# Patient Record
Sex: Male | Born: 1935 | Race: White | Hispanic: No | Marital: Married | Smoking: Former smoker
Health system: Southern US, Community
[De-identification: ages and names within clinical notes are randomized; demographics above are authoritative.]

## PROBLEM LIST (undated history)

## (undated) DIAGNOSIS — R0989 Other specified symptoms and signs involving the circulatory and respiratory systems: Secondary | ICD-10-CM

## (undated) DIAGNOSIS — D649 Anemia, unspecified: Secondary | ICD-10-CM

## (undated) DIAGNOSIS — D61818 Other pancytopenia: Secondary | ICD-10-CM

## (undated) DIAGNOSIS — M4800 Spinal stenosis, site unspecified: Secondary | ICD-10-CM

## (undated) DIAGNOSIS — I272 Pulmonary hypertension, unspecified: Secondary | ICD-10-CM

## (undated) DIAGNOSIS — C61 Malignant neoplasm of prostate: Secondary | ICD-10-CM

## (undated) DIAGNOSIS — I471 Supraventricular tachycardia: Secondary | ICD-10-CM

## (undated) DIAGNOSIS — J449 Chronic obstructive pulmonary disease, unspecified: Secondary | ICD-10-CM

## (undated) DIAGNOSIS — K219 Gastro-esophageal reflux disease without esophagitis: Secondary | ICD-10-CM

## (undated) DIAGNOSIS — D472 Monoclonal gammopathy: Secondary | ICD-10-CM

## (undated) DIAGNOSIS — R0609 Other forms of dyspnea: Secondary | ICD-10-CM

## (undated) DIAGNOSIS — E119 Type 2 diabetes mellitus without complications: Secondary | ICD-10-CM

## (undated) DIAGNOSIS — G629 Polyneuropathy, unspecified: Secondary | ICD-10-CM

## (undated) DIAGNOSIS — C9000 Multiple myeloma not having achieved remission: Secondary | ICD-10-CM

## (undated) DIAGNOSIS — K449 Diaphragmatic hernia without obstruction or gangrene: Secondary | ICD-10-CM

## (undated) DIAGNOSIS — N183 Chronic kidney disease, stage 3 (moderate): Secondary | ICD-10-CM

## (undated) DIAGNOSIS — I2510 Atherosclerotic heart disease of native coronary artery without angina pectoris: Secondary | ICD-10-CM

## (undated) DIAGNOSIS — I10 Essential (primary) hypertension: Secondary | ICD-10-CM

## (undated) DIAGNOSIS — M1990 Unspecified osteoarthritis, unspecified site: Secondary | ICD-10-CM

## (undated) HISTORY — PX: LAMINECTOMY: SHX219

## (undated) HISTORY — DX: Other forms of dyspnea: R06.09

## (undated) HISTORY — DX: Other specified symptoms and signs involving the circulatory and respiratory systems: R09.89

## (undated) HISTORY — PX: CORONARY ANGIOPLASTY: SHX604

## (undated) HISTORY — PX: CHOLECYSTECTOMY: SHX55

## (undated) HISTORY — PX: COLON SURGERY: SHX602

## (undated) HISTORY — DX: Other pancytopenia: D61.818

## (undated) HISTORY — DX: Monoclonal gammopathy: D47.2

## (undated) HISTORY — DX: Multiple myeloma not having achieved remission: C90.00

## (undated) HISTORY — PX: JOINT REPLACEMENT: SHX530

---

## 1997-07-17 ENCOUNTER — Other Ambulatory Visit: Admission: RE | Admit: 1997-07-17 | Discharge: 1997-07-17 | Payer: Self-pay | Admitting: *Deleted

## 2000-11-17 ENCOUNTER — Ambulatory Visit (HOSPITAL_COMMUNITY): Admission: RE | Admit: 2000-11-17 | Discharge: 2000-11-17 | Payer: Self-pay | Admitting: Gastroenterology

## 2000-11-17 ENCOUNTER — Encounter: Payer: Self-pay | Admitting: Gastroenterology

## 2000-12-29 ENCOUNTER — Encounter: Payer: Self-pay | Admitting: Gastroenterology

## 2000-12-29 ENCOUNTER — Encounter: Admission: RE | Admit: 2000-12-29 | Discharge: 2000-12-29 | Payer: Self-pay | Admitting: Gastroenterology

## 2001-02-08 ENCOUNTER — Emergency Department (HOSPITAL_COMMUNITY): Admission: EM | Admit: 2001-02-08 | Discharge: 2001-02-08 | Payer: Self-pay | Admitting: Emergency Medicine

## 2002-08-23 ENCOUNTER — Ambulatory Visit (HOSPITAL_COMMUNITY): Admission: RE | Admit: 2002-08-23 | Discharge: 2002-08-23 | Payer: Self-pay | Admitting: Neurology

## 2002-08-23 ENCOUNTER — Encounter: Payer: Self-pay | Admitting: Neurology

## 2004-08-08 ENCOUNTER — Ambulatory Visit: Payer: Self-pay | Admitting: Internal Medicine

## 2004-09-06 ENCOUNTER — Ambulatory Visit: Payer: Self-pay | Admitting: Internal Medicine

## 2004-09-09 ENCOUNTER — Ambulatory Visit: Payer: Self-pay | Admitting: Internal Medicine

## 2007-08-05 ENCOUNTER — Ambulatory Visit (HOSPITAL_COMMUNITY): Admission: RE | Admit: 2007-08-05 | Discharge: 2007-08-05 | Payer: Self-pay | Admitting: Internal Medicine

## 2007-08-25 ENCOUNTER — Ambulatory Visit: Payer: Self-pay | Admitting: Oncology

## 2007-09-06 LAB — CBC & DIFF AND RETIC
Eosinophils Absolute: 0.1 10*3/uL (ref 0.0–0.5)
HCT: 31.4 % — ABNORMAL LOW (ref 38.7–49.9)
IRF: 0.42 — ABNORMAL HIGH (ref 0.070–0.380)
LYMPH%: 30.9 % (ref 14.0–48.0)
MONO#: 0.5 10*3/uL (ref 0.1–0.9)
NEUT#: 3.4 10*3/uL (ref 1.5–6.5)
NEUT%: 59.1 % (ref 40.0–75.0)
Platelets: 208 10*3/uL (ref 145–400)
Retic %: 2.5 % — ABNORMAL HIGH (ref 0.7–2.3)
WBC: 5.8 10*3/uL (ref 4.0–10.0)
lymph#: 1.8 10*3/uL (ref 0.9–3.3)

## 2007-09-23 ENCOUNTER — Other Ambulatory Visit: Admission: RE | Admit: 2007-09-23 | Discharge: 2007-09-23 | Payer: Self-pay | Admitting: Oncology

## 2007-09-23 ENCOUNTER — Encounter: Payer: Self-pay | Admitting: Oncology

## 2007-09-23 ENCOUNTER — Ambulatory Visit: Payer: Self-pay | Admitting: Oncology

## 2008-07-19 DIAGNOSIS — R0609 Other forms of dyspnea: Secondary | ICD-10-CM

## 2008-07-19 DIAGNOSIS — I2510 Atherosclerotic heart disease of native coronary artery without angina pectoris: Secondary | ICD-10-CM | POA: Insufficient documentation

## 2008-07-19 DIAGNOSIS — R0989 Other specified symptoms and signs involving the circulatory and respiratory systems: Secondary | ICD-10-CM

## 2008-07-19 DIAGNOSIS — E119 Type 2 diabetes mellitus without complications: Secondary | ICD-10-CM | POA: Insufficient documentation

## 2008-07-19 DIAGNOSIS — C61 Malignant neoplasm of prostate: Secondary | ICD-10-CM | POA: Insufficient documentation

## 2008-07-19 DIAGNOSIS — I10 Essential (primary) hypertension: Secondary | ICD-10-CM

## 2008-07-19 DIAGNOSIS — IMO0002 Reserved for concepts with insufficient information to code with codable children: Secondary | ICD-10-CM | POA: Insufficient documentation

## 2008-07-19 HISTORY — DX: Other forms of dyspnea: R06.09

## 2008-07-20 ENCOUNTER — Ambulatory Visit: Payer: Self-pay | Admitting: Internal Medicine

## 2008-08-21 ENCOUNTER — Ambulatory Visit: Payer: Self-pay | Admitting: Internal Medicine

## 2010-01-02 ENCOUNTER — Encounter: Admission: RE | Admit: 2010-01-02 | Discharge: 2010-01-02 | Payer: Self-pay | Admitting: Orthopedic Surgery

## 2010-01-21 ENCOUNTER — Encounter: Admission: RE | Admit: 2010-01-21 | Discharge: 2010-01-21 | Payer: Self-pay | Admitting: Neurology

## 2010-07-22 ENCOUNTER — Encounter: Payer: Self-pay | Admitting: Oncology

## 2010-07-22 ENCOUNTER — Other Ambulatory Visit: Payer: Self-pay | Admitting: Oncology

## 2010-07-22 ENCOUNTER — Encounter (HOSPITAL_BASED_OUTPATIENT_CLINIC_OR_DEPARTMENT_OTHER): Payer: Medicare Other | Admitting: Oncology

## 2010-07-22 DIAGNOSIS — D472 Monoclonal gammopathy: Secondary | ICD-10-CM

## 2010-07-22 DIAGNOSIS — J438 Other emphysema: Secondary | ICD-10-CM

## 2010-07-22 DIAGNOSIS — E119 Type 2 diabetes mellitus without complications: Secondary | ICD-10-CM

## 2010-07-22 DIAGNOSIS — D649 Anemia, unspecified: Secondary | ICD-10-CM

## 2010-07-22 LAB — MORPHOLOGY: PLT EST: ADEQUATE

## 2010-07-22 LAB — CBC & DIFF AND RETIC
BASO%: 0.2 % (ref 0.0–2.0)
Basophils Absolute: 0 10*3/uL (ref 0.0–0.1)
EOS%: 1.1 % (ref 0.0–7.0)
HCT: 37 % — ABNORMAL LOW (ref 38.4–49.9)
HGB: 12.4 g/dL — ABNORMAL LOW (ref 13.0–17.1)
Immature Retic Fract: 12.9 % (ref 0.00–13.40)
LYMPH%: 25.4 % (ref 14.0–49.0)
MCH: 30.8 pg (ref 27.2–33.4)
MCHC: 33.5 g/dL (ref 32.0–36.0)
NEUT%: 68.6 % (ref 39.0–75.0)
Platelets: 183 10*3/uL (ref 140–400)

## 2010-07-23 LAB — ERYTHROPOIETIN: Erythropoietin: 39.2 m[IU]/mL — ABNORMAL HIGH (ref 2.6–34.0)

## 2010-07-23 LAB — IRON AND TIBC
%SAT: 21 % (ref 20–55)
TIBC: 332 ug/dL (ref 215–435)

## 2010-07-24 LAB — IMMUNOFIXATION ELECTROPHORESIS
IgA: 659 mg/dL — ABNORMAL HIGH (ref 68–378)
IgG (Immunoglobin G), Serum: 715 mg/dL (ref 694–1618)
Total Protein, Serum Electrophoresis: 6.7 g/dL (ref 6.0–8.3)

## 2010-07-26 NOTE — Consult Note (Signed)
[facility]  Patient:    Paul Burnett, Paul Burnett Visit Number: [id number] MRN: [id number]          Service Type: EMS Location: ED Attending Physician:  Benny Lennert Dictated by:   Peter M. Swazil[place], M.D. Proc. Date: 02/08/01 Admit Date:  02/08/2001 Discharge Date: 02/08/2001   CC:         Darci Needle, M.D.   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 Patient noted that his heart was racing.  He listened to his heart with a stethoscope and noted a heart rate of 200 that was regular.  He denied any dizziness, shortness of breath or chest pain.  He did have mild sweating.  He has had no history of syncope or near syncope.  Patient does report a history of SVT documented by [facility] monitor in the past.  He was started on bisoprolol six years ago after it was noted that he had a short burst of nonsustained ventricular tachycardia while he was hospitalized for prostate surgery.  Patient has had a history of coronary artery disease and has had previous angioplasty x 2, the last reported 10 years ago.  He had a negative stress Cardiolite two weeks ago.  PAST MEDICAL HISTORY:  Past medical history is significant for diabetes mellitus, type 2, hypertension and hypercholesterolemia.  PAST SURGICAL HISTORY:  He has had a prior prostatectomy for prostate CA.  CURRENT MEDICATIONS: 1. Bisoprolol 5 mg daily. 2. Multivitamin. 3. Vitamin E daily. 4. B12 folate and B6 daily. 5. Selenium daily. 6. A statin drug daily. 7. Cozaar daily.  ALLERGIES:  No known allergies.  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  He is a nonsmoker.  He avoids decongestants and caffeine.  He is married and his son Reita Cliche is an orthopedist here.  FAMILY HISTORY:   Noncontributory.  REVIEW OF SYSTEMS:  Patient does note some muscle weakness.  No history of TIA or stroke.  No known history of atrial fibrillation.  PHYSICAL EXAMINATION:  GENERAL:  Patient is a pleasant white male in no apparent distress.  VITAL SIGNS:  Blood pressure is 126/55.  Pulse ranges from 80 to 120. Respirations are 20.  He is afebrile.  HEENT:  Unremarkable.  NECK:  He has no JVD or bruits.  LUNGS:  Clear.  CARDIAC:  Regular rate and rhythm without gallops, murmurs or clicks.  ABDOMEN:  Soft and nontender without masses or hepatosplenomegaly.  EXTREMITIES:  Pulses are 2+ and symmetric.  He has no edema.  LABORATORY DATA:  CBC shows a white count of 6,700, hemoglobin 13, hematocrit 36.3, platelets 144,000.  Review of ECG and rhythm strips demonstrates normal sinus rhythm with frequent PACs and runs of ectopic atrial tachycardia.  It is associated with a different P wave morphology and more prolonged P-R segment.  Heart rate with ectopic atrial tachycardia typically runs 120 to 130.  Patient also has bifascicular block with left anterior fascicular block and right bundle branch block which is chronic.  IMPRESSION: 1. Ectopic atrial tachycardia. 2. Prior history of supraventricular tachycardia. 3. Coronary artery disease, status post angioplasty, with recent negative    Cardiolite study. 4. Hypertension. 5. Diabetes mellitus. 6. Hypercholesterolemia. 7. Chronic bifascicular block.     PLAN:  We will obtain TSH and BMET.  I have recommended increasing  his bisoprolol to 5 mg b.i.d.  I will follow up with Dr. Darci Needle p.r.n. Dictated by:   Peter M. Swaziland, M.D. Attending Physician:  Benny Lennert DD:  02/09/99 TD:  02/09/01 Job: [id number]

## 2010-11-13 ENCOUNTER — Other Ambulatory Visit (HOSPITAL_COMMUNITY): Payer: Self-pay | Admitting: Orthopedic Surgery

## 2010-11-13 ENCOUNTER — Ambulatory Visit (HOSPITAL_COMMUNITY)
Admission: RE | Admit: 2010-11-13 | Discharge: 2010-11-13 | Disposition: A | Discharge status: Home / Self Care | Payer: Medicare Other | Source: Ambulatory Visit | Attending: Orthopedic Surgery | Admitting: Orthopedic Surgery

## 2010-11-13 ENCOUNTER — Encounter (HOSPITAL_COMMUNITY)
Admission: RE | Admit: 2010-11-13 | Discharge: 2010-11-13 | Disposition: A | Discharge status: Home / Self Care | Payer: Medicare Other | Source: Ambulatory Visit | Attending: Orthopedic Surgery | Admitting: Orthopedic Surgery

## 2010-11-13 DIAGNOSIS — J4489 Other specified chronic obstructive pulmonary disease: Secondary | ICD-10-CM | POA: Insufficient documentation

## 2010-11-13 DIAGNOSIS — Z01812 Encounter for preprocedural laboratory examination: Secondary | ICD-10-CM | POA: Insufficient documentation

## 2010-11-13 DIAGNOSIS — M08951 Juvenile arthritis, unspecified, right hip: Secondary | ICD-10-CM

## 2010-11-13 DIAGNOSIS — J449 Chronic obstructive pulmonary disease, unspecified: Secondary | ICD-10-CM | POA: Insufficient documentation

## 2010-11-13 DIAGNOSIS — Z01818 Encounter for other preprocedural examination: Secondary | ICD-10-CM | POA: Insufficient documentation

## 2010-11-13 DIAGNOSIS — I517 Cardiomegaly: Secondary | ICD-10-CM | POA: Insufficient documentation

## 2010-11-13 LAB — URINALYSIS, ROUTINE W REFLEX MICROSCOPIC
Bilirubin Urine: NEGATIVE
Ketones, ur: NEGATIVE mg/dL
Nitrite: NEGATIVE
Urobilinogen, UA: 0.2 mg/dL (ref 0.0–1.0)

## 2010-11-13 LAB — COMPREHENSIVE METABOLIC PANEL
AST: 20 U/L (ref 0–37)
Alkaline Phosphatase: 159 U/L — ABNORMAL HIGH (ref 39–117)
BUN: 27 mg/dL — ABNORMAL HIGH (ref 6–23)
CO2: 29 mEq/L (ref 19–32)
Chloride: 102 mEq/L (ref 96–112)
Creatinine, Ser: 1.31 mg/dL (ref 0.50–1.35)
GFR calc non Af Amer: 52 mL/min — ABNORMAL LOW (ref >60)
Potassium: 4.1 mEq/L (ref 3.5–5.1)
Total Bilirubin: 0.8 mg/dL (ref 0.3–1.2)

## 2010-11-13 LAB — CBC
HCT: 39.2 % (ref 39.0–52.0)
MCV: 90.5 fL (ref 78.0–100.0)
Platelets: 169 10*3/uL (ref 150–400)
RBC: 4.33 MIL/uL (ref 4.22–5.81)
WBC: 6.2 10*3/uL (ref 4.0–10.5)

## 2010-11-13 LAB — ABO/RH: ABO/RH(D): AB POS

## 2010-11-13 LAB — APTT: aPTT: 29 seconds (ref 24–37)

## 2010-11-13 LAB — SURGICAL PCR SCREEN: Staphylococcus aureus: NEGATIVE

## 2010-11-18 ENCOUNTER — Inpatient Hospital Stay (HOSPITAL_COMMUNITY)
Admission: RE | Admit: 2010-11-18 | Discharge: 2010-11-20 | DRG: 470 | Disposition: A | Discharge status: Rehabilitation Facility | Payer: Medicare Other | Source: Ambulatory Visit | Attending: Orthopedic Surgery | Admitting: Orthopedic Surgery

## 2010-11-18 ENCOUNTER — Inpatient Hospital Stay (HOSPITAL_COMMUNITY): Payer: Medicare Other

## 2010-11-18 DIAGNOSIS — Z8546 Personal history of malignant neoplasm of prostate: Secondary | ICD-10-CM

## 2010-11-18 DIAGNOSIS — Z9861 Coronary angioplasty status: Secondary | ICD-10-CM

## 2010-11-18 DIAGNOSIS — I2510 Atherosclerotic heart disease of native coronary artery without angina pectoris: Secondary | ICD-10-CM | POA: Diagnosis present

## 2010-11-18 DIAGNOSIS — J4489 Other specified chronic obstructive pulmonary disease: Secondary | ICD-10-CM | POA: Diagnosis present

## 2010-11-18 DIAGNOSIS — N189 Chronic kidney disease, unspecified: Secondary | ICD-10-CM | POA: Diagnosis present

## 2010-11-18 DIAGNOSIS — M1610 Unilateral primary osteoarthritis, unspecified hip: Principal | ICD-10-CM | POA: Diagnosis present

## 2010-11-18 DIAGNOSIS — E119 Type 2 diabetes mellitus without complications: Secondary | ICD-10-CM | POA: Diagnosis present

## 2010-11-18 DIAGNOSIS — J449 Chronic obstructive pulmonary disease, unspecified: Secondary | ICD-10-CM | POA: Diagnosis present

## 2010-11-18 DIAGNOSIS — M169 Osteoarthritis of hip, unspecified: Principal | ICD-10-CM | POA: Diagnosis present

## 2010-11-18 HISTORY — PX: TOTAL HIP ARTHROPLASTY: SHX124

## 2010-11-18 LAB — GLUCOSE, CAPILLARY
Glucose-Capillary: 141 mg/dL — ABNORMAL HIGH (ref 70–99)
Glucose-Capillary: 166 mg/dL — ABNORMAL HIGH (ref 70–99)

## 2010-11-19 ENCOUNTER — Inpatient Hospital Stay (HOSPITAL_COMMUNITY): Payer: Medicare Other

## 2010-11-19 DIAGNOSIS — Z96649 Presence of unspecified artificial hip joint: Secondary | ICD-10-CM

## 2010-11-19 DIAGNOSIS — M1610 Unilateral primary osteoarthritis, unspecified hip: Secondary | ICD-10-CM

## 2010-11-19 LAB — GLUCOSE, CAPILLARY: Glucose-Capillary: 162 mg/dL — ABNORMAL HIGH (ref 70–99)

## 2010-11-19 LAB — CBC
HCT: 29.9 % — ABNORMAL LOW (ref 39.0–52.0)
Hemoglobin: 10.4 g/dL — ABNORMAL LOW (ref 13.0–17.0)
MCH: 30.9 pg (ref 26.0–34.0)
MCHC: 34.8 g/dL (ref 30.0–36.0)
MCV: 88.7 fL (ref 78.0–100.0)
RDW: 13.2 % (ref 11.5–15.5)

## 2010-11-19 LAB — BASIC METABOLIC PANEL
BUN: 17 mg/dL (ref 6–23)
Calcium: 8.8 mg/dL (ref 8.4–10.5)
Creatinine, Ser: 1.07 mg/dL (ref 0.50–1.35)
GFR calc Af Amer: >60 mL/min (ref >60)
GFR calc non Af Amer: >60 mL/min (ref >60)
Glucose, Bld: 173 mg/dL — ABNORMAL HIGH (ref 70–99)
Potassium: 3.7 mEq/L (ref 3.5–5.1)

## 2010-11-19 NOTE — Op Note (Signed)
NAME:  Paul Burnett, Paul Burnett NO.:  [id number]  MEDICAL RECORD NO.:  [id number]  LOCATION:  5025                         FACILITY:  [facility]  PHYSICIAN:  Loreta Ave, M.D. DATE OF BIRTH:  02/05/1925  DATE OF PROCEDURE:  11/18/2010 DATE OF DISCHARGE:                              OPERATIVE REPORT   PREOPERATIVE DIAGNOSIS:  Right hip end-stage degenerative arthritis with shortening and marked bony destruction femoral head.  POSTOPERATIVE DIAGNOSIS:  Right hip end-stage degenerative arthritis with shortening and marked bony destruction femoral head.  PROCEDURE:  Right total hip replacement, Stryker Secur-Fit plus prosthesis.  Press-Fit 56-mm acetabular component screw fixation x2.  40 mm internal diameter polyethylene liner.  A Press-Fit #8 Secur-Fit plus stem.  127-degree neck angle.  840-mm metallic head +7.5 mm.  SURGEON:  Loreta Ave, MD  ASSISTANT:  Zonia Kief, PA, present throughout the entire case necessary for timely completion of procedure.  ANESTHESIA:  General.  BLOOD LOSS:  100 mL.  BLOOD GIVEN:  None.  SPECIMENS:  None.  CULTURES:  None.  COMPLICATIONS:  None.  DRESSING:  Soft compressive with abduction pillow.  DESCRIPTION OF PROCEDURE:  The patient was brought to the operating room, placed on the operating table in supine position.  After adequate anesthesia has been obtained, turned to a lateral position.  Appropriate padding and support.  Right hip had virtually no rotation, at all very little flexion and abduction.  Prepped and draped in usual sterile fashion.  Incision along the shaft of femur extending posterosuperior. Skin and subcutaneous tissue divided.  Iliotibial band exposed and incised.  Charnley retractor put in place.  Neurovascular structures identified and protected.  External rotator capsule taken down off the back of the intertrochanteric groove.  Hip dislocated posteriorly. Femoral neck cut one fingerbreadth above  the lesser trochanter.  Femoral head removed.  Marked destruction collapse.  Reasonable bone stock, however.  Acetabulum exposed.  Labrum debrided.  Brought up bleeding bone.  Fitted with a 56-mm component at 45 degrees of abduction, 20 degrees of anteversion.  Good capturing fixation augmented with two screws through the cuff.  40-mm internal diameter liner was seated and inserted.  Attention turned to the femur.  Proximal and distal reamers brought up to good fitting.  Utilizing #8 prosthesis.  After appropriate trials, #8 prosthesis was placed, restoring as much anteversion as possible.  With a 40-mm +7.5 head, I had good restoration of leg lengths.  Excellent stability in flexion and extension, but he still was very stiff because of shortening from collapse.  Wound was irrigated. External rotator cuff repaired of the back of the intertrochanteric groove and tied over bony bridge.  An AP x-ray was obtained which showed a good leg lengths.  The iliotibial band closed with Vicryl.  Skin and subcutaneous tissue with staples.  Margins were injected with Marcaine. Sterile compressive dressing applied.  Returned to supine position. Anesthesia reversed.  Brought to the recovery room.  Tolerated surgery well.  No complications.     Loreta Ave, M.D.     DFM/MEDQ  D:  11/18/2010  T:  11/18/2010  Job:  [id number]  Electronically Signed by Mckinley Jewel M.D. on  11/19/2010 02:47:54 PM

## 2010-11-19 NOTE — Op Note (Signed)
  NAME:  Paul Burnett, Paul Burnett NO.:  [id number]  MEDICAL RECORD NO.:  [id number]  LOCATION:  5025                         FACILITY:  [facility]  PHYSICIAN:  Trenae Brunke I. Patsi Sears, M.D.DATE OF BIRTH:  December 22, 1924  DATE OF PROCEDURE:  11/18/2010 DATE OF DISCHARGE:                              OPERATIVE REPORT   PREOPERATIVE DIAGNOSIS:  Prostate cancer with a history of urethral stricture disease.  POSTOPERATIVE DIAGNOSIS:  Prostate cancer with a history of urethral stricture disease.  OPERATION:  Foley catheterization.  SURGEON:  Kashana Breach I. Patsi Sears, MD  ANESTHESIA:  General.  PREPARATION:  With the patient in the supine position, the penis was prepped with Betadine solution, draped in usual fashion.  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  However, he has some difficulty voiding, has been treated with Flomax.  He is now for knee replacement, and has a history of difficult Foley catheterization.  DESCRIPTION OF PROCEDURE:  Following prepping and draping of the penis, the penis was again prepped with Betadine solution as a second prep, and then 20 mL of Xylocaine jelly was placed in the urethra.  I was then able to take a 14 catheter, and a guide the catheter through the urethra, into the bladder.  I was able to recover urine which was clear in nature.  A 10 mL were placed in the Foley balloon without difficulty.  IMPRESSION:  Atraumatic Foley catheter placed in the operating room.  PLAN:  Continue Flomax, and call if the patient has difficulty voiding postop.     Montoya Watkin I. Patsi Sears, M.D.     SIT/MEDQ  D:  11/18/2010  T:  11/18/2010  Job:  [id number]  Electronically Signed by Jethro Bolus M.D. on 11/19/2010 04:58:02 PM

## 2010-11-20 ENCOUNTER — Inpatient Hospital Stay (HOSPITAL_COMMUNITY)
Admission: RE | Admit: 2010-11-20 | Discharge: 2010-11-29 | DRG: 945 | Disposition: A | Discharge status: Home Health | Payer: Medicare Other | Source: Other Acute Inpatient Hospital | Attending: Physical Medicine & Rehabilitation | Admitting: Physical Medicine & Rehabilitation

## 2010-11-20 DIAGNOSIS — N189 Chronic kidney disease, unspecified: Secondary | ICD-10-CM | POA: Diagnosis present

## 2010-11-20 DIAGNOSIS — J4489 Other specified chronic obstructive pulmonary disease: Secondary | ICD-10-CM | POA: Diagnosis present

## 2010-11-20 DIAGNOSIS — Z8546 Personal history of malignant neoplasm of prostate: Secondary | ICD-10-CM

## 2010-11-20 DIAGNOSIS — I959 Hypotension, unspecified: Secondary | ICD-10-CM | POA: Diagnosis not present

## 2010-11-20 DIAGNOSIS — K5900 Constipation, unspecified: Secondary | ICD-10-CM | POA: Diagnosis not present

## 2010-11-20 DIAGNOSIS — E785 Hyperlipidemia, unspecified: Secondary | ICD-10-CM | POA: Diagnosis present

## 2010-11-20 DIAGNOSIS — M169 Osteoarthritis of hip, unspecified: Secondary | ICD-10-CM | POA: Diagnosis present

## 2010-11-20 DIAGNOSIS — Z886 Allergy status to analgesic agent status: Secondary | ICD-10-CM

## 2010-11-20 DIAGNOSIS — Z87891 Personal history of nicotine dependence: Secondary | ICD-10-CM

## 2010-11-20 DIAGNOSIS — Z8719 Personal history of other diseases of the digestive system: Secondary | ICD-10-CM

## 2010-11-20 DIAGNOSIS — J189 Pneumonia, unspecified organism: Secondary | ICD-10-CM | POA: Diagnosis not present

## 2010-11-20 DIAGNOSIS — I129 Hypertensive chronic kidney disease with stage 1 through stage 4 chronic kidney disease, or unspecified chronic kidney disease: Secondary | ICD-10-CM | POA: Diagnosis present

## 2010-11-20 DIAGNOSIS — E119 Type 2 diabetes mellitus without complications: Secondary | ICD-10-CM | POA: Diagnosis present

## 2010-11-20 DIAGNOSIS — Z5189 Encounter for other specified aftercare: Principal | ICD-10-CM

## 2010-11-20 DIAGNOSIS — J449 Chronic obstructive pulmonary disease, unspecified: Secondary | ICD-10-CM | POA: Diagnosis present

## 2010-11-20 DIAGNOSIS — I2510 Atherosclerotic heart disease of native coronary artery without angina pectoris: Secondary | ICD-10-CM | POA: Diagnosis present

## 2010-11-20 DIAGNOSIS — Z7982 Long term (current) use of aspirin: Secondary | ICD-10-CM

## 2010-11-20 DIAGNOSIS — M1610 Unilateral primary osteoarthritis, unspecified hip: Secondary | ICD-10-CM | POA: Diagnosis present

## 2010-11-20 DIAGNOSIS — R339 Retention of urine, unspecified: Secondary | ICD-10-CM | POA: Diagnosis present

## 2010-11-20 DIAGNOSIS — Z9861 Coronary angioplasty status: Secondary | ICD-10-CM

## 2010-11-20 DIAGNOSIS — D649 Anemia, unspecified: Secondary | ICD-10-CM | POA: Diagnosis present

## 2010-11-20 LAB — CBC
Hemoglobin: 9.9 g/dL — ABNORMAL LOW (ref 13.0–17.0)
MCH: 31.2 pg (ref 26.0–34.0)
MCV: 89.6 fL (ref 78.0–100.0)
Platelets: 129 10*3/uL — ABNORMAL LOW (ref 150–400)
RBC: 3.17 MIL/uL — ABNORMAL LOW (ref 4.22–5.81)
WBC: 7 10*3/uL (ref 4.0–10.5)

## 2010-11-20 LAB — BASIC METABOLIC PANEL
CO2: 29 mEq/L (ref 19–32)
Calcium: 8.4 mg/dL (ref 8.4–10.5)
Chloride: 104 mEq/L (ref 96–112)
Creatinine, Ser: 1.25 mg/dL (ref 0.50–1.35)
Glucose, Bld: 134 mg/dL — ABNORMAL HIGH (ref 70–99)

## 2010-11-20 LAB — GLUCOSE, CAPILLARY
Glucose-Capillary: 132 mg/dL — ABNORMAL HIGH (ref 70–99)
Glucose-Capillary: 123 mg/dL — ABNORMAL HIGH (ref 70–99)

## 2010-11-21 DIAGNOSIS — Z96649 Presence of unspecified artificial hip joint: Secondary | ICD-10-CM

## 2010-11-21 DIAGNOSIS — M1610 Unilateral primary osteoarthritis, unspecified hip: Secondary | ICD-10-CM

## 2010-11-21 DIAGNOSIS — I2510 Atherosclerotic heart disease of native coronary artery without angina pectoris: Secondary | ICD-10-CM

## 2010-11-21 DIAGNOSIS — D62 Acute posthemorrhagic anemia: Secondary | ICD-10-CM

## 2010-11-21 LAB — GLUCOSE, CAPILLARY: Glucose-Capillary: 149 mg/dL — ABNORMAL HIGH (ref 70–99)

## 2010-11-22 ENCOUNTER — Inpatient Hospital Stay (HOSPITAL_COMMUNITY): Payer: Medicare Other

## 2010-11-22 DIAGNOSIS — M1610 Unilateral primary osteoarthritis, unspecified hip: Secondary | ICD-10-CM

## 2010-11-22 DIAGNOSIS — Z96649 Presence of unspecified artificial hip joint: Secondary | ICD-10-CM

## 2010-11-22 DIAGNOSIS — I2510 Atherosclerotic heart disease of native coronary artery without angina pectoris: Secondary | ICD-10-CM

## 2010-11-22 DIAGNOSIS — D62 Acute posthemorrhagic anemia: Secondary | ICD-10-CM

## 2010-11-22 LAB — COMPREHENSIVE METABOLIC PANEL
ALT: 6 U/L (ref 0–53)
Albumin: 2.4 g/dL — ABNORMAL LOW (ref 3.5–5.2)
Alkaline Phosphatase: 99 U/L (ref 39–117)
GFR calc Af Amer: >60 mL/min (ref >60)
Glucose, Bld: 121 mg/dL — ABNORMAL HIGH (ref 70–99)
Potassium: 3.5 mEq/L (ref 3.5–5.1)
Sodium: 139 mEq/L (ref 135–145)
Total Protein: 5.8 g/dL — ABNORMAL LOW (ref 6.0–8.3)

## 2010-11-22 LAB — URINALYSIS, ROUTINE W REFLEX MICROSCOPIC
Glucose, UA: NEGATIVE mg/dL
Specific Gravity, Urine: 1.02 (ref 1.005–1.030)
pH: 5 (ref 5.0–8.0)

## 2010-11-22 LAB — DIFFERENTIAL
Basophils Absolute: 0 10*3/uL (ref 0.0–0.1)
Basophils Relative: 0 % (ref 0–1)
Monocytes Absolute: 0.4 10*3/uL (ref 0.1–1.0)
Neutro Abs: 3.5 10*3/uL (ref 1.7–7.7)
Neutrophils Relative %: 65 % (ref 43–77)

## 2010-11-22 LAB — URINE MICROSCOPIC-ADD ON

## 2010-11-22 LAB — CBC
Hemoglobin: 9 g/dL — ABNORMAL LOW (ref 13.0–17.0)
MCHC: 34.7 g/dL (ref 30.0–36.0)

## 2010-11-23 LAB — CBC
HCT: 27.7 % — ABNORMAL LOW (ref 39.0–52.0)
MCHC: 35 g/dL (ref 30.0–36.0)
MCV: 89.1 fL (ref 78.0–100.0)
RDW: 13.5 % (ref 11.5–15.5)

## 2010-11-23 LAB — URINE CULTURE
Culture  Setup Time: 201209141714
Culture: NO GROWTH

## 2010-11-24 LAB — MAGNESIUM: Magnesium: 2.1 mg/dL (ref 1.5–2.5)

## 2010-11-24 LAB — BASIC METABOLIC PANEL
CO2: 28 mEq/L (ref 19–32)
Calcium: 8.7 mg/dL (ref 8.4–10.5)
Chloride: 97 mEq/L (ref 96–112)
GFR calc Af Amer: 51 mL/min — ABNORMAL LOW (ref >60)
Sodium: 137 mEq/L (ref 135–145)

## 2010-11-24 LAB — GLUCOSE, CAPILLARY: Glucose-Capillary: 109 mg/dL — ABNORMAL HIGH (ref 70–99)

## 2010-11-25 ENCOUNTER — Inpatient Hospital Stay (HOSPITAL_COMMUNITY): Payer: Medicare Other

## 2010-11-25 DIAGNOSIS — I2510 Atherosclerotic heart disease of native coronary artery without angina pectoris: Secondary | ICD-10-CM

## 2010-11-25 DIAGNOSIS — D62 Acute posthemorrhagic anemia: Secondary | ICD-10-CM

## 2010-11-25 DIAGNOSIS — Z96649 Presence of unspecified artificial hip joint: Secondary | ICD-10-CM

## 2010-11-25 DIAGNOSIS — M1610 Unilateral primary osteoarthritis, unspecified hip: Secondary | ICD-10-CM

## 2010-11-25 LAB — BASIC METABOLIC PANEL
BUN: 29 mg/dL — ABNORMAL HIGH (ref 6–23)
Calcium: 9.1 mg/dL (ref 8.4–10.5)
Chloride: 102 mEq/L (ref 96–112)
Creatinine, Ser: 1.11 mg/dL (ref 0.50–1.35)
GFR calc Af Amer: >60 mL/min (ref >60)
GFR calc non Af Amer: >60 mL/min (ref >60)

## 2010-11-25 LAB — GLUCOSE, CAPILLARY: Glucose-Capillary: 124 mg/dL — ABNORMAL HIGH (ref 70–99)

## 2010-11-25 LAB — CBC
Hemoglobin: 8.9 g/dL — ABNORMAL LOW (ref 13.0–17.0)
MCH: 31.1 pg (ref 26.0–34.0)
MCHC: 34.6 g/dL (ref 30.0–36.0)
Platelets: 202 10*3/uL (ref 150–400)
RDW: 13.3 % (ref 11.5–15.5)

## 2010-11-26 LAB — GLUCOSE, CAPILLARY: Glucose-Capillary: 111 mg/dL — ABNORMAL HIGH (ref 70–99)

## 2010-11-27 LAB — CBC
HCT: 28.8 % — ABNORMAL LOW (ref 39.0–52.0)
Hemoglobin: 9.7 g/dL — ABNORMAL LOW (ref 13.0–17.0)
MCH: 30.6 pg (ref 26.0–34.0)
MCHC: 33.7 g/dL (ref 30.0–36.0)
MCV: 90.9 fL (ref 78.0–100.0)
Platelets: 260 K/uL (ref 150–400)
RBC: 3.17 MIL/uL — ABNORMAL LOW (ref 4.22–5.81)
RDW: 13.3 % (ref 11.5–15.5)
WBC: 7 K/uL (ref 4.0–10.5)

## 2010-11-27 LAB — BASIC METABOLIC PANEL WITH GFR
BUN: 25 mg/dL — ABNORMAL HIGH (ref 6–23)
CO2: 30 meq/L (ref 19–32)
Calcium: 9.4 mg/dL (ref 8.4–10.5)
Chloride: 101 meq/L (ref 96–112)
Creatinine, Ser: 1.22 mg/dL (ref 0.50–1.35)
GFR calc Af Amer: >60 mL/min (ref >60)
GFR calc non Af Amer: 57 mL/min — ABNORMAL LOW (ref >60)
Glucose, Bld: 113 mg/dL — ABNORMAL HIGH (ref 70–99)
Potassium: 4.3 meq/L (ref 3.5–5.1)
Sodium: 140 meq/L (ref 135–145)

## 2010-11-27 LAB — GLUCOSE, CAPILLARY: Glucose-Capillary: 116 mg/dL — ABNORMAL HIGH (ref 70–99)

## 2010-11-28 LAB — GLUCOSE, CAPILLARY: Glucose-Capillary: 113 mg/dL — ABNORMAL HIGH (ref 70–99)

## 2010-11-28 NOTE — H&P (Signed)
NAME:  Paul Burnett, Paul Burnett NO.:  [id number]  MEDICAL RECORD NO.:  [id number]  LOCATION:  4011                         FACILITY:  [facility]  PHYSICIAN:  Ranelle Oyster, M.D.DATE OF BIRTH:  13-Aug-1924  DATE OF ADMISSION:  11/20/2010 DATE OF DISCHARGE:                             HISTORY & PHYSICAL   CHIEF COMPLAINTS:  Right hip pain.  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  He underwent a right total hip replacement on November 18, 2010, by Dr. Eulah Pont.  He is weightbearing as tolerated with posterior hip precautions.  Subcu Lovenox was placed for DVT prophylaxis. Postoperative pain control has been an issue.  Medications have been reduced a bit due to postoperative confusion and the patient has experienced some improvement.  He did develop postoperative anemia at 9.9 which is being followed.  The patient has struggled with his mobility and some safety issues.  Rehab was consulted and felt he could benefit from intensive inpatient rehab stay.  REVIEW OF SYSTEMS:  Notable for mild tremor, right hip pain, and angina. He has had occasional cough.  Full written review of systems is in the written H and P.  PAST MEDICAL HISTORY:  Positive for: 1. CAD with PTCA and PSVT. 2. History of non-insulin-requiring diabetes. 3. Peripheral neuropathy due to chemotherapy. 4. Prostate cancer. 5. Hypertension. 6. Intermittent tremor. 7. COPD. 8. Hyperlipidemia. 9. Chronic renal insufficiency with creatinine baseline 1.3. 10.Spinal stenosis with chronic radiculopathy. 11.Monoclonal gammopathy. 12.Duodenal ulcer. 13.Remote tobacco use.  He does not drink.  FAMILY HISTORY:  Positive for CAD.  SOCIAL  HISTORY:  The patient is living with his wife who is very limited and has a Comptroller 6 hours day.  Son is a Programmer, multimedia.  They have a 1- level house with 5 steps to enter.  Help is available as needed at home.  ALLERGIES:  DEMEROL.  HOME MEDICATIONS:  Losartan, atenolol, metformin, Zetia, aspirin 81 mg daily, Prilosec, Flomax, hydrochlorothiazide, vitamin B6 and 12, and vitamin C.  LABORATORY DATA:  Hemoglobin 9.9, white count 7000, and platelets 129. Sodium 139, potassium 3.9, BUN 19, and creatinine 1.25.  PHYSICAL EXAMINATION:  VITAL SIGNS:  Blood pressure 130/73, pulse 62, respiratory rate 18, and temperature 98.9. GENERAL:  The patient is generally pleasant, alert, in no acute distress. HEENT:  Ear, nose, and throat exam is notable for intact dentition, pink moist mucosa.  Pupils are equal, round, and reactive to light. CHEST:  Clear to auscultation bilaterally without any wheezes, rales, or rhonchi.  His air movement was a bit diminished. HEART:  Regular rate and rhythm without murmurs, rubs, or gallops. ABDOMEN:  Nontender with active bowel sounds. SKIN:  Notable for right hip incision which is clean and intact with staples and dressing.  The patient had some diminishment of his sensory function in the distal lower extremities mostly below the ankles, but this was very mild. NEUROLOGICAL:  Cranial nerves  were intact.  Reflexes were 2+.  Judgment, orientation, memory, and mood were all appropriate.  Strength is grossly 4/5 in the upper extremities, proximal and distal.  Right lower extremity is 1-2/5 proximally at the hip and knee with pain inhibition 4/5 distally.  Left lower extremity is 3/5 proximally to 4+/5 distally.  POSTADMISSION PHYSICIAN EVALUATION: 1. Functional deficits secondary to osteoarthritis of the right hip     failing conservative measures.  The patient is status post right     total hip replacement, postoperative day #2 today. 2. The patient is admitted  to receive collaborative interdisciplinary     care between the physiatrist, rehab nursing staff, and therapy     team. 3. The patient's level of medical complexity and substantial therapy     needs in context of that medical necessity cannot be provided at a     lesser intensity of care. 4. The patient has experienced substantial functional loss from his     baseline.  Premorbidly, he had been independent.  Currently, he is     total assist bed mobility, min to mod assist transfers, min assist     27 feet with rolling walker with cues, and max assist lower body     ADLs.  Judging by the patient's diagnosis, physical exam, and     functional history, he has a potential for functional progress     which will result in measurable gains while in inpatient rehab.     These gains will be of substantial and practical use upon discharge     to home in facilitating mobility and self-care.  Interim changes     since our consult are detailed above. 5. The physiatrist will provide 24-hour management of medical needs as     well as oversight of therapy plan/treatment and provide guidance as     appropriate regarding interaction of the two.  Medical problem list     and plan are below. 6. The 24-Hour Rehab Nursing team will assist in management of the     patient's skin care needs as well as pain management, nutrition,     bowel and bladder function, and integration of therapy concepts and     techniques. 7. PT will assess and treat for lower extremity strength, observance     of hip precautions, balance, safety, adaptive equipment, and     functional mobility with goals modified independent. 8. OT will assess and treat for upper extremity use, ADLs, adaptive     techniques, equipment, functional mobility, safety, upper extremity     strength, and caregiver education with goals modified independent     to setup. 9. Case Management and Social Worker will assess and treat for     psychosocial issues  and discharge planning. 10.Team conference will be held weekly to assess progress towards     goals and to determine barriers at discharge. 11.The patient has demonstrated sufficient medical stability and     exercise capacity to tolerate at least 3 hours of therapy per day     at least 5 days per week. 12.Estimated length stay is approximately 7-10 days.  Prognosis is     good.  The patient is quite motivated.  MEDICAL PROBLEM LIST AND PLAN: 1. Deep vein thrombosis prophylaxis.  Subcu Lovenox.  Likely duration     of approximately 2 weeks.  The patient is also on aspirin for his     home regimen which is currently on hold.  Follow platelets which     are only 129,000 today and we will observe for any active signs of     bleeding. 2. Pain management.  P.r.n. Percocet and Robaxin.  The patient seems     to be tolerating these fairly well.  We will observe for any mental     status changes with narcotics and muscle relaxants and titrate     regimen to effects. 3. Postoperative anemia:  We will check CBC on admission labs.  Most     recent lab was 9.9.  Add iron supplementation for anemia.  Observe     for effects upon therapy tolerance and overall activity. 4. Urine retention and history of prostate cancer.  We will check     PVRs.  Encourage out of bed voids and will continue Flomax. 5. Blood pressure control:  Tenormin, hydrochlorothiazide, and Cozaar.     We will monitor with increased activity.  Blood pressure is     normotensive today. 6. Diabetes mellitus type 2:  Glucophage 500 mg b.i.d.  Check CBGs     a.c. and at bedtime.  Ensure appropriate intake.  Follow for now     and adjust accordingly. 7. History of duodenal ulcer:  The patient is on Protonix.  Hold     aspirin as above. 8. Chronic renal insufficiency:  Follow BUN and creatinine as well as     regular weights.  Most recent creatinine was within his regular     range.     Ranelle Oyster, M.D.     ZTS/MEDQ   D:  11/20/2010  T:  11/21/2010  Job:  [id number]  cc:   Theressa Millard, M.D. Loreta Ave, M.D.  Electronically Signed by Faith Rogue M.D. on 11/28/2010 10:09:51 AM

## 2010-11-29 DIAGNOSIS — D62 Acute posthemorrhagic anemia: Secondary | ICD-10-CM

## 2010-11-29 DIAGNOSIS — I10 Essential (primary) hypertension: Secondary | ICD-10-CM

## 2010-11-29 DIAGNOSIS — I2510 Atherosclerotic heart disease of native coronary artery without angina pectoris: Secondary | ICD-10-CM

## 2010-11-29 DIAGNOSIS — M1610 Unilateral primary osteoarthritis, unspecified hip: Secondary | ICD-10-CM

## 2010-11-29 LAB — CULTURE, BLOOD (ROUTINE X 2)
Culture  Setup Time: 201209150323
Culture: NO GROWTH

## 2010-12-06 LAB — DIFFERENTIAL
Basophils Absolute: 0
Basophils Relative: 0
Lymphocytes Relative: 28
Neutro Abs: 3.4

## 2010-12-06 LAB — BONE MARROW EXAM

## 2010-12-06 LAB — TISSUE HYBRIDIZATION (BONE MARROW)-NCBH

## 2010-12-06 LAB — CBC
MCHC: 33.9
Platelets: 166
RDW: 15

## 2010-12-09 NOTE — Discharge Summary (Signed)
NAME:  Paul Burnett, Paul Burnett NO.:  [id number]  MEDICAL RECORD NO.:  [id number]  LOCATION:  4011                         FACILITY:  [facility]  PHYSICIAN:  Ranelle Oyster, M.D.DATE OF BIRTH:  1924-04-10  DATE OF ADMISSION:  11/20/2010 DATE OF DISCHARGE:  11/29/2010                              DISCHARGE SUMMARY   DISCHARGE DIAGNOSES: 1. Right total hip replacement secondary to degenerative changes,     November 18, 2010. 2. Subcutaneous Lovenox for deep vein thrombosis prophylaxis. 3. Pain management. 4. Postoperative anemia. 5. History of prostate cancer with urinary tension. 6. Hypertension. 7. Diabetes mellitus. 8. Left lower lobe pneumonia - resolved. 9. Hyperlipidemia. 10.History of duodenal ulcer. 11.Chronic renal insufficiency. 12.Constipation - resolved.  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  Underwent right total hip replacement on September 10 per Dr. Eulah Pont.  Weightbearing as tolerated with posterior hip precautions.  Subcutaneous Lovenox for deep vein thrombosis prophylaxis. Postoperative pain control with mild confusion, and monitored.  Anemia 9.9 with followup CBCs to monitor.  Minimal assist to ambulate, minimum to moderate assist for transfers.  The patient was admitted for comprehensive rehab program.  PAST MEDICAL HISTORY:  See discharge diagnoses.  Remote smoker.  No alcohol.  ALLERGIES:  DEMEROL.  SOCIAL HISTORY:  Married, retired Development worker, community.  Wife is limited as a Comptroller 6 hours a day.  Son is a Designer, multimedia physician, 1-level home, 5 steps to entry.  FUNCTIONAL HISTORY PRIOR TO ADMISSION:  Independent with a walker.  FUNCTIONAL STATUS UPON ADMISSION TO REHAB SERVICES:  +2 total assist bed mobility, minimum to moderate assist transfers, minimal assist to ambulate, 27 feet with a rolling walker, max assist for lower  body activities of daily living.  MEDICATIONS PRIOR TO ADMISSION: 1. Losartan 100 mg daily. 2. Atenolol 50 mg 1/2 tablet a.m., 50 mg p.m. 3. Glucophage 750 mg twice daily. 4. Zetia 5 mg daily. 5. Aspirin 81 mg every other day. 6. Prilosec 20 mg every other day. 7. Flomax 0.4 mg at bedtime. 8. Hydrochlorothiazide 12.5 mg daily. 9. Multivitamins daily.  PHYSICAL EXAM:  VITAL SIGNS:  Blood pressure 130/73, pulse 62, temperature 98.9, respirations 18. GENERAL:  This was an alert male in no acute distress, oriented x3. Deep tendon reflexes 2+.  Sensation intact.  Right hip incision clean and dry with clips. LUNGS:  Decreased breath sounds at bases without wheezes. CARDIAC:  Rate controlled. ABDOMEN:  Soft, nontender.  Good bowel sounds.  REHABILITATION HOSPITAL COURSE:  The patient was admitted to inpatient rehab services with therapies initiated on a 3-hour daily basis consisting of physical therapy, occupational therapy, and 24-hour rehabilitation nursing.  The following issues were addressed during the patient's rehabilitation stay.  Pertaining to Mr. [place]'s right total hip replacement secondary to degenerative changes, he would follow up with Dr. Mckinley Jewel of [facility].  He was weightbearing as tolerated with posterior hip precautions.  A surgical site was healing nicely.  Neurovascular sensation intact.  He was using Robaxin as well as Percocet for pain control.  Ultram was also added to his regimen at his discretion  with good results.  Postoperative anemia stable with latest hemoglobin of 9.7, guaiac study of stool was negative.  He was on iron supplement.  He had a history of urinary retention with prostate cancer.  He remained on Flomax.  He denied any dysuria, hematuria.  His urinalysis study was negative.  Blood pressures monitored.  Cozaar had been discontinued on September 18 due to some mild hypotension, and monitored.  On September 14, low grade fever  with cough.  Chest x-ray did suggest possible left lower lobe pneumonia.  He was placed on Rocephin with azithromycin.  This was changed to Levaquin, September 17. Followup chest x-ray resolved.  Left lower lobe infiltrate.  His Levaquin was discontinued, September 19.  He remained afebrile.  Blood sugars were well controlled.  He remained on low-dose Glucophage as well as diabetic diet.  Noted constipation.  Abdominal films showed retained stool.  He responded well to stool softeners with laxative use.  He did have some chronic renal insufficiency, creatinine 1.3 with latest creatinine 1.2 and monitored with fluid intakes.  The patient received weekly collaborative interdisciplinary team conferences to discuss estimated length of stay, family teaching at any barriers to discharge. He was supervision overall for activities daily living, supervision overall for his mobility, strength and endurance had greatly improved. He was encouraged with overall progress.  He exhibited no unsafe behavior.  Plans to be discharged to home with home health therapies as advised.  He was advised no driving at this time.  He remained on subcutaneous Lovenox throughout his rehab course.  He will complete a 14- day course on his discharge for a total of 14 days.  DISCHARGE MEDICATIONS: 1. Vitamin D 1000 units daily. 2. Glucophage 500 mg twice daily. 3. Flomax 0.4 mg at bedtime. 4. __________ capsule twice daily. 5. Lovenox 40 mg subcutaneous at bedtime x3 days. 6. Senokot tablets, 2 tablets p.o. at bedtime. 7. Tenormin 50 mg a.m., 25 mg p.m. 8. MiraLax as needed. 9. Robaxin 500 mg every 6 hours as needed spasms, dispense 90 tablets. 10.Ultram 50-100 mg every 6 hours as needed pain, dispense 90 tablets.  DIET:  Diabetic diet.  SPECIAL INSTRUCTIONS:  Weightbearing as tolerated.  Posterior hip precautions.  The patient to follow up with Dr. Mckinley Jewel in 2 weeks at the [facility], Dr. Faith Rogue at the outpatient rehab service office as needed.  Dr. Theressa Millard, medical management.     Mariam Dollar, P.A.   ______________________________ Ranelle Oyster, M.D.    DA/MEDQ  D:  11/28/2010  T:  11/28/2010  Job:  [id number]  cc:   Theressa Millard, M.D. Lyn Records, M.D. Loreta Ave, M.D. Sigmund I. Patsi Sears, M.D.  Electronically Signed by Mariam Dollar P.A. on 12/03/2010 12:32:43 PM Electronically Signed by Faith Rogue M.D. on 12/09/2010 12:20:57 PM

## 2011-01-10 ENCOUNTER — Other Ambulatory Visit: Payer: Self-pay | Admitting: Gastroenterology

## 2011-01-20 ENCOUNTER — Ambulatory Visit
Admission: RE | Admit: 2011-01-20 | Discharge: 2011-01-20 | Disposition: A | Discharge status: Home / Self Care | Payer: Medicare Other | Source: Ambulatory Visit | Attending: Gastroenterology | Admitting: Gastroenterology

## 2011-02-10 ENCOUNTER — Encounter (HOSPITAL_COMMUNITY): Payer: Self-pay | Admitting: *Deleted

## 2011-02-12 ENCOUNTER — Other Ambulatory Visit: Payer: Self-pay | Admitting: Gastroenterology

## 2011-02-13 ENCOUNTER — Encounter (HOSPITAL_COMMUNITY): Payer: Self-pay | Admitting: *Deleted

## 2011-02-13 ENCOUNTER — Ambulatory Visit (HOSPITAL_COMMUNITY)
Admission: RE | Admit: 2011-02-13 | Discharge: 2011-02-13 | Disposition: A | Discharge status: Home / Self Care | Payer: Medicare Other | Source: Ambulatory Visit | Attending: Gastroenterology | Admitting: Gastroenterology

## 2011-02-13 ENCOUNTER — Encounter (HOSPITAL_COMMUNITY)
Admission: RE | Disposition: A | Discharge status: Home / Self Care | Payer: Self-pay | Source: Ambulatory Visit | Attending: Gastroenterology

## 2011-02-13 DIAGNOSIS — E119 Type 2 diabetes mellitus without complications: Secondary | ICD-10-CM | POA: Insufficient documentation

## 2011-02-13 DIAGNOSIS — Z79899 Other long term (current) drug therapy: Secondary | ICD-10-CM | POA: Insufficient documentation

## 2011-02-13 DIAGNOSIS — J4489 Other specified chronic obstructive pulmonary disease: Secondary | ICD-10-CM | POA: Insufficient documentation

## 2011-02-13 DIAGNOSIS — Z8546 Personal history of malignant neoplasm of prostate: Secondary | ICD-10-CM | POA: Insufficient documentation

## 2011-02-13 DIAGNOSIS — I10 Essential (primary) hypertension: Secondary | ICD-10-CM | POA: Insufficient documentation

## 2011-02-13 DIAGNOSIS — G609 Hereditary and idiopathic neuropathy, unspecified: Secondary | ICD-10-CM | POA: Insufficient documentation

## 2011-02-13 DIAGNOSIS — R1310 Dysphagia, unspecified: Secondary | ICD-10-CM | POA: Insufficient documentation

## 2011-02-13 DIAGNOSIS — J449 Chronic obstructive pulmonary disease, unspecified: Secondary | ICD-10-CM | POA: Insufficient documentation

## 2011-02-13 DIAGNOSIS — I2789 Other specified pulmonary heart diseases: Secondary | ICD-10-CM | POA: Insufficient documentation

## 2011-02-13 DIAGNOSIS — Z923 Personal history of irradiation: Secondary | ICD-10-CM | POA: Insufficient documentation

## 2011-02-13 DIAGNOSIS — Z7982 Long term (current) use of aspirin: Secondary | ICD-10-CM | POA: Insufficient documentation

## 2011-02-13 HISTORY — DX: Pulmonary hypertension, unspecified: I27.20

## 2011-02-13 HISTORY — DX: Chronic obstructive pulmonary disease, unspecified: J44.9

## 2011-02-13 HISTORY — DX: Diaphragmatic hernia without obstruction or gangrene: K44.9

## 2011-02-13 HISTORY — DX: Essential (primary) hypertension: I10

## 2011-02-13 HISTORY — DX: Polyneuropathy, unspecified: G62.9

## 2011-02-13 HISTORY — DX: Spinal stenosis, site unspecified: M48.00

## 2011-02-13 HISTORY — DX: Unspecified osteoarthritis, unspecified site: M19.90

## 2011-02-13 HISTORY — DX: Atherosclerotic heart disease of native coronary artery without angina pectoris: I25.10

## 2011-02-13 HISTORY — DX: Anemia, unspecified: D64.9

## 2011-02-13 HISTORY — DX: Monoclonal gammopathy: D47.2

## 2011-02-13 HISTORY — DX: Gastro-esophageal reflux disease without esophagitis: K21.9

## 2011-02-13 SURGERY — ESOPHAGOGASTRODUODENOSCOPY (EGD) WITH ESOPHAGEAL DILATION
Anesthesia: Moderate Sedation

## 2011-02-13 MED ORDER — FENTANYL CITRATE 0.05 MG/ML IJ SOLN
INTRAMUSCULAR | Status: DC | PRN
  Administered 2011-02-13: 10 ug via INTRAVENOUS
  Administered 2011-02-13: 15 ug via INTRAVENOUS

## 2011-02-13 MED ORDER — FENTANYL CITRATE 0.05 MG/ML IJ SOLN
INTRAMUSCULAR | Status: AC
  Filled 2011-02-13: qty 2

## 2011-02-13 MED ORDER — MIDAZOLAM HCL 10 MG/2ML IJ SOLN
INTRAMUSCULAR | Status: AC
  Filled 2011-02-13: qty 2

## 2011-02-13 MED ORDER — BUTAMBEN-TETRACAINE-BENZOCAINE 2-2-14 % EX AERO
INHALATION_SPRAY | CUTANEOUS | Status: DC | PRN
  Administered 2011-02-13: 2 via TOPICAL

## 2011-02-13 MED ORDER — MIDAZOLAM HCL 10 MG/2ML IJ SOLN
INTRAMUSCULAR | Status: DC | PRN
  Administered 2011-02-13: 1 mg via INTRAVENOUS
  Administered 2011-02-13: 1.5 mg via INTRAVENOUS

## 2011-02-13 MED ORDER — SODIUM CHLORIDE 0.9 % IV SOLN
Freq: Once | INTRAVENOUS | Status: AC
  Administered 2011-02-13: 500 mL via INTRAVENOUS

## 2011-02-13 MED ORDER — CEFAZOLIN SODIUM 1-5 GM-% IV SOLN
1.0000 g | Freq: Once | INTRAVENOUS | Status: AC
  Administered 2011-02-13: 1 g via INTRAVENOUS
  Filled 2011-02-13: qty 50

## 2011-02-13 NOTE — Discharge Instructions (Addendum)
Endoscopy °Care After °Please read the instructions outlined below and refer to this sheet in the next few weeks. These discharge instructions provide you with general information on caring for yourself after you leave the hospital. Your doctor may also give you specific instructions. While your treatment has been planned according to the most current medical practices available, unavoidable complications occasionally occur. If you have any problems or questions after discharge, please call your doctor. °HOME CARE INSTRUCTIONS °Activity °· You may resume your regular activity but move at a slower pace for the next 24 hours.  °· Take frequent rest periods for the next 24 hours.  °· Walking will help expel (get rid of) the air and reduce the bloated feeling in your abdomen.  °· No driving for 24 hours (because of the anesthesia (medicine) used during the test).  °· You may shower.  °· Do not sign any important legal documents or operate any machinery for 24 hours (because of the anesthesia used during the test).  °Nutrition °· Drink plenty of fluids.  °· You may resume your normal diet.  °· Begin with a light meal and progress to your normal diet.  °· Avoid alcoholic beverages for 24 hours or as instructed by your caregiver.  °Medications °You may resume your normal medications unless your caregiver tells you otherwise. °What you can expect today °· You may experience abdominal discomfort such as a feeling of fullness or "gas" pains.  °· You may experience a sore throat for 2 to 3 days. This is normal. Gargling with salt water may help this.  °Follow-up °Your doctor will discuss the results of your test with you. °SEEK IMMEDIATE MEDICAL CARE IF: °· You have excessive nausea (feeling sick to your stomach) and/or vomiting.  °· You have severe abdominal pain and distention (swelling).  °· You have trouble swallowing.  °· You have a temperature over 100° F (37.8° C).  °· You have rectal bleeding or vomiting of blood.    °Document Released: 10/09/2003 Document Revised: 11/06/2010 Document Reviewed: 04/21/2007 °ExitCare® Patient Information ©2012 [employer]. °

## 2011-02-13 NOTE — Op Note (Signed)
Procedure: Diagnostic esophagogastroduodenoscopy with distal esophageal balloon dilation.  Endoscopist: Danise Edge  Premedication: Versed 2.5 mg. Fentanyl 25 mcg. Cetacaine spray.  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 He underwent a normal esophagogastroduodenoscopy and colonoscopy in 2002. He underwent a normal esophagogastroduodenoscopy with small bowel biopsies and 2007. He underwent a normal colonoscopy with random colon biopsies in 2007.  To evaluate esophageal dysphagia, the patient underwent a barium esophagram with barium tablet which shows narrowing in the distal esophagus.  The patient is scheduled to undergo a diagnostic esophagogastroduodenoscopy with balloon esophageal dilation.  Procedure: The patient was placed in the left lateral decubitus position. The Pentax gastroscope was passed through the posterior hypopharynx into the proximal esophagus without difficulty. The hypopharynx, larynx and vocal cords appeared normal.  Esophagoscopy: The proximal, mid, and lower segments of the esophageal mucosa appeared normal. The lower esophageal sphincter muscle was tightly contracted at the esophagogastric junction. With gentle pressure the endoscope out through the esophagogastric junction and into the proximal stomach. There was no endoscopic evidence for the presence of erosive esophagitis, Barrett's esophagus, or esophageal stricture formation. The balloon esophageal dilator was inflated to 16 mm at the esophagogastric junction. There was no mucosal stricture dilation after deflating the esophageal balloon. I suspect the patient has an esophageal motility disorder (poorly relaxing lower esophageal sphincter muscle or early achalasia) causing his intermittent esophageal dysphagia.  Gastroscopy: Retroflex view of the gastric cardia and fundus was normal. The gastric body, antrum, and pylorus appeared normal.  Duodenoscopy: The duodenal bulb and descending  duodenum appeared normal.  Assessment: Normal esophagogastroduodenoscopy except for a tight lower esophageal sphincter muscle. No endoscopic evidence for the presence of an esophageal stricture or esophageal tumor and no signs of mucosal stricture dilation after inflating the esophageal balloon to 16 mm at the esophagogastric junction. I suspect the patient has an esophageal motility disorder causing his intermittent esophageal dysphagia.

## 2011-02-13 NOTE — Procedures (Signed)
See dictated endoscopy report in the chart review tab.

## 2011-02-13 NOTE — H&P (Signed)
  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 To evaluate esophageal dysphagia, the patient underwent a barium esophagram with barium tablet which showed a moderate sized hiatal hernia associated with a distal esophageal stricture.  The patient is scheduled to undergo a diagnostic esophagogastroduodenoscopy with balloon dilation of the distal esophageal stricture.  Medication allergies: Demerol  Chronic medications: Omeprazole, lovastatin, atenolol, Cozaar, Flomax, metformin, baby aspirin, hydrochlorothiazide.  Past medical and surgical history: Normal esophagogastroduodenoscopy and 2002. Normal colonoscopy in 2002. Normal esophagogastroduodenoscopy with small bowel biopsies in 2007. Normal colonoscopy with random colon biopsies in 2007. Universal colonic diverticulosis. Surgery for sigmoid diverticular stricture. 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 Remote laparotomy with cholecystectomy. Type 2 diabetes mellitus. Hypertension. Spinal stenosis. Peripheral neuropathy. Chronic obstructive pulmonary disease with pulmonary hypertension. Monoclonal gammopathy. Tonsillectomy.  Habits: The patient quit smoking cigarettes in 1975. He does not consume alcohol.  Exam: The patient is alert and appears healthy. Sclera are nonicteric. Mouth and throat appear normal. Cardiac exam reveals a regular rhythm. Lungs are clear. Abdomen is soft, flat, and nontender to palpation in all quadrants.  Recommendations: Proceed with diagnostic esophagogastroduodenoscopy with balloon esophageal dilation of the distal esophageal stricture.

## 2011-07-30 ENCOUNTER — Other Ambulatory Visit: Payer: Self-pay | Admitting: Dermatology

## 2012-11-15 ENCOUNTER — Encounter (HOSPITAL_COMMUNITY): Payer: Medicare Other

## 2012-11-15 ENCOUNTER — Other Ambulatory Visit (HOSPITAL_COMMUNITY): Payer: Medicare Other

## 2012-12-15 ENCOUNTER — Other Ambulatory Visit (HOSPITAL_COMMUNITY): Payer: Medicare Other

## 2012-12-15 ENCOUNTER — Encounter (HOSPITAL_COMMUNITY): Payer: Medicare Other

## 2012-12-16 ENCOUNTER — Other Ambulatory Visit (HOSPITAL_COMMUNITY): Payer: Self-pay | Admitting: Interventional Cardiology

## 2012-12-16 ENCOUNTER — Ambulatory Visit (HOSPITAL_COMMUNITY): Payer: Medicare Other | Attending: Internal Medicine | Admitting: Radiology

## 2012-12-16 ENCOUNTER — Other Ambulatory Visit (HOSPITAL_COMMUNITY): Payer: Self-pay | Admitting: Internal Medicine

## 2012-12-16 ENCOUNTER — Ambulatory Visit (HOSPITAL_BASED_OUTPATIENT_CLINIC_OR_DEPARTMENT_OTHER): Payer: Medicare Other | Admitting: Radiology

## 2012-12-16 VITALS — BP 146/75 | Ht 67.0 in | Wt 148.0 lb

## 2012-12-16 DIAGNOSIS — I25812 Atherosclerosis of bypass graft of coronary artery of transplanted heart without angina pectoris: Secondary | ICD-10-CM

## 2012-12-16 DIAGNOSIS — I2510 Atherosclerotic heart disease of native coronary artery without angina pectoris: Secondary | ICD-10-CM

## 2012-12-16 DIAGNOSIS — E119 Type 2 diabetes mellitus without complications: Secondary | ICD-10-CM | POA: Insufficient documentation

## 2012-12-16 DIAGNOSIS — R079 Chest pain, unspecified: Secondary | ICD-10-CM

## 2012-12-16 DIAGNOSIS — J4489 Other specified chronic obstructive pulmonary disease: Secondary | ICD-10-CM

## 2012-12-16 DIAGNOSIS — I2789 Other specified pulmonary heart diseases: Secondary | ICD-10-CM

## 2012-12-16 DIAGNOSIS — J449 Chronic obstructive pulmonary disease, unspecified: Secondary | ICD-10-CM

## 2012-12-16 DIAGNOSIS — I10 Essential (primary) hypertension: Secondary | ICD-10-CM | POA: Insufficient documentation

## 2012-12-16 DIAGNOSIS — I25810 Atherosclerosis of coronary artery bypass graft(s) without angina pectoris: Secondary | ICD-10-CM

## 2012-12-16 MED ORDER — TECHNETIUM TC 99M SESTAMIBI GENERIC - CARDIOLITE
30.0000 | Freq: Once | INTRAVENOUS | Status: AC | PRN
  Administered 2012-12-16: 30 via INTRAVENOUS

## 2012-12-16 MED ORDER — TECHNETIUM TC 99M SESTAMIBI GENERIC - CARDIOLITE
10.0000 | Freq: Once | INTRAVENOUS | Status: AC | PRN
  Administered 2012-12-16: 10 via INTRAVENOUS

## 2012-12-16 MED ORDER — REGADENOSON 0.4 MG/5ML IV SOLN
0.4000 mg | Freq: Once | INTRAVENOUS | Status: AC
  Administered 2012-12-16: 0.4 mg via INTRAVENOUS

## 2012-12-16 NOTE — Progress Notes (Signed)
[facility] SITE 3 NUCLEAR MED [address] [place], [place] [address] [phone]    Cardiology Nuclear Med Study  Paul Burnett is a 79 y.o. male     MRN : [id number]     DOB: [birth year]  Procedure Date: 12/16/2012  Nuclear Med Background Indication for Stress Test:  Evaluation for Ischemia and PTCA Patency History:  2012 MPS Normal EF 63%, 2010 Echo EF 55-60% > 30 yrs  PTCA Cardiac Risk Factors: Family History - CAD, History of Smoking, Hypertension, Lipids and NIDDM  Symptoms:  Chest Pain   Nuclear Pre-Procedure Caffeine/Decaff Intake:  None NPO After: 6:30 pm   Lungs:  clear O2 Sat: 96% on room air. IV 0.9% NS with Angio Cath:  22g  IV Site: R Forearm  IV Started by:  Bonnita Levan, RN  Chest Size (in):  44 Cup Size: n/a  Height: 5\' 7"  (1.702 m)  Weight:  148 lb (67.132 kg)  BMI:  Body mass index is 23.17 kg/(m^2). Tech Comments:  Took Atenolol this AM    Nuclear Med Study 1 or 2 day study: 1 day  Stress Test Type:  Lexiscan  Reading MD: Verdis Prime, MD  Order Authorizing Provider:  Verdis Prime, MD  Resting Radionuclide: Technetium 66m Sestamibi  Resting Radionuclide Dose: 10.9 mCi   Stress Radionuclide:  Technetium 42m Sestamibi  Stress Radionuclide Dose: 32.8 mCi           Stress Protocol Rest HR: 54 Stress HR: 76  Rest BP: 146/75 Stress BP: 147/74  Exercise Time (min): n/a METS: n/a   Predicted Max HR: 134 bpm % Max HR: 56.72 bpm Rate Pressure Product: 27253   Dose of Adenosine (mg):  n/a Dose of Lexiscan: 0.4 mg  Dose of Atropine (mg): n/a Dose of Dobutamine: n/a mcg/kg/min (at max HR)  Stress Test Technologist: Milana Na, EMT-P  Nuclear Technologist:  Doyne Keel, CNMT     Rest Procedure:  Myocardial perfusion imaging was performed at rest 45 minutes following the intravenous administration of Technetium 22m Sestamibi. Rest ECG: LAFB and RBBB  Stress Procedure:  The patient received IV Lexiscan 0.4 mg over 15-seconds.  Technetium  57m Sestamibi injected at 30-seconds. This patient had sob with the Lexiscan injection.Quantitative spect images were obtained after a 45 minute delay. Stress ECG: No significant change from baseline ECG  QPS Raw Data Images:  Normal; no motion artifact; normal heart/lung ratio. Stress Images:  Normal homogeneous uptake in all areas of the myocardium. Rest Images:  Normal homogeneous uptake in all areas of the myocardium. Subtraction (SDS):  No evidence of ischemia. Transient Ischemic Dilatation (Normal <1.22):  N/A Lung/Heart Ratio (Normal <0.45):  0.35  Quantitative Gated Spect Images QGS EDV:  76 ml QGS ESV:  24 ml  Impression Exercise Capacity:  Lexiscan with no exercise. BP Response:  Normal blood pressure response. Clinical Symptoms:  There is dyspnea. ECG Impression:  No significant ST segment change suggestive of ischemia. Comparison with Prior Nuclear Study: No significant change from previous study  Overall Impression:  Normal stress nuclear study.  LV Ejection Fraction: 69%.  LV Wall Motion:  NL LV Function; NL Wall Motion

## 2012-12-16 NOTE — Progress Notes (Signed)
Echocardiogram performed.  

## 2013-02-01 ENCOUNTER — Ambulatory Visit (INDEPENDENT_AMBULATORY_CARE_PROVIDER_SITE_OTHER): Payer: Medicare Other

## 2013-02-01 VITALS — BP 151/79 | HR 67 | Resp 28 | Ht 67.0 in | Wt 145.0 lb

## 2013-02-01 DIAGNOSIS — L608 Other nail disorders: Secondary | ICD-10-CM

## 2013-02-01 DIAGNOSIS — E1142 Type 2 diabetes mellitus with diabetic polyneuropathy: Secondary | ICD-10-CM

## 2013-02-01 DIAGNOSIS — E1140 Type 2 diabetes mellitus with diabetic neuropathy, unspecified: Secondary | ICD-10-CM

## 2013-02-01 DIAGNOSIS — E1149 Type 2 diabetes mellitus with other diabetic neurological complication: Secondary | ICD-10-CM

## 2013-02-01 NOTE — Progress Notes (Signed)
  Subjective:    Patient ID: Paul Burnett, male    DOB: Sep 10, 1924, 79 y.o.   MRN: [id number]  "Routine follow-up."  HPI patient does have a history of diabetes with peripheral neuropathy. Significant hypersensitivity in the toes is noted on palpation and debridement at this time to    Review of Systems deferred at this visit     Objective:   Physical Exam Vascular status is intact although diminished pedal pulses PT nonpalpable bilateral 04 intact dorsalis pedis +2/4 bilateral. Capillary refill timed 3-4 seconds bilateral skin temperature warm turgor diminished patient has multiple lesions lacerations on his anterior shin his legs and ankles.. Fragile paperthin skin noted neurologically epicritic and proprioceptive sensations diminished on Semmes Weinstein testing to the forefoot and digits there is normal plantar response DTRs not elicited. Dermatologically skin color pigment normal hair growth absent nails criptotic incurvated friable tender on palpation and attempted debridement no secondary infection is are noted. Orthopedic biomechanical exam unremarkable noncontributory no fractures or other osseous abnormalities are noted mild semirigid digital contractures are noted 2 through 5.     Assessment & Plan:  Assessment this time diabetes with peripheral neuropathy. Dystrophic friable mycotic nails debrided x10 and presence of diabetes and complicating factors return for future palliative care and as-needed basis suggest a 3 month followup  Alvan Dame DPM

## 2013-02-01 NOTE — Patient Instructions (Signed)
Diabetes and Foot Care Diabetes may cause you to have problems because of poor blood supply (circulation) to your feet and legs. This may cause the skin on your feet to become thinner, break easier, and heal more slowly. Your skin may become dry, and the skin may peel and crack. You may also have nerve damage in your legs and feet causing decreased feeling in them. You may not notice minor injuries to your feet that could lead to infections or more serious problems. Taking care of your feet is one of the most important things you can do for yourself.  HOME CARE INSTRUCTIONS  Wear shoes at all times, even in the house. Do not go barefoot. Bare feet are easily injured.  Check your feet daily for blisters, cuts, and redness. If you cannot see the bottom of your feet, use a mirror or ask someone for help.  Wash your feet with warm water (do not use hot water) and mild soap. Then pat your feet and the areas between your toes until they are completely dry. Do not soak your feet as this can dry your skin.  Apply a moisturizing lotion or petroleum jelly (that does not contain alcohol and is unscented) to the skin on your feet and to dry, brittle toenails. Do not apply lotion between your toes.  Trim your toenails straight across. Do not dig under them or around the cuticle. File the edges of your nails with an emery board or nail file.  Do not cut corns or calluses or try to remove them with medicine.  Wear clean socks or stockings every day. Make sure they are not too tight. Do not wear knee-high stockings since they may decrease blood flow to your legs.  Wear shoes that fit properly and have enough cushioning. To break in new shoes, wear them for just a few hours a day. This prevents you from injuring your feet. Always look in your shoes before you put them on to be sure there are no objects inside.  Do not cross your legs. This may decrease the blood flow to your feet.  If you find a minor scrape,  cut, or break in the skin on your feet, keep it and the skin around it clean and dry. These areas may be cleansed with mild soap and water. Do not cleanse the area with peroxide, alcohol, or iodine.  When you remove an adhesive bandage, be sure not to damage the skin around it.  If you have a wound, look at it several times a day to make sure it is healing.  Do not use heating pads or hot water bottles. They may burn your skin. If you have lost feeling in your feet or legs, you may not know it is happening until it is too late.  Make sure your health care provider performs a complete foot exam at least annually or more often if you have foot problems. Report any cuts, sores, or bruises to your health care provider immediately. SEEK MEDICAL CARE IF:   You have an injury that is not healing.  You have cuts or breaks in the skin.  You have an ingrown nail.  You notice redness on your legs or feet.  You feel burning or tingling in your legs or feet.  You have pain or cramps in your legs and feet.  Your legs or feet are numb.  Your feet always feel cold. SEEK IMMEDIATE MEDICAL CARE IF:   There is increasing redness,   swelling, or pain in or around a wound.  There is a red line that goes up your leg.  Pus is coming from a wound.  You develop a fever or as directed by your health care provider.  You notice a bad smell coming from an ulcer or wound. Document Released: 02/22/2000 Document Revised: 10/27/2012 Document Reviewed: 08/03/2012 ExitCare Patient Information 2014 [employer].  

## 2013-07-26 ENCOUNTER — Other Ambulatory Visit: Payer: Self-pay | Admitting: Nephrology

## 2013-07-26 DIAGNOSIS — I4891 Unspecified atrial fibrillation: Secondary | ICD-10-CM

## 2013-07-26 DIAGNOSIS — N183 Chronic kidney disease, stage 3 unspecified: Secondary | ICD-10-CM

## 2013-07-26 DIAGNOSIS — I10 Essential (primary) hypertension: Secondary | ICD-10-CM

## 2013-08-02 ENCOUNTER — Ambulatory Visit
Admission: RE | Admit: 2013-08-02 | Discharge: 2013-08-02 | Disposition: A | Discharge status: Home / Self Care | Payer: Medicare Other | Source: Ambulatory Visit | Attending: Nephrology | Admitting: Nephrology

## 2013-08-02 DIAGNOSIS — I10 Essential (primary) hypertension: Secondary | ICD-10-CM

## 2013-08-02 DIAGNOSIS — N183 Chronic kidney disease, stage 3 unspecified: Secondary | ICD-10-CM

## 2013-08-02 DIAGNOSIS — I4891 Unspecified atrial fibrillation: Secondary | ICD-10-CM

## 2013-08-09 ENCOUNTER — Ambulatory Visit (INDEPENDENT_AMBULATORY_CARE_PROVIDER_SITE_OTHER): Payer: Medicare Other

## 2013-08-09 VITALS — Wt 142.0 lb

## 2013-08-09 DIAGNOSIS — L608 Other nail disorders: Secondary | ICD-10-CM

## 2013-08-09 DIAGNOSIS — E1142 Type 2 diabetes mellitus with diabetic polyneuropathy: Secondary | ICD-10-CM

## 2013-08-09 DIAGNOSIS — E1140 Type 2 diabetes mellitus with diabetic neuropathy, unspecified: Secondary | ICD-10-CM

## 2013-08-09 DIAGNOSIS — E1149 Type 2 diabetes mellitus with other diabetic neurological complication: Secondary | ICD-10-CM

## 2013-08-09 NOTE — Progress Notes (Signed)
   Subjective:    Patient ID: Paul Burnett, male    DOB: 1924-10-02, 80 y.o.   MRN: [id number]  HPI Comments: Pt request trimming of 1 - 10 toenails.     Review of Systems no new systemic changes or findings noted     Objective:   Physical Exam Neurovascular status is intact pedal pulses are diminished PT nonpalpable bilateral zero over four DP pulse two over four bilateral capillary refill timed 3-4 seconds all digits skin temperature warm to cool turgor diminished dry scaling skin and fissuring the skin is noted nails thick brittle chromic friable dystrophic and criptotic with incurvation patient does have a history of diabetes and complications in neuropathy decreased epicritic sensation confirmed on Semmes Weinstein testing and up into the forefoot and digits. No open wounds ulcerations no secondary infection is noted at this time.       Assessment & Plan:  Assessment diabetes with peripheral neuropathy as angiopathy thick and brittle crumbly mycotic nails painful tender symptomatic nails 1 through 5 bilateral debridement at this time the presence of pain in symptomology as well as diabetes and complications return in 3 months for continued palliative care in the future as needed  Harriet Masson DPM

## 2014-01-10 ENCOUNTER — Ambulatory Visit: Payer: Medicare Other

## 2014-01-16 ENCOUNTER — Ambulatory Visit (INDEPENDENT_AMBULATORY_CARE_PROVIDER_SITE_OTHER): Payer: Medicare Other | Admitting: Podiatry

## 2014-01-16 DIAGNOSIS — B351 Tinea unguium: Secondary | ICD-10-CM

## 2014-01-16 DIAGNOSIS — M79673 Pain in unspecified foot: Secondary | ICD-10-CM

## 2014-01-17 ENCOUNTER — Ambulatory Visit: Payer: Medicare Other

## 2014-01-17 NOTE — Progress Notes (Signed)
Subjective:     Patient ID: Paul Burnett, male   DOB: [birth year], 80 y.o.   MRN: [id number]  HPIpatient presents with nail disease 1-5 both feet that are thick and painful and he cannot cut   Review of Systems     Objective:   Physical Exam Neurovascular status intact with thick yellow brittle nailbeds 1-5 both feet    Assessment:     Mycotic nail infections with pain 1-5 both feet    Plan:     Debride painful nailbeds 1-5 both feet with no iatrogenic bleeding noted

## 2014-01-31 ENCOUNTER — Encounter: Payer: Self-pay | Admitting: Oncology

## 2014-01-31 ENCOUNTER — Other Ambulatory Visit: Payer: Self-pay | Admitting: Oncology

## 2014-01-31 DIAGNOSIS — D472 Monoclonal gammopathy: Secondary | ICD-10-CM

## 2014-01-31 HISTORY — DX: Monoclonal gammopathy: D47.2

## 2014-02-01 ENCOUNTER — Telehealth: Payer: Self-pay | Admitting: Oncology

## 2014-02-01 NOTE — Telephone Encounter (Signed)
Lft msg for pt to come by our office today 11/25 to p/u 24 hour urine jug and to return with jug back on Monday 11/30 per 11/24 POF.... Cherylann Banas

## 2014-02-01 NOTE — Telephone Encounter (Signed)
Called pt back to advise of lab apt also scheduled for 11:30 and to drop off urine jug same day per 11/24 POF..... KJ

## 2014-02-06 ENCOUNTER — Other Ambulatory Visit (HOSPITAL_BASED_OUTPATIENT_CLINIC_OR_DEPARTMENT_OTHER): Payer: Medicare Other

## 2014-02-06 DIAGNOSIS — D472 Monoclonal gammopathy: Secondary | ICD-10-CM

## 2014-02-06 LAB — BASIC METABOLIC PANEL (CC13)
ANION GAP: 11 meq/L (ref 3–11)
BUN: 38.6 mg/dL — ABNORMAL HIGH (ref 7.0–26.0)
CALCIUM: 9.5 mg/dL (ref 8.4–10.4)
CO2: 26 mEq/L (ref 22–29)
Chloride: 108 mEq/L (ref 98–109)
Creatinine: 1.9 mg/dL — ABNORMAL HIGH (ref 0.7–1.3)
GLUCOSE: 125 mg/dL (ref 70–140)
POTASSIUM: 4.4 meq/L (ref 3.5–5.1)
SODIUM: 144 meq/L (ref 136–145)

## 2014-02-08 LAB — KAPPA/LAMBDA LIGHT CHAINS, FREE, WITH RATIO, 24HR. URINE
KAPPA Ligh Chain, Free U: 67.3 mg/L — ABNORMAL HIGH (ref 1.35–24.19)
KAPPA/LAMBDA, FREE RATIO: 23.61 — AB (ref 2.04–10.37)
LAMBDA LIGHT CHAIN, FREE U: 2.85 mg/L (ref 0.24–6.66)

## 2014-02-10 LAB — UIFE/LIGHT CHAINS/TP QN, 24-HR UR
ALBUMIN, U: DETECTED
ALPHA 1 UR: DETECTED — AB
ALPHA 2 UR: DETECTED — AB
Beta, Urine: DETECTED — AB
GAMMA UR: DETECTED — AB
TIME-UPE24: 24 h
TOTAL PROTEIN, URINE-UPE24: 7 mg/dL (ref 5–25)
Total Protein, Urine-Ur/day: 84 mg/d (ref <150)
Volume, Urine: 1200 mL

## 2014-02-10 LAB — CREATININE CLEARANCE, URINE, 24 HOUR
CREAT CLEAR: 42 mL/min — AB (ref 75–125)
CREATININE, URINE: 90.5 mg/dL
CREATININE: 1.81 mg/dL — AB (ref 0.50–1.35)
Collection Interval-CRCL: 24 hours
Creatinine, 24H Ur: 1086 mg/d (ref 800–2000)
Urine Total Volume-CRCL: 1200 mL

## 2014-05-17 ENCOUNTER — Inpatient Hospital Stay (HOSPITAL_COMMUNITY)
Admission: EM | Admit: 2014-05-17 | Discharge: 2014-05-19 | DRG: 308 | Disposition: A | Discharge status: Home / Self Care | Payer: Medicare Other | Attending: Internal Medicine | Admitting: Internal Medicine

## 2014-05-17 ENCOUNTER — Encounter (HOSPITAL_COMMUNITY): Payer: Self-pay | Admitting: Family Medicine

## 2014-05-17 ENCOUNTER — Emergency Department (HOSPITAL_COMMUNITY): Payer: Medicare Other

## 2014-05-17 DIAGNOSIS — G629 Polyneuropathy, unspecified: Secondary | ICD-10-CM | POA: Diagnosis present

## 2014-05-17 DIAGNOSIS — Z9049 Acquired absence of other specified parts of digestive tract: Secondary | ICD-10-CM | POA: Diagnosis present

## 2014-05-17 DIAGNOSIS — I959 Hypotension, unspecified: Secondary | ICD-10-CM | POA: Diagnosis present

## 2014-05-17 DIAGNOSIS — R531 Weakness: Secondary | ICD-10-CM | POA: Diagnosis present

## 2014-05-17 DIAGNOSIS — J449 Chronic obstructive pulmonary disease, unspecified: Secondary | ICD-10-CM | POA: Diagnosis present

## 2014-05-17 DIAGNOSIS — D696 Thrombocytopenia, unspecified: Secondary | ICD-10-CM | POA: Diagnosis present

## 2014-05-17 DIAGNOSIS — I10 Essential (primary) hypertension: Secondary | ICD-10-CM

## 2014-05-17 DIAGNOSIS — Z7982 Long term (current) use of aspirin: Secondary | ICD-10-CM

## 2014-05-17 DIAGNOSIS — N183 Chronic kidney disease, stage 3 unspecified: Secondary | ICD-10-CM | POA: Diagnosis present

## 2014-05-17 DIAGNOSIS — M1990 Unspecified osteoarthritis, unspecified site: Secondary | ICD-10-CM | POA: Diagnosis present

## 2014-05-17 DIAGNOSIS — J189 Pneumonia, unspecified organism: Secondary | ICD-10-CM | POA: Diagnosis present

## 2014-05-17 DIAGNOSIS — I272 Other secondary pulmonary hypertension: Secondary | ICD-10-CM | POA: Diagnosis present

## 2014-05-17 DIAGNOSIS — Z955 Presence of coronary angioplasty implant and graft: Secondary | ICD-10-CM | POA: Diagnosis not present

## 2014-05-17 DIAGNOSIS — R509 Fever, unspecified: Secondary | ICD-10-CM | POA: Diagnosis not present

## 2014-05-17 DIAGNOSIS — I2510 Atherosclerotic heart disease of native coronary artery without angina pectoris: Secondary | ICD-10-CM | POA: Diagnosis present

## 2014-05-17 DIAGNOSIS — Z888 Allergy status to other drugs, medicaments and biological substances status: Secondary | ICD-10-CM

## 2014-05-17 DIAGNOSIS — Z87891 Personal history of nicotine dependence: Secondary | ICD-10-CM | POA: Diagnosis not present

## 2014-05-17 DIAGNOSIS — R05 Cough: Secondary | ICD-10-CM

## 2014-05-17 DIAGNOSIS — I471 Supraventricular tachycardia, unspecified: Secondary | ICD-10-CM | POA: Diagnosis present

## 2014-05-17 DIAGNOSIS — D649 Anemia, unspecified: Secondary | ICD-10-CM | POA: Diagnosis present

## 2014-05-17 DIAGNOSIS — N184 Chronic kidney disease, stage 4 (severe): Secondary | ICD-10-CM | POA: Diagnosis not present

## 2014-05-17 DIAGNOSIS — J101 Influenza due to other identified influenza virus with other respiratory manifestations: Secondary | ICD-10-CM | POA: Diagnosis present

## 2014-05-17 DIAGNOSIS — D472 Monoclonal gammopathy: Secondary | ICD-10-CM | POA: Diagnosis present

## 2014-05-17 DIAGNOSIS — E119 Type 2 diabetes mellitus without complications: Secondary | ICD-10-CM | POA: Diagnosis present

## 2014-05-17 DIAGNOSIS — Z96641 Presence of right artificial hip joint: Secondary | ICD-10-CM | POA: Diagnosis present

## 2014-05-17 DIAGNOSIS — Z8546 Personal history of malignant neoplasm of prostate: Secondary | ICD-10-CM

## 2014-05-17 DIAGNOSIS — K219 Gastro-esophageal reflux disease without esophagitis: Secondary | ICD-10-CM | POA: Diagnosis present

## 2014-05-17 DIAGNOSIS — I129 Hypertensive chronic kidney disease with stage 1 through stage 4 chronic kidney disease, or unspecified chronic kidney disease: Secondary | ICD-10-CM | POA: Diagnosis present

## 2014-05-17 DIAGNOSIS — R059 Cough, unspecified: Secondary | ICD-10-CM

## 2014-05-17 DIAGNOSIS — J438 Other emphysema: Secondary | ICD-10-CM

## 2014-05-17 DIAGNOSIS — J069 Acute upper respiratory infection, unspecified: Secondary | ICD-10-CM

## 2014-05-17 DIAGNOSIS — R000 Tachycardia, unspecified: Secondary | ICD-10-CM | POA: Diagnosis not present

## 2014-05-17 HISTORY — DX: Supraventricular tachycardia: I47.1

## 2014-05-17 HISTORY — DX: Type 2 diabetes mellitus without complications: E11.9

## 2014-05-17 HISTORY — DX: Chronic kidney disease, stage 3 unspecified: N18.30

## 2014-05-17 HISTORY — DX: Chronic kidney disease, stage 3 (moderate): N18.3

## 2014-05-17 HISTORY — DX: Malignant neoplasm of prostate: C61

## 2014-05-17 LAB — CBC WITH DIFFERENTIAL/PLATELET
BASOS PCT: 0 % (ref 0–1)
Basophils Absolute: 0 10*3/uL (ref 0.0–0.1)
EOS ABS: 0 10*3/uL (ref 0.0–0.7)
EOS PCT: 0 % (ref 0–5)
HCT: 28.3 % — ABNORMAL LOW (ref 39.0–52.0)
Hemoglobin: 9.5 g/dL — ABNORMAL LOW (ref 13.0–17.0)
Lymphocytes Relative: 6 % — ABNORMAL LOW (ref 12–46)
Lymphs Abs: 0.3 10*3/uL — ABNORMAL LOW (ref 0.7–4.0)
MCH: 33.7 pg (ref 26.0–34.0)
MCHC: 33.6 g/dL (ref 30.0–36.0)
MCV: 100.4 fL — ABNORMAL HIGH (ref 78.0–100.0)
Monocytes Absolute: 0.2 10*3/uL (ref 0.1–1.0)
Monocytes Relative: 3 % (ref 3–12)
Neutro Abs: 4.6 10*3/uL (ref 1.7–7.7)
Neutrophils Relative %: 91 % — ABNORMAL HIGH (ref 43–77)
Platelets: 126 10*3/uL — ABNORMAL LOW (ref 150–400)
RBC: 2.82 MIL/uL — AB (ref 4.22–5.81)
RDW: 14.8 % (ref 11.5–15.5)
WBC: 5.1 10*3/uL (ref 4.0–10.5)

## 2014-05-17 LAB — INFLUENZA PANEL BY PCR (TYPE A & B)
H1N1 flu by pcr: DETECTED — AB
Influenza A By PCR: POSITIVE — AB
Influenza B By PCR: NEGATIVE

## 2014-05-17 LAB — CBC
HCT: 29.2 % — ABNORMAL LOW (ref 39.0–52.0)
HEMOGLOBIN: 9.9 g/dL — AB (ref 13.0–17.0)
MCH: 33.8 pg (ref 26.0–34.0)
MCHC: 33.9 g/dL (ref 30.0–36.0)
MCV: 99.7 fL (ref 78.0–100.0)
PLATELETS: 126 10*3/uL — AB (ref 150–400)
RBC: 2.93 MIL/uL — ABNORMAL LOW (ref 4.22–5.81)
RDW: 15 % (ref 11.5–15.5)
WBC: 5.9 10*3/uL (ref 4.0–10.5)

## 2014-05-17 LAB — URINALYSIS, ROUTINE W REFLEX MICROSCOPIC
Bilirubin Urine: NEGATIVE
Glucose, UA: NEGATIVE mg/dL
Hgb urine dipstick: NEGATIVE
Ketones, ur: 15 mg/dL — AB
LEUKOCYTES UA: NEGATIVE
NITRITE: NEGATIVE
PH: 5.5 (ref 5.0–8.0)
PROTEIN: NEGATIVE mg/dL
Specific Gravity, Urine: 1.015 (ref 1.005–1.030)
Urobilinogen, UA: 1 mg/dL (ref 0.0–1.0)

## 2014-05-17 LAB — GLUCOSE, CAPILLARY
Glucose-Capillary: 129 mg/dL — ABNORMAL HIGH (ref 70–99)
Glucose-Capillary: 139 mg/dL — ABNORMAL HIGH (ref 70–99)

## 2014-05-17 LAB — COMPREHENSIVE METABOLIC PANEL
ALK PHOS: 89 U/L (ref 39–117)
ALT: 14 U/L (ref 0–53)
AST: 27 U/L (ref 0–37)
Albumin: 3.3 g/dL — ABNORMAL LOW (ref 3.5–5.2)
Anion gap: 9 (ref 5–15)
BILIRUBIN TOTAL: 1.3 mg/dL — AB (ref 0.3–1.2)
BUN: 39 mg/dL — AB (ref 6–23)
CALCIUM: 8.5 mg/dL (ref 8.4–10.5)
CO2: 27 mmol/L (ref 19–32)
CREATININE: 1.97 mg/dL — AB (ref 0.50–1.35)
Chloride: 104 mmol/L (ref 96–112)
GFR calc Af Amer: 33 mL/min — ABNORMAL LOW (ref >90)
GFR calc non Af Amer: 29 mL/min — ABNORMAL LOW (ref >90)
Glucose, Bld: 175 mg/dL — ABNORMAL HIGH (ref 70–99)
Potassium: 4.1 mmol/L (ref 3.5–5.1)
SODIUM: 140 mmol/L (ref 135–145)
TOTAL PROTEIN: 6.3 g/dL (ref 6.0–8.3)

## 2014-05-17 LAB — TSH: TSH: 0.918 u[IU]/mL (ref 0.350–4.500)

## 2014-05-17 LAB — CREATININE, SERUM
Creatinine, Ser: 1.97 mg/dL — ABNORMAL HIGH (ref 0.50–1.35)
GFR calc non Af Amer: 29 mL/min — ABNORMAL LOW (ref >90)
GFR, EST AFRICAN AMERICAN: 33 mL/min — AB (ref >90)

## 2014-05-17 LAB — LIPASE, BLOOD: Lipase: 23 U/L (ref 11–59)

## 2014-05-17 LAB — I-STAT CG4 LACTIC ACID, ED
LACTIC ACID, VENOUS: 1.54 mmol/L (ref 0.5–2.0)
Lactic Acid, Venous: 3.17 mmol/L (ref 0.5–2.0)

## 2014-05-17 MED ORDER — INSULIN ASPART 100 UNIT/ML ~~LOC~~ SOLN: 0.0000 [IU] | Freq: Every day | SUBCUTANEOUS | Status: DC

## 2014-05-17 MED ORDER — OSELTAMIVIR PHOSPHATE 75 MG PO CAPS
75.0000 mg | ORAL_CAPSULE | Freq: Two times a day (BID) | ORAL | Status: DC
  Filled 2014-05-17: qty 1

## 2014-05-17 MED ORDER — ACETAMINOPHEN 650 MG RE SUPP: 650.0000 mg | Freq: Four times a day (QID) | RECTAL | Status: DC | PRN

## 2014-05-17 MED ORDER — ONDANSETRON HCL 4 MG/2ML IJ SOLN
4.0000 mg | Freq: Once | INTRAMUSCULAR | Status: AC
  Administered 2014-05-17: 4 mg via INTRAVENOUS
  Filled 2014-05-17: qty 2

## 2014-05-17 MED ORDER — SODIUM CHLORIDE 0.9 % IV BOLUS (SEPSIS)
250.0000 mL | Freq: Once | INTRAVENOUS | Status: AC
  Administered 2014-05-17: 250 mL via INTRAVENOUS

## 2014-05-17 MED ORDER — SODIUM CHLORIDE 0.9 % IV SOLN
INTRAVENOUS | Status: DC
  Administered 2014-05-17 (×2): via INTRAVENOUS

## 2014-05-17 MED ORDER — OSELTAMIVIR PHOSPHATE 30 MG PO CAPS
30.0000 mg | ORAL_CAPSULE | Freq: Every day | ORAL | Status: DC
  Administered 2014-05-17 – 2014-05-18 (×2): 30 mg via ORAL
  Filled 2014-05-17 (×4): qty 1

## 2014-05-17 MED ORDER — AZITHROMYCIN 500 MG IV SOLR
500.0000 mg | INTRAVENOUS | Status: DC
  Administered 2014-05-17: 500 mg via INTRAVENOUS
  Filled 2014-05-17 (×2): qty 500

## 2014-05-17 MED ORDER — PIPERACILLIN-TAZOBACTAM 3.375 G IVPB 30 MIN
3.3750 g | Freq: Once | INTRAVENOUS | Status: AC
  Administered 2014-05-17: 3.375 g via INTRAVENOUS
  Filled 2014-05-17: qty 50

## 2014-05-17 MED ORDER — INSULIN ASPART 100 UNIT/ML ~~LOC~~ SOLN: 0.0000 [IU] | Freq: Three times a day (TID) | SUBCUTANEOUS | Status: DC

## 2014-05-17 MED ORDER — OSELTAMIVIR PHOSPHATE 30 MG PO CAPS
30.0000 mg | ORAL_CAPSULE | Freq: Two times a day (BID) | ORAL | Status: DC
  Filled 2014-05-17: qty 1

## 2014-05-17 MED ORDER — HEPARIN SODIUM (PORCINE) 5000 UNIT/ML IJ SOLN: 5000.0000 [IU] | Freq: Three times a day (TID) | INTRAMUSCULAR | Status: DC

## 2014-05-17 MED ORDER — ATENOLOL 50 MG PO TABS
50.0000 mg | ORAL_TABLET | Freq: Once | ORAL | Status: AC
  Administered 2014-05-17: 50 mg via ORAL
  Filled 2014-05-17: qty 1

## 2014-05-17 MED ORDER — ASPIRIN EC 81 MG PO TBEC
81.0000 mg | DELAYED_RELEASE_TABLET | ORAL | Status: DC
  Administered 2014-05-18: 81 mg via ORAL
  Filled 2014-05-17: qty 1

## 2014-05-17 MED ORDER — ALUM & MAG HYDROXIDE-SIMETH 200-200-20 MG/5ML PO SUSP: 30.0000 mL | Freq: Four times a day (QID) | ORAL | Status: DC | PRN

## 2014-05-17 MED ORDER — ONDANSETRON HCL 4 MG/2ML IJ SOLN
4.0000 mg | Freq: Four times a day (QID) | INTRAMUSCULAR | Status: DC | PRN
  Administered 2014-05-17: 4 mg via INTRAVENOUS
  Filled 2014-05-17: qty 2

## 2014-05-17 MED ORDER — SODIUM CHLORIDE 0.9 % IJ SOLN: 3.0000 mL | INTRAMUSCULAR | Status: DC | PRN

## 2014-05-17 MED ORDER — ASPIRIN 81 MG PO TABS: 81.0000 mg | ORAL_TABLET | ORAL | Status: DC

## 2014-05-17 MED ORDER — SODIUM CHLORIDE 0.9 % IV SOLN: 250.0000 mL | INTRAVENOUS | Status: DC | PRN

## 2014-05-17 MED ORDER — SODIUM CHLORIDE 0.9 % IV SOLN: INTRAVENOUS | Status: DC

## 2014-05-17 MED ORDER — SODIUM CHLORIDE 0.9 % IV SOLN
INTRAVENOUS | Status: DC
Start: 2014-05-17 — End: 2014-05-18

## 2014-05-17 MED ORDER — ACETAMINOPHEN 325 MG PO TABS
650.0000 mg | ORAL_TABLET | Freq: Four times a day (QID) | ORAL | Status: DC | PRN
  Administered 2014-05-17: 650 mg via ORAL
  Filled 2014-05-17: qty 2

## 2014-05-17 MED ORDER — PIPERACILLIN-TAZOBACTAM 3.375 G IVPB
3.3750 g | Freq: Three times a day (TID) | INTRAVENOUS | Status: DC
  Filled 2014-05-17 (×2): qty 50

## 2014-05-17 MED ORDER — VANCOMYCIN HCL IN DEXTROSE 1-5 GM/200ML-% IV SOLN: 1000.0000 mg | INTRAVENOUS | Status: DC

## 2014-05-17 MED ORDER — CEFTRIAXONE SODIUM IN DEXTROSE 20 MG/ML IV SOLN
1.0000 g | INTRAVENOUS | Status: DC
  Administered 2014-05-17: 1 g via INTRAVENOUS
  Filled 2014-05-17 (×3): qty 50

## 2014-05-17 MED ORDER — ATENOLOL 50 MG PO TABS
50.0000 mg | ORAL_TABLET | Freq: Two times a day (BID) | ORAL | Status: DC
  Filled 2014-05-17 (×3): qty 1

## 2014-05-17 MED ORDER — VANCOMYCIN HCL IN DEXTROSE 1-5 GM/200ML-% IV SOLN
1000.0000 mg | Freq: Once | INTRAVENOUS | Status: AC
  Administered 2014-05-17: 1000 mg via INTRAVENOUS
  Filled 2014-05-17: qty 200

## 2014-05-17 MED ORDER — ONDANSETRON HCL 4 MG PO TABS: 4.0000 mg | ORAL_TABLET | Freq: Four times a day (QID) | ORAL | Status: DC | PRN

## 2014-05-17 MED ORDER — SODIUM CHLORIDE 0.9 % IJ SOLN
3.0000 mL | Freq: Two times a day (BID) | INTRAMUSCULAR | Status: DC
  Administered 2014-05-18 – 2014-05-19 (×2): 3 mL via INTRAVENOUS

## 2014-05-17 MED ORDER — SODIUM CHLORIDE 0.9 % IJ SOLN: 3.0000 mL | Freq: Two times a day (BID) | INTRAMUSCULAR | Status: DC

## 2014-05-17 MED ORDER — ATENOLOL 25 MG PO TABS
25.0000 mg | ORAL_TABLET | Freq: Once | ORAL | Status: DC
  Filled 2014-05-17: qty 1

## 2014-05-17 NOTE — Progress Notes (Signed)
ANTIBIOTIC CONSULT NOTE - INITIAL  Pharmacy Consult for vancomycin Indication: rule out sepsis  Allergies  Allergen Reactions  . Meperidine Hcl     REACTION: intol---causes nausea    Patient Measurements:    Body Weight: 64.4 kg  Vital Signs: Temp: 100.9 F (38.3 C) (03/09 1112) Temp Source: Oral (03/09 1112) BP: 119/57 mmHg (03/09 1240) Pulse Rate: 81 (03/09 1240) Intake/Output from previous day:   Intake/Output from this shift:    Labs: No results for input(s): WBC, HGB, PLT, LABCREA, CREATININE in the last 72 hours. CrCl cannot be calculated (Unknown ideal weight.). No results for input(s): VANCOTROUGH, VANCOPEAK, VANCORANDOM, GENTTROUGH, GENTPEAK, GENTRANDOM, TOBRATROUGH, TOBRAPEAK, TOBRARND, AMIKACINPEAK, AMIKACINTROU, AMIKACIN in the last 72 hours.   Microbiology: No results found for this or any previous visit (from the past 720 hour(s)).  Medical History: Past Medical History  Diagnosis Date  . Diabetes mellitus   . Spinal stenosis   . Peripheral neuropathy   . Degenerative joint disease   . pulmonary hypertension   . Hypertension   . Pulmonary hypertension   . Monoclonal gammopathy   . Coronary artery disease   . Anemia   . Cancer     Prostate  . GERD (gastroesophageal reflux disease)   . Hiatal hernia   . DYSPNEA ON EXERTION 07/19/2008  . MGUS (monoclonal gammopathy of unknown significance) 01/31/2014    IgA kappa dx 07/22/10     Medications:  See medication history Assessment: 81 yo man to start broad spectrum antibiotics for r/o sepsis.  He has orders for vanc 1gm and zosyn 3.375 IV in the ED.  CrCl ~30 ml/min  Goal of Therapy:  Vancomycin trough level 15-20 mcg/ml  Plan:  Zosyn 3.375 gm IV q8 hours Vancomycin 1gm IV q24 hours F/u renal function, cultures and clinical course Vanc trough when appropriate.  Thanks for allowing pharmacy to be a part of this patient's care.  Excell Seltzer, PharmD Clinical Pharmacist, [phone] 05/17/2014,12:58  PM

## 2014-05-17 NOTE — ED Notes (Signed)
MD requests that both blood culture sets be collected prior to antibiotic administration.

## 2014-05-17 NOTE — ED Provider Notes (Signed)
CSN: [id number]     Arrival date & time 05/17/14  1059 History   First MD Initiated Contact with Patient 05/17/14 1104     Chief Complaint  Patient presents with  . Tachycardia     (Consider location/radiation/quality/duration/timing/severity/associated sxs/prior Treatment) The history is provided by the patient and a relative.   patient brought in by EMS. Patient with supraventricular tachycardia EMS confirmed gave adenosine 6 mg patient was also hypotensive. Responded to the adenosine. Then shortly thereafter went back into SVT received a second dose of adenosine 6 mg. Was hypotensive during the second episode. Then patient improved with back to normal sinus rhythm blood pressure was 126/68. Heart rate was 96. Blood sugar was 274.  Patients had an upper respiratory infection the past few days. Patient started to feel bad during the night summed waves of nausea also feeling very fatigued and feeling like he was sick.  Past Medical History  Diagnosis Date  . Diabetes mellitus   . Spinal stenosis   . Peripheral neuropathy   . Degenerative joint disease   . pulmonary hypertension   . Hypertension   . Pulmonary hypertension   . Monoclonal gammopathy   . Coronary artery disease   . Anemia   . Cancer     Prostate  . GERD (gastroesophageal reflux disease)   . Hiatal hernia   . DYSPNEA ON EXERTION 07/19/2008  . MGUS (monoclonal gammopathy of unknown significance) 01/31/2014    IgA kappa dx 07/22/10    Past Surgical History  Procedure Laterality Date  . Cholecystectomy    . Colon surgery    . Laminectomy      x 4 last sx 1960's  . Coronary angioplasty    . Joint replacement      Right Hip replacement 11/18/10   No family history on file. History  Substance Use Topics  . Smoking status: Former Smoker    Types: Cigarettes    Quit date: 02/09/1974  . Smokeless tobacco: Not on file  . Alcohol Use: No    Review of Systems  Constitutional: Positive for fever and fatigue.  HENT:  Positive for congestion.   Eyes: Negative for redness.  Cardiovascular: Positive for palpitations. Negative for chest pain.  Gastrointestinal: Positive for nausea. Negative for abdominal pain.  Genitourinary: Negative for dysuria.  Musculoskeletal: Negative for back pain.  Skin: Negative for rash.  Neurological: Negative for headaches.  Hematological: Does not bruise/bleed easily.  Psychiatric/Behavioral: Negative for confusion.      Allergies  Meperidine hcl  Home Medications   Prior to Admission medications   Medication Sig Start Date End Date Taking? Authorizing Provider  aspirin 81 MG tablet Take 81 mg by mouth every other day.    Yes Historical Provider, MD  atenolol (TENORMIN) 50 MG tablet Take 50 mg by mouth 2 (two) times daily.    Yes Historical Provider, MD  cholecalciferol (VITAMIN D) 1000 UNITS tablet Take 1,000 Units by mouth daily.   Yes Historical Provider, MD  hydrochlorothiazide (HYDRODIURIL) 25 MG tablet Take 12.5 mg by mouth daily.     Yes Historical Provider, MD  losartan (COZAAR) 100 MG tablet Take 100 mg by mouth daily.     Yes Historical Provider, MD  metFORMIN (GLUCOPHAGE-XR) 750 MG 24 hr tablet Take 750 mg by mouth daily with supper.     Yes Historical Provider, MD  omeprazole (PRILOSEC) 40 MG capsule Take 40 mg by mouth daily.     Yes Historical Provider, MD  pyridOXINE (VITAMIN  B-6) 100 MG tablet Take 100 mg by mouth daily.     Yes Historical Provider, MD  Tamsulosin HCl (FLOMAX) 0.4 MG CAPS Take 0.4 mg by mouth daily. 30 min after the same meal, daily    Yes Historical Provider, MD  vitamin B-12 (CYANOCOBALAMIN) 500 MCG tablet Take 500 mcg by mouth daily. lozenge    Yes Historical Provider, MD  vitamin C (ASCORBIC ACID) 500 MG tablet Take 500 mg by mouth daily.     Yes Historical Provider, MD   BP 115/51 mmHg  Pulse 70  Temp(Src) 100.9 F (38.3 C) (Oral)  Resp 28  SpO2 92% Physical Exam  Constitutional: He is oriented to person, place, and time. He  appears well-developed and well-nourished. No distress.  HENT:  Head: Normocephalic and atraumatic.  Mouth/Throat: Oropharynx is clear and moist.  Eyes: Conjunctivae and EOM are normal. Pupils are equal, round, and reactive to light.  Neck: Normal range of motion. Neck supple.  Cardiovascular: Normal rate and regular rhythm.   Pulmonary/Chest: Effort normal and breath sounds normal.  Abdominal: Soft. Bowel sounds are normal.  Musculoskeletal: Normal range of motion.  Neurological: He is alert and oriented to person, place, and time. No cranial nerve deficit. He exhibits normal muscle tone. Coordination normal.  Skin: Skin is warm.  Nursing note and vitals reviewed.   ED Course  Procedures (including critical care time) Labs Review Labs Reviewed  COMPREHENSIVE METABOLIC PANEL - Abnormal; Notable for the following:    Glucose, Bld 175 (*)    BUN 39 (*)    Creatinine, Ser 1.97 (*)    Albumin 3.3 (*)    Total Bilirubin 1.3 (*)    GFR calc non Af Amer 29 (*)    GFR calc Af Amer 33 (*)    All other components within normal limits  URINALYSIS, ROUTINE W REFLEX MICROSCOPIC - Abnormal; Notable for the following:    Ketones, ur 15 (*)    All other components within normal limits  CBC WITH DIFFERENTIAL/PLATELET - Abnormal; Notable for the following:    RBC 2.82 (*)    Hemoglobin 9.5 (*)    HCT 28.3 (*)    MCV 100.4 (*)    Neutrophils Relative % 91 (*)    Lymphocytes Relative 6 (*)    Lymphs Abs 0.3 (*)    All other components within normal limits  I-STAT CG4 LACTIC ACID, ED - Abnormal; Notable for the following:    Lactic Acid, Venous 3.17 (*)    All other components within normal limits  CULTURE, BLOOD (ROUTINE X 2)  CULTURE, BLOOD (ROUTINE X 2)  URINE CULTURE  LIPASE, BLOOD  CBC WITH DIFFERENTIAL/PLATELET  I-STAT CG4 LACTIC ACID, ED    Imaging Review Dg Chest Port 1 View  05/17/2014   CLINICAL DATA:  Cough and fever.  History of COPD.  EXAM: PORTABLE CHEST - 1 VIEW   COMPARISON:  PA and lateral chest 11/25/2010.  FINDINGS: The lungs are clear. Heart size is normal. No pneumothorax or pleural effusion. Lung volumes are somewhat low.  IMPRESSION: No acute disease.   Electronically Signed   By: Inge Rise M.D.   On: 05/17/2014 12:33     EKG Interpretation   Date/Time:  Wednesday May 17 2014 11:17:15 EST Ventricular Rate:  84 PR Interval:  194 QRS Duration: 147 QT Interval:  376 QTC Calculation: 444 R Axis:   -76 Text Interpretation:  Sinus rhythm Atrial premature complexes RBBB and  LAFB Otherwise no significant change  since 2014  Confirmed by Dori Devino   MD, [place] [phone]) on 05/17/2014 11:24:17 AM      MDM   Final diagnoses:  Cough  URI (upper respiratory infection)  Fever, unspecified fever cause  SVT (supraventricular tachycardia)    Patient not feeling well starting overnight. His had an upper respiratory infection for the past few days. Patient's had trouble with up sputum trigger tachycardia before. Did take his beta blocker last night. But did not take it this morning. This morning had 2 runs of the super ventricular tachycardia with hypotension. Was given adenosine 6 mg 2 responded to both doses but had the recurrence. After the second dose no recurrence. Noted to have fever here. Lactic acid levels elevated.. Never had any additional hypotension. Workup without any significant abnormalities. Concerns for clinical pre-septic picture. Patient had blood cultures and urine is still pending antibiotic started. Clinically could be a developing pneumonia that's not evident on chest x-ray currently. As stated patient had low-grade fever here. Patient will require admission.  Patient given his beta blocker here and now heart rate is been well controlled. No hypotension. Patient definitely has a fever but may not be related to the lactic acid and this may not be a pre-septic picture. However it was best to be safe than sorry so started on  antibiotics and cultures done. Is possible that due to the 2 periods of the hypotension reported by EMS during the SVT runs that that is the cause of lactic acid. Patient without leukocytosis. Urinalysis is negative. Discussed with hospitalist they will admit. We will also consult cardiology.     Fredia Sorrow, MD 05/17/14 [phone]

## 2014-05-17 NOTE — Progress Notes (Addendum)
Report received from the ED at 1500 and pt arrived to the unit at 1545; pt oriented to the unit and room; vitals taken; telemetry applied and verified; IV intact and infusing to right AC and left AC saline locked; pt placed on droplet precaution per protocol as Flu pcr sent for testing; pt had a fever of 100.9, and chills; pt request for tylenol and his prn Tylenol administered to him and pt states feeling better; Reported off to incoming RN; pt in bed with call light within reach. Will continue to monitor quietly. Francis Gaines Cheila Wickstrom RN.

## 2014-05-17 NOTE — H&P (Signed)
[facility] History and Physical  Paul Burnett VVO:[id number] DOB: 25-Dec-1924 DOA: 05/17/2014  Referring physician:  PCP: Horton Finer, MD   Chief Complaint: Generalized weakness  HPI: Paul Burnett is a 81 y.o. male patient is a pleasant 81 year old gentleman with a past medical history of SVT, brought to the emergent apartment by EMS from his home. He reported yesterday evening feeling "sick" having cough, subjective fevers, shortness of breath, malaise, generalized weakness. He woke up this morning continued to feel ill, spoke with his family members who called EMS. Upon arrival he was found to be hypotensive with blood pressures any 5/44 having a heart rate of 163 in SVT. Patient was given 6 mg of adenosine with cells with conversion to normal sinus rhythm and improvement to blood pressures. Patient will When back into SVT with hypotension having blood pressure of 81/58. He was given another dose of 6 mg of adenosine converting back to normal sinus rhythm. Lab work in the emergency department revealed a white count of 5100 with chest x-ray not showing acute Eulas Post pulmonary disease.                                                            Review of Systems:  Constitutional:  No weight loss, night sweats, positive for Fevers, chills, fatigue, malaise.  HEENT:  No headaches, Difficulty swallowing,Tooth/dental problems,Sore throat,  No sneezing, itching, ear ache, nasal congestion, post nasal drip,  Cardio-vascular:  No chest pain, Orthopnea, PND, swelling in lower extremities, anasarca, dizziness, palpitations  GI:  No heartburn, indigestion, abdominal pain, nausea, vomiting, diarrhea, change in bowel habits, loss of appetite  Resp:  No shortness of breath with exertion or at rest. No excess mucus, no productive cough, No non-productive cough, No coughing up of blood.No change in color of mucus.No wheezing.No chest wall deformity  Skin:  no rash or lesions.  GU:  no  dysuria, change in color of urine, no urgency or frequency. No flank pain.  Musculoskeletal:  No joint pain or swelling. No decreased range of motion. No back pain.  Psych:  No change in mood or affect. No depression or anxiety. No memory loss.   Past Medical History  Diagnosis Date  . Diabetes mellitus   . Spinal stenosis   . Peripheral neuropathy   . Degenerative joint disease   . pulmonary hypertension   . Hypertension   . Pulmonary hypertension   . Monoclonal gammopathy   . Coronary artery disease   . Anemia   . Cancer     Prostate  . GERD (gastroesophageal reflux disease)   . Hiatal hernia   . DYSPNEA ON EXERTION 07/19/2008  . MGUS (monoclonal gammopathy of unknown significance) 01/31/2014    IgA kappa dx 07/22/10   . CKD (chronic kidney disease), stage III 05/17/2014   Past Surgical History  Procedure Laterality Date  . Cholecystectomy    . Colon surgery    . Laminectomy      x 4 last sx 1960's  . Coronary angioplasty    . Joint replacement      Right Hip replacement 11/18/10   Social History:  reports that he quit smoking about 40 years ago. His smoking use included Cigarettes. He does not have any smokeless tobacco history on file. He reports that  he does not drink alcohol or use illicit drugs.  Allergies  Allergen Reactions  . Meperidine Hcl     REACTION: intol---causes nausea    No family history on file.   Prior to Admission medications   Medication Sig Start Date End Date Taking? Authorizing Provider  aspirin 81 MG tablet Take 81 mg by mouth every other day.    Yes Historical Provider, MD  atenolol (TENORMIN) 50 MG tablet Take 50 mg by mouth 2 (two) times daily.    Yes Historical Provider, MD  cholecalciferol (VITAMIN D) 1000 UNITS tablet Take 1,000 Units by mouth daily.   Yes Historical Provider, MD  hydrochlorothiazide (HYDRODIURIL) 25 MG tablet Take 12.5 mg by mouth daily.     Yes Historical Provider, MD  losartan (COZAAR) 100 MG tablet Take 100 mg by  mouth daily.     Yes Historical Provider, MD  metFORMIN (GLUCOPHAGE-XR) 750 MG 24 hr tablet Take 750 mg by mouth daily with supper.     Yes Historical Provider, MD  omeprazole (PRILOSEC) 40 MG capsule Take 40 mg by mouth daily.     Yes Historical Provider, MD  pyridOXINE (VITAMIN B-6) 100 MG tablet Take 100 mg by mouth daily.     Yes Historical Provider, MD  Tamsulosin HCl (FLOMAX) 0.4 MG CAPS Take 0.4 mg by mouth daily. 30 min after the same meal, daily    Yes Historical Provider, MD  vitamin B-12 (CYANOCOBALAMIN) 500 MCG tablet Take 500 mcg by mouth daily. lozenge    Yes Historical Provider, MD  vitamin C (ASCORBIC ACID) 500 MG tablet Take 500 mg by mouth daily.     Yes Historical Provider, MD   Physical Exam: Filed Vitals:   05/17/14 1300 05/17/14 1415 05/17/14 1416 05/17/14 1500  BP: 122/54 115/51 115/51 101/45  Pulse: 83 70 70 65  Temp:      TempSrc:      Resp: 24 26 28 26   SpO2: 96% 91% 92% 93%    Wt Readings from Last 3 Encounters:  08/09/13 64.411 kg (142 lb)  02/01/13 65.772 kg (145 lb)  12/16/12 67.132 kg (148 lb)    General:  Appears calm and comfortable, in no acute distress. Eyes: PERRL, normal lids, irises & conjunctiva ENT: grossly normal hearing, lips & tongue, dry oral mucosa Neck: no LAD, masses or thyromegaly Cardiovascular: RRR, no m/r/g. No LE edema. Telemetry: SR, no arrhythmias  Respiratory: CTA bilaterally, no w/r/r. Normal respiratory effort. Has bibasilar crackles on lung exam Abdomen: soft, ntnd Skin: Patient with history of ichthyosis, having desquamation of skin Musculoskeletal: grossly normal tone BUE/BLE Psychiatric: grossly normal mood and affect, speech fluent and appropriate Neurologic: grossly non-focal.          Labs on Admission:  Basic Metabolic Panel:  Recent Labs Lab 05/17/14 1224  NA 140  K 4.1  CL 104  CO2 27  GLUCOSE 175*  BUN 39*  CREATININE 1.97*  CALCIUM 8.5   Liver Function Tests:  Recent Labs Lab 05/17/14 1224    AST 27  ALT 14  ALKPHOS 89  BILITOT 1.3*  PROT 6.3  ALBUMIN 3.3*    Recent Labs Lab 05/17/14 1224  LIPASE 23   No results for input(s): AMMONIA in the last 168 hours. CBC:  Recent Labs Lab 05/17/14 1224 16/10/96 0454  WBC DUPLICATE REQUEST 5.1  NEUTROABS  --  4.6  HGB  --  9.5*  HCT  --  28.3*  MCV  --  100.4*  PLT  --  126*   Cardiac Enzymes: No results for input(s): CKTOTAL, CKMB, CKMBINDEX, TROPONINI in the last 168 hours.  BNP (last 3 results) No results for input(s): BNP in the last 8760 hours.  ProBNP (last 3 results) No results for input(s): PROBNP in the last 8760 hours.  CBG: No results for input(s): GLUCAP in the last 168 hours.  Radiological Exams on Admission: Dg Chest Port 1 View  05/17/2014   CLINICAL DATA:  Cough and fever.  History of COPD.  EXAM: PORTABLE CHEST - 1 VIEW  COMPARISON:  PA and lateral chest 11/25/2010.  FINDINGS: The lungs are clear. Heart size is normal. No pneumothorax or pleural effusion. Lung volumes are somewhat low.  IMPRESSION: No acute disease.   Electronically Signed   By: Inge Rise M.D.   On: 05/17/2014 12:33    EKG: Independently reviewed.   Assessment/Plan Principal Problem:   SVT (supraventricular tachycardia) Active Problems:   Essential hypertension   Coronary atherosclerosis   COPD (chronic obstructive pulmonary disease)   CKD (chronic kidney disease), stage III   1. Supraventricular tachycardia. Patient found to be in SVT and hypotensive, was given adenosine 6 mg IV. Currently in normal sinus rhythm, hemodynamically stable. Emergency room provider consulted cardiology. For now will continue atenolol 50 mg by mouth twice a day. Will admit patient to telemetry, close monitoring overnight. Await further recommendations from cardiology. 2. Possible community acquire pneumonia. Patient complaining of generalized weakness, malaise, subjective fevers, and shortness of breath. Initial chest x-ray performed in the  emergency department did not show acute cardiopulmonary process. Will cover with empiric IV antimicrobial therapy for possible developing committee acquired pneumonia with azithromycin and ceftriaxone. Other possibilities include viral infection. Blood cultures were obtained in the emergency room. Will check a flu swab. Plan to repeat chest x-ray in a.m. 3. Hypertension. Patient hypotensive during episode of SVT. We'll continue atenolol 50 mg by mouth twice a day. Blood pressures remained soft, will hold Cozaar and hydrochlorothiazide for now. 4. Type 2 diabetes mellitus. Will hold metformin, provide sliding scale coverage every before meals and daily at bedtime 5. Stage III chronic disease. Patient having baseline creatinine of 1.9. Initial lab work showing creatinine 1.97 with BUN of 39. 6. DVT prophylaxis. Subcutaneous heparin   Code Status: CODE STATUS discussed with family, patient is a full code Family Communication: Spoke with patient's son Disposition Plan: Will admit patient to the inpatient service, anticipate patient will require greater than 2 nights hospitalization  Time spent: 65 min  Paul Burnett [facility] Pager [phone]

## 2014-05-17 NOTE — Consult Note (Signed)
CARDIOLOGY CONSULTATION                                                                                 Reason for Consult:  SVT  Requesting Physician: Dr. Coralyn Pear  Primary Cardiologist:  Dr. Pernell Dupre    PATIENT PROFILE: Paul Burnett is a 81 y.o. male who developed recurrent episode of SVT today.  Cardiology consultation was requested.   HPI:  Paul Burnett is a 80 y.o. male who has a history of type 2 diabetes mellitus, hypertension , renal insufficiency with stage III chronic disease and history of SVT.  He tells me over 25 years ago, he had undergone cardiac catheterization by Dr. Tamala Julian.  He was not told of having significant blockage. He has been on atenolol 50 mg twice a day for SVT with fairly good control.  Yesterday, he did not feel as well and had a mild cough and subjective fever and weakness.  This morning after awakening he felt his heart begin to race.  He was unable to get up and take his atenolol.  EMS was called and he was given adenosine by EMS in the ambulance with initial conversion to sinus rhythm. He develop recurrent SVT with a drop of blood pressure 81/58, received another dose of 6 mg of adenosine which converted him back to sinus rhythm.  He denies associated chest tightness.  He admits to sensation of low-grade fever.  He is now admitted to telemetry with monitoring.  He is felt possibly to have a community-acquired pneumonia, received a dose of IV vancomycin.  Blood cultures were obtained.  The emergency room.  A flu swab was done.  Past Medical History  Diagnosis Date  . Spinal stenosis   . Peripheral neuropathy   . Degenerative joint disease   . pulmonary hypertension   . Hypertension   . Pulmonary hypertension   . Monoclonal gammopathy   . Coronary artery disease   . Anemia   . GERD (gastroesophageal reflux disease)   . Hiatal hernia   . DYSPNEA ON EXERTION 07/19/2008  . MGUS (monoclonal gammopathy of unknown significance) 01/31/2014    IgA kappa dx 07/22/10   . CKD (chronic kidney disease), stage III 05/17/2014  . SVT (supraventricular tachycardia) hospitalized 05/17/2014  . Prostate cancer   . Type II diabetes mellitus     Past Surgical History  Procedure Laterality Date  . Cholecystectomy    . Colon surgery    . Laminectomy  x 4 last sx 1960's  . Coronary angioplasty    . Total hip arthroplasty Right 11/18/2010  . Joint replacement      Allergies  Allergen Reactions  . Meperidine Hcl     REACTION: intol---causes nausea   Prior to Admission medications   Medication Sig Start Date End Date Taking? Authorizing Provider  aspirin 81 MG tablet Take 81 mg by mouth every other day.    Yes Historical Provider, MD  atenolol (TENORMIN) 50 MG tablet Take 50 mg by mouth 2 (two) times daily.    Yes Historical Provider, MD  cholecalciferol (VITAMIN D) 1000 UNITS tablet Take 1,000 Units by mouth daily.   Yes Historical Provider, MD  hydrochlorothiazide (HYDRODIURIL) 25 MG tablet Take 12.5 mg by mouth daily.    Yes Historical Provider, MD  losartan (COZAAR) 100 MG tablet Take 100 mg by mouth daily.    Yes Historical Provider, MD  metFORMIN (GLUCOPHAGE-XR) 750 MG 24 hr tablet Take 750 mg by mouth daily with supper.    Yes Historical Provider, MD  omeprazole (PRILOSEC) 40 MG capsule Take 40 mg by mouth daily.     Yes Historical Provider, MD  pyridOXINE (VITAMIN B-6) 100 MG tablet Take 100 mg by mouth daily.    Yes Historical Provider, MD  Tamsulosin HCl (FLOMAX) 0.4 MG CAPS Take 0.4 mg by mouth daily. 30 min after the same meal, daily    Yes Historical Provider, MD  vitamin B-12 (CYANOCOBALAMIN) 500 MCG tablet Take 500 mcg by mouth daily. lozenge    Yes Historical Provider, MD  vitamin C (ASCORBIC ACID) 500 MG tablet Take 500 mg by mouth daily.    Yes Historical Provider, MD        Current Facility-Administered Medications  Medication Dose Route Frequency Provider Last Rate Last Dose  . 0.9 %  sodium chloride infusion   Intravenous STAT Fredia Sorrow, MD      . 0.9 %  sodium chloride infusion  250 mL Intravenous PRN Kelvin Cellar, MD      . acetaminophen (TYLENOL) tablet 650 mg  650 mg Oral Q6H PRN Kelvin Cellar, MD       Or  . acetaminophen (TYLENOL) suppository 650 mg  650 mg Rectal Q6H PRN Kelvin Cellar, MD      . alum & mag hydroxide-simeth (MAALOX/MYLANTA) 200-200-20 MG/5ML suspension 30 mL  30 mL Oral Q6H PRN Kelvin Cellar, MD      . Derrill Memo ON 05/18/2014] aspirin EC tablet 81 mg  81 mg Oral QODAY Kelvin Cellar, MD      . atenolol (TENORMIN) tablet 50 mg  50 mg Oral BID Kelvin Cellar, MD      . azithromycin (  ZITHROMAX) 500 mg in dextrose 5 % 250 mL IVPB  500 mg Intravenous Q24H Kelvin Cellar, MD      . cefTRIAXone (ROCEPHIN) 1 g in dextrose 5 % 50 mL IVPB - Premix  1 g Intravenous Q24H Kelvin Cellar, MD      . heparin injection 5,000 Units  5,000 Units Subcutaneous 3 times per day Kelvin Cellar, MD      . insulin aspart (novoLOG) injection 0-15 Units  0-15 Units Subcutaneous TID WC Kelvin Cellar, MD      . insulin aspart (novoLOG) injection 0-5 Units  0-5 Units Subcutaneous QHS Kelvin Cellar, MD      . ondansetron (ZOFRAN) tablet 4 mg  4 mg Oral Q6H PRN Kelvin Cellar, MD       Or  . ondansetron (ZOFRAN) injection 4 mg  4 mg Intravenous Q6H PRN  Kelvin Cellar, MD      . sodium chloride 0.9 % injection 3 mL  3 mL Intravenous Q12H Kelvin Cellar, MD      . sodium chloride 0.9 % injection 3 mL  3 mL Intravenous Q12H Kelvin Cellar, MD      . sodium chloride 0.9 % injection 3 mL  3 mL Intravenous PRN Kelvin Cellar, MD       Social History:  reports that he quit smoking about 40 years ago. His smoking use included Cigarettes. He does not have any smokeless tobacco history on file. He reports that he does not drink alcohol or use illicit drugs.  Family History: Father died at 66 with MI, mother died at 78. 3 children, 2 live in [place], [place] and son ,Dr. ELIGIO ANGERT is an orthopedist in [place]: Positive for fevers, chills, weakness HEENT: Negative; No changes in vision or hearing, sinus congestion, difficulty swallowing Pulmonary: Negative; No cough, wheezing, shortness of breath, hemoptysis Cardiovascular:  See HPI; No chest pain, presyncope, syncope, palpitations, edema GI: Negative; No nausea, vomiting, diarrhea, or abdominal pain GU: Negative; No dysuria, hematuria, or difficulty voiding Musculoskeletal: Negative; no myalgias, joint pain, or weakness Hematologic/Oncologic: Negative; no easy bruising, bleeding Endocrine: Negative; no heat/cold intolerance; no diabetes Neuro: Negative; no changes in balance, headaches Skin: Positive for mycotic toenails; No rashes or skin lesions Psychiatric: Negative; No behavioral problems, depression Sleep: Negative; No daytime sleepiness, hypersomnolence, bruxism, restless legs, hypnogagnic hallucinations Other comprehensive 14 point system review is negative   Physical Exam BP 117/67 mmHg  Pulse 83  Temp(Src) 100.9 F (38.3 C) (Oral)  Resp 20  Ht $R'5\' 7"'kM$  (1.702 m)  Wt 138 lb 4.8 oz (62.732 kg)  BMI 21.66 kg/m2  SpO2 93% General: Alert, oriented, no distress.  Skin:  Dry with seborrheic keratosis HEENT: Normocephalic, atraumatic. Pupils equal round and reactive to  light; sclera anicteric; extraocular muscles intact;  Nose without nasal septal hypertrophy Mouth/Parynx benign; Mallinpatti scale 2 Neck: No JVD, no carotid bruits; normal carotid upstroke Lungs: clear to ausculatation and percussion; no wheezing or rales Chest wall: without tenderness to palpitation Heart: PMI not displaced, RRR, s1 s2 normal, 1/6 systolic murmur, no diastolic murmur, no rubs, gallops, thrills, or heaves Abdomen: soft, nontender; no hepatosplenomehaly, BS+; abdominal aorta nontender and not dilated by palpation. Back: no CVA tenderness Pulses 2+ Musculoskeletal: full range of motion, normal strength, no joint deformities Extremities: no clubbing cyanosis or edema, Homan's sign negative  Neurologic: grossly nonfocal; Cranial nerves grossly wnl Psychologic: Normal mood and affect   ECG (independently read by me): NSR with RBBB and LAHB, PAC  LABS:  BMP Latest Ref Rng 05/17/2014 02/06/2014 02/06/2014  Glucose 70 - 99 mg/dL 175(H) 125 -  BUN 6 - 23 mg/dL 39(H) 38.6(H) -  Creatinine 0.50 - 1.35 mg/dL 1.97(H) 1.9(H) 1.81(H)  Sodium 135 - 145 mmol/L 140 144 -  Potassium 3.5 - 5.1 mmol/L 4.1 4.4 -  Chloride 96 - 112 mmol/L 104 - -  CO2 19 - 32 mmol/L 27 26 -  Calcium 8.4 - 10.5 mg/dL 8.5 9.5 -   GFR: 29    Hepatic Function Latest Ref Rng 05/17/2014 11/22/2010 11/13/2010  Total Protein 6.0 - 8.3 g/dL 6.3 5.8(L) 6.7  Albumin 3.5 - 5.2 g/dL 3.3(L) 2.4(L) 3.8  AST 0 - 37 U/L $Remo'27 17 20  'HxEaU$ ALT 0 - 53 U/L $Remo'14 6 14  'EIqOY$ Alk Phosphatase 39 - 117 U/L 89 99 159(H)  Total Bilirubin 0.3 - 1.2 mg/dL 1.3(H) 0.7 0.8     CBC Latest Ref Rng 05/17/2014 05/17/2014 11/27/2010  WBC 4.0 - 19.5 K/uL 5.1 DUPLICATE REQUEST 7.0  Hemoglobin 13.0 - 17.0 g/dL 9.5(L) - 9.7(L)  Hematocrit 39.0 - 52.0 % 28.3(L) - 28.8(L)  Platelets 150 - 400 K/uL 126(L) - 260   MCV 100.4  BNP No results found for: BNP  ProBNP No results found for: PROBNP   Lipid Panel  No results found for: CHOL, TRIG, HDL, CHOLHDL,  VLDL, LDLCALC, LDLDIRECT    RADIOLOGY: Dg Chest Port 1 View  05/17/2014   CLINICAL DATA:  Cough and fever.  History of COPD.  EXAM: PORTABLE CHEST - 1 VIEW  COMPARISON:  PA and lateral chest 11/25/2010.  FINDINGS: The lungs are clear. Heart size is normal. No pneumothorax or pleural effusion. Lung volumes are somewhat low.  IMPRESSION: No acute disease.   Electronically Signed   By: Inge Rise M.D.   On: 05/17/2014 12:33     ASSESSMENT AND PLAN:  1. SVT: patient has been on atenolol 50 mg bid for many years and has been stable.  I suspect his current episode has been exacerbated by his fever/infectious process.  He is now in sinus rhythm and I recommend continuing atenolol 50 mg twice a day presently.  Depending upon his heart rate, the dose may need to be slightly increased but will not do this presently.   2. HTN: He previously has been on atenolol 50 g twice a day, losartan 100 mg daily in addition to hydrochlorothiazide  25 mg.  With recent low blood pressure agree with holding his losartan and diuretic regimen.  3.  Renal insufficiency, creatinine today is 1.97.  This gives him a GFR of 29, which places him at the start of stage IV renal insufficiency.  Will presently hold ARB and diuretic regimen.  I would not reinstitute diuretic therapy and depending upon renal function dose of ARB may need to be reduced or discontinued.  4.  Possible community-acquired pneumonia.  Cultures obtained.  He has received a dose of vancomycin.  5.  Anemia/Thrombocytopenia with history of monoclonal gammopathy.  6.  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  This is old.   I have ordered an echo Doppler study to assess LV systolic and diastolic function and cardiac murmur.  Will check magnesium and thyroid function studies with SVT.  Doubt ischemia mediated SVT.  Continue telemetry.    Troy Sine, MD, [facility] 05/17/2014 4:51 PM

## 2014-05-17 NOTE — ED Notes (Signed)
Phlebotomy made aware of first blood culture collection and asked to draw second set.

## 2014-05-17 NOTE — ED Notes (Signed)
Attempted to call report to floor. Unable to take report at this time.

## 2014-05-17 NOTE — ED Notes (Signed)
Pt presents from home via [facility] with c/o SVT. Pt with hx of the same. Pt woke this morning feeling like he was in SVT and contacted EMS. Upon EMS arrival pt had BP 75/44 and HR 163.  Pt given 6mg  Adenosine at 1021 with conversion to NSR at 93bpm and BP 105/62.  Pt then converted back to SVT with hypotension of 81/58 and HR of 156bpm at 1046, was given 6mg  Adenosine at 1047 and converted into NSR at 1048 with BP improvement to 126/68 and HR 96.  CBG 274. Pt is A&Ox4 and in NSR.

## 2014-05-17 NOTE — Progress Notes (Signed)
Pt refused sq heparin as VTE prophylaxis. Discontinued and started SCDs.

## 2014-05-18 ENCOUNTER — Inpatient Hospital Stay (HOSPITAL_COMMUNITY): Payer: Medicare Other

## 2014-05-18 DIAGNOSIS — I2510 Atherosclerotic heart disease of native coronary artery without angina pectoris: Secondary | ICD-10-CM

## 2014-05-18 LAB — BASIC METABOLIC PANEL
ANION GAP: 9 (ref 5–15)
BUN: 33 mg/dL — ABNORMAL HIGH (ref 6–23)
CO2: 24 mmol/L (ref 19–32)
Calcium: 8.1 mg/dL — ABNORMAL LOW (ref 8.4–10.5)
Chloride: 105 mmol/L (ref 96–112)
Creatinine, Ser: 1.84 mg/dL — ABNORMAL HIGH (ref 0.50–1.35)
GFR calc Af Amer: 36 mL/min — ABNORMAL LOW (ref >90)
GFR calc non Af Amer: 31 mL/min — ABNORMAL LOW (ref >90)
Glucose, Bld: 126 mg/dL — ABNORMAL HIGH (ref 70–99)
Potassium: 4 mmol/L (ref 3.5–5.1)
Sodium: 138 mmol/L (ref 135–145)

## 2014-05-18 LAB — CBC
HCT: 27.9 % — ABNORMAL LOW (ref 39.0–52.0)
HEMOGLOBIN: 9.5 g/dL — AB (ref 13.0–17.0)
MCH: 33.6 pg (ref 26.0–34.0)
MCHC: 34.1 g/dL (ref 30.0–36.0)
MCV: 98.6 fL (ref 78.0–100.0)
Platelets: 111 10*3/uL — ABNORMAL LOW (ref 150–400)
RBC: 2.83 MIL/uL — ABNORMAL LOW (ref 4.22–5.81)
RDW: 15 % (ref 11.5–15.5)
WBC: 4.3 10*3/uL (ref 4.0–10.5)

## 2014-05-18 LAB — URINE CULTURE

## 2014-05-18 LAB — MAGNESIUM: MAGNESIUM: 1.7 mg/dL (ref 1.5–2.5)

## 2014-05-18 LAB — GLUCOSE, CAPILLARY: Glucose-Capillary: 119 mg/dL — ABNORMAL HIGH (ref 70–99)

## 2014-05-18 MED ORDER — ATENOLOL 25 MG PO TABS
25.0000 mg | ORAL_TABLET | Freq: Two times a day (BID) | ORAL | Status: DC
  Filled 2014-05-18 (×2): qty 1

## 2014-05-18 MED ORDER — OMEPRAZOLE 20 MG PO CPDR
40.0000 mg | DELAYED_RELEASE_CAPSULE | Freq: Every day | ORAL | Status: DC
  Administered 2014-05-18: 40 mg via ORAL
  Filled 2014-05-18 (×4): qty 2

## 2014-05-18 MED ORDER — METOPROLOL TARTRATE 12.5 MG HALF TABLET
12.5000 mg | ORAL_TABLET | Freq: Two times a day (BID) | ORAL | Status: DC
  Administered 2014-05-18 – 2014-05-19 (×2): 12.5 mg via ORAL
  Filled 2014-05-18 (×4): qty 1

## 2014-05-18 MED ORDER — SODIUM CHLORIDE 0.9 % IV SOLN
INTRAVENOUS | Status: AC
  Administered 2014-05-18: 11:00:00 via INTRAVENOUS

## 2014-05-18 NOTE — Care Management Note (Signed)
    Page 1 of 1   05/19/2014     3:14:51 PM CARE MANAGEMENT NOTE 05/19/2014  Patient:  Paul Burnett, Paul Burnett   Account Number:  [id number]  Date Initiated:  05/18/2014  Documentation initiated by:  Anali Cabanilla  Subjective/Objective Assessment:   Pt adm on 05/17/14 with SVT, H1N1 flu.  PTA, pt resides at home alone.     Action/Plan:   Will follow for dc needs as pt progresses.   Anticipated DC Date:  05/21/2014   Anticipated DC Plan:  [place]  CM consult      Choice offered to / List presented to:             Status of service:  Completed, signed off Medicare Important Message given?  NA - LOS <3 / Initial given by admissions (If response is "NO", the following Medicare IM given date fields will be blank) Date Medicare IM given:   Medicare IM given by:   Date Additional Medicare IM given:   Additional Medicare IM given by:    Discharge Disposition:  HOME/SELF CARE  Per UR Regulation:  Reviewed for med. necessity/level of care/duration of stay  If discussed at [place] of Stay Meetings, dates discussed:    Comments:  05/19/14 Ellan Lambert, RN,BSN [phone] PT recommending HHPT at dc.  Pt's son is an Doctor, general practice, and has his own rehab facility at his office.  Son states pt will come to his office for PT upon dc, as he has done in the past.

## 2014-05-18 NOTE — Progress Notes (Signed)
Patient Demographics  Paul Burnett, is a 81 y.o. male, DOB - 1924-09-01, QZE:[id number]  Admit date - 05/17/2014   Admitting Physician Kelvin Cellar, MD  Outpatient Primary MD for the patient is Mathews Argyle, MD  LOS - 1   Chief Complaint  Patient presents with  . Tachycardia      Admission history of present illness/brief narrative: Paul Burnett is a 81 y.o. male patient is a pleasant 81 year old gentleman with a past medical history of SVT, brought to the emergent apartment by EMS from his home. He reported yesterday evening feeling "sick" having cough, subjective fevers, shortness of breath, malaise, generalized weakness. He woke up this morning continued to feel ill, spoke with his family members who called EMS. Upon arrival he was found to be hypotensive with blood pressures , having a heart rate of 163 in SVT. Patient was given 6 mg of adenosine  with conversion to normal sinus rhythm and improvement to blood pressures. Patient will When back into SVT with hypotension having blood pressure of 81/58. He was given another dose of 6 mg of adenosine converting back to normal sinus rhythm. Lab work in the emergency department revealed a white count of 5100 with chest x-ray not showing acute Eulas Post pulmonary disease. Patient had fever of 100.9, his H1 N1 came back positive.     Subjective:   Paul Burnett today has, No headache, No chest pain, No abdominal pain - No Nausea, have generalized weakness, reports cough, but no productive sputum  Assessment & Plan    Principal Problem:   SVT (supraventricular tachycardia) Active Problems:   Essential hypertension   Coronary atherosclerosis   COPD (chronic obstructive pulmonary disease)   CKD (chronic kidney disease), stage III  SVT - Cardiology input  appreciated, most likely related to PATs. - Atenolol changed to metoprolol - Management per cardiology.  Hypertension - Blood pressure still on the lower side, continue to hold antihypertensive medication ( but will continue on metoprolol for SVTs)  Chronic kidney disease - Pain appears to be at baseline, 1.8-1.9. - Continue with gentle hydration, continue to hold our and diuretic regimen.  Questionable community-acquired pneumonia - We'll repeat chest x-ray, no evidence of opacity or infiltrate, will discontinue IV antibiotics. - Blood cultures no growth to date  Influenza A/H1 N1 - Continue with Tamiflu  Diabetes mellitus - Patient is on metformin ER at home, is refusing insulin sliding scale during hospital stay ( reports it gives him profound hypo-glycemia), so far his CBGs has been controlled, will continue to monitor without coverage, can resume on metformin ER during hospital stay giving his renal function.  Code Status: Full code  Family Communication: None at bedside  Disposition Plan: Home once stable, PT consulted   Procedures  None   Consults   Cardiology   Medications  Scheduled Meds: . aspirin EC  81 mg Oral QODAY  . azithromycin  500 mg Intravenous Q24H  . cefTRIAXone (ROCEPHIN)  IV  1 g Intravenous Q24H  . metoprolol tartrate  12.5 mg Oral BID  . oseltamivir  30 mg Oral QHS  . sodium chloride  3 mL Intravenous Q12H   Continuous Infusions: . sodium chloride     PRN Meds:.acetaminophen **OR** acetaminophen, alum &  mag hydroxide-simeth, ondansetron **OR** ondansetron (ZOFRAN) IV  DVT Prophylaxis  SCDs ( refused subcutaneous heparin)  Lab Results  Component Value Date   PLT 111* 05/18/2014    Antibiotics    Anti-infectives    Start     Dose/Rate Route Frequency Ordered Stop   05/18/14 1400  vancomycin (VANCOCIN) IVPB 1000 mg/200 mL premix  Status:  Discontinued     1,000 mg 200 mL/hr over 60 Minutes Intravenous Every 24 hours 05/17/14 1405  05/17/14 1607   05/17/14 2230  piperacillin-tazobactam (ZOSYN) IVPB 3.375 g  Status:  Discontinued     3.375 g 12.5 mL/hr over 240 Minutes Intravenous Every 8 hours 05/17/14 1404 05/17/14 1607   05/17/14 2200  oseltamivir (TAMIFLU) capsule 75 mg  Status:  Discontinued     75 mg Oral 2 times daily 05/17/14 1938 05/17/14 2000   05/17/14 2200  oseltamivir (TAMIFLU) capsule 30 mg  Status:  Discontinued     30 mg Oral 2 times daily 05/17/14 2000 05/17/14 2011   05/17/14 2200  oseltamivir (TAMIFLU) capsule 30 mg     30 mg Oral Daily at bedtime 05/17/14 2011 05/22/14 2159   05/17/14 1700  cefTRIAXone (ROCEPHIN) 1 g in dextrose 5 % 50 mL IVPB - Premix     1 g 100 mL/hr over 30 Minutes Intravenous Every 24 hours 05/17/14 1607     05/17/14 1700  azithromycin (ZITHROMAX) 500 mg in dextrose 5 % 250 mL IVPB     500 mg 250 mL/hr over 60 Minutes Intravenous Every 24 hours 05/17/14 1607     05/17/14 1300  piperacillin-tazobactam (ZOSYN) IVPB 3.375 g     3.375 g 100 mL/hr over 30 Minutes Intravenous  Once 05/17/14 1255 05/17/14 1449   05/17/14 1300  vancomycin (VANCOCIN) IVPB 1000 mg/200 mL premix     1,000 mg 200 mL/hr over 60 Minutes Intravenous  Once 05/17/14 1255 05/17/14 1550          Objective:   Filed Vitals:   05/17/14 2156 05/17/14 2259 05/18/14 0118 05/18/14 0552  BP: 112/49  104/53   Pulse: 58 52 67 60  Temp: 97.7 F (36.5 C)  98.2 F (36.8 C) 98 F (36.7 C)  TempSrc: Oral  Oral Oral  Resp: 20  18   Height:      Weight:    60.374 kg (133 lb 1.6 oz)  SpO2: 96%  94%     Wt Readings from Last 3 Encounters:  05/18/14 60.374 kg (133 lb 1.6 oz)  08/09/13 64.411 kg (142 lb)  02/01/13 65.772 kg (145 lb)     Intake/Output Summary (Last 24 hours) at 05/18/14 1034 Last data filed at 05/18/14 0809  Gross per 24 hour  Intake    350 ml  Output    450 ml  Net   -100 ml     Physical Exam  Awake Alert, Oriented X 3, No new F.N deficits, Normal affect [place].AT,PERRAL Supple  Neck,No JVD, No cervical lymphadenopathy appriciated.  Symmetrical Chest wall movement, Good air movement bilaterally,  RRR,No Gallops,Rubs or new Murmurs, No Parasternal Heave +ve B.Sounds, Abd Soft, No tenderness, No organomegaly appriciated, No rebound - guarding or rigidity. No Cyanosis, Clubbing or edema, No new Rash or bruise     Data Review   Micro Results Recent Results (from the past 240 hour(s))  Culture, blood (routine x 2)     Status: None (Preliminary result)   Collection Time: 05/17/14  1:15 PM  Result Value Ref Range  Status   Specimen Description BLOOD LEFT ANTECUBITAL  Final   Special Requests BOTTLES DRAWN AEROBIC AND ANAEROBIC 5MLS  Final   Culture   Final           BLOOD CULTURE RECEIVED NO GROWTH TO DATE CULTURE WILL BE HELD FOR 5 DAYS BEFORE ISSUING A FINAL NEGATIVE REPORT Performed at [facility]    Report Status PENDING  Incomplete  Culture, blood (routine x 2)     Status: None (Preliminary result)   Collection Time: 05/17/14  2:10 PM  Result Value Ref Range Status   Specimen Description BLOOD LEFT FOREARM  Final   Special Requests BOTTLES DRAWN AEROBIC AND ANAEROBIC 5CC  Final   Culture   Final           BLOOD CULTURE RECEIVED NO GROWTH TO DATE CULTURE WILL BE HELD FOR 5 DAYS BEFORE ISSUING A FINAL NEGATIVE REPORT Performed at [facility]    Report Status PENDING  Incomplete    Radiology Reports Dg Chest Port 1 View  05/17/2014   CLINICAL DATA:  Cough and fever.  History of COPD.  EXAM: PORTABLE CHEST - 1 VIEW  COMPARISON:  PA and lateral chest 11/25/2010.  FINDINGS: The lungs are clear. Heart size is normal. No pneumothorax or pleural effusion. Lung volumes are somewhat low.  IMPRESSION: No acute disease.   Electronically Signed   By: Inge Rise M.D.   On: 05/17/2014 12:33    CBC  Recent Labs Lab 05/17/14 1224 05/17/14 1300 05/17/14 1715 31/51/76 1607  WBC DUPLICATE REQUEST 5.1 5.9 4.3  HGB  --  9.5* 9.9* 9.5*  HCT  --   28.3* 29.2* 27.9*  PLT  --  126* 126* 111*  MCV  --  100.4* 99.7 98.6  MCH  --  33.7 33.8 33.6  MCHC  --  33.6 33.9 34.1  RDW  --  14.8 15.0 15.0  LYMPHSABS  --  0.3*  --   --   MONOABS  --  0.2  --   --   EOSABS  --  0.0  --   --   BASOSABS  --  0.0  --   --     Chemistries   Recent Labs Lab 05/17/14 1224 05/17/14 1715 05/18/14 0614  NA 140  --  138  K 4.1  --  4.0  CL 104  --  105  CO2 27  --  24  GLUCOSE 175*  --  126*  BUN 39*  --  33*  CREATININE 1.97* 1.97* 1.84*  CALCIUM 8.5  --  8.1*  MG  --   --  1.7  AST 27  --   --   ALT 14  --   --   ALKPHOS 89  --   --   BILITOT 1.3*  --   --    ------------------------------------------------------------------------------------------------------------------ estimated creatinine clearance is 24.2 mL/min (by C-G formula based on Cr of 1.84). ------------------------------------------------------------------------------------------------------------------ No results for input(s): HGBA1C in the last 72 hours. ------------------------------------------------------------------------------------------------------------------ No results for input(s): CHOL, HDL, LDLCALC, TRIG, CHOLHDL, LDLDIRECT in the last 72 hours. ------------------------------------------------------------------------------------------------------------------  Recent Labs  05/17/14 1715  TSH 0.918   ------------------------------------------------------------------------------------------------------------------ No results for input(s): VITAMINB12, FOLATE, FERRITIN, TIBC, IRON, RETICCTPCT in the last 72 hours.  Coagulation profile No results for input(s): INR, PROTIME in the last 168 hours.  No results for input(s): DDIMER in the last 72 hours.  Cardiac Enzymes No results for input(s): CKMB, TROPONINI, MYOGLOBIN in the  last 168 hours.  Invalid input(s):  CK ------------------------------------------------------------------------------------------------------------------ Invalid input(s): POCBNP     Time Spent in minutes   30 minutes   Jhanvi Drakeford M.D on 05/18/2014 at 10:34 AM  Between 7am to 7pm - Pager - [phone]  After 7pm go to [email or web address] - password [email or web address]  And look for the night coverage person covering for me after hours  [facility] Office  [phone]   **Disclaimer: This note may have been dictated with voice recognition software. Similar sounding words can inadvertently be transcribed and this note may contain transcription errors which may not have been corrected upon publication of note.**

## 2014-05-18 NOTE — Clinical Documentation Improvement (Signed)
Supporting Information: Found to be hypotensive, c/o subjective fevers, concerns for clinical pre-septic picture, will cover with empiric IV antimicrobials, per 3/09 progress notes.   Labs: 05/17/14: Lactic acid: 3.17.    Possible Clinical Condition? . Bacteremia (positive blood cultures only) . Sepsis with UTI, versus UTI only . Sepsis-specify causative organism if known . Sepsis due to: --Device --Implant --Graft --Infusion . Severe sepsis-sepsis with organ dysfunction --Specify organ dysfunction Respiratory failure Encephalopathy Acute kidney failure Other (specify) . SIRS (Systemic Inflammatory Response Syndrome --With or without organ dysfunction . Document septic shock if present . Document any associated diagnoses/conditions    Thank Paul Burnett Documentation Specialist [phone] [email or web address]

## 2014-05-18 NOTE — Progress Notes (Signed)
PATIENT PROFILE: Paul Burnett is a 81 y.o. male who developed recurrent episode of SVT - history of type 2 diabetes mellitus, hypertension , renal insufficiency with stage III chronic disease and history of SVT. DX with CAP.  Subjective: Bradycardia to 30s at times, denies chest pain, cough productive now.   Objective: Vital signs in last 24 hours: Temp:  [97.7 F (36.5 C)-100.9 F (38.3 C)] 98 F (36.7 C) (03/10 0552) Pulse Rate:  [52-85] 60 (03/10 0552) Resp:  [18-35] 18 (03/10 0118) BP: (101-122)/(45-67) 104/53 mmHg (03/10 0118) SpO2:  [91 %-96 %] 94 % (03/10 0118) Weight:  [133 lb 1.6 oz (60.374 kg)-138 lb 4.8 oz (62.732 kg)] 133 lb 1.6 oz (60.374 kg) (03/10 0552) Weight change:  Last BM Date: 05/16/14 Intake/Output from previous day: 03/09 0701 - 03/10 0700 In: 290 [P.O.:240; IV Piggyback:50] Out: 450 [Urine:450] Intake/Output this shift: Total I/O In: 60 [P.O.:60] Out: -   PE: General:Pleasant affect, NAD, harsh cough Skin:very Warm and dry, brisk capillary refill HEENT:normocephalic, sclera clear, mucus membranes moist Heart:S1S2 RRR without murmur, gallup, rub or click Lungs:diminished bases bil, without rales, rhonchi, or wheezes LYY:TKPT, non tender, + BS, do not palpate liver spleen or masses Ext:no lower ext edema, 2+ pedal pulses, 2+ radial pulses Neuro:alert and oriented X 3, MAE, follows commands, + facial symmetry  TELE:  SR, SB at 30 , SVT  several episodes   Lab Results:  Recent Labs  05/17/14 1715 05/18/14 0614  WBC 5.9 4.3  HGB 9.9* 9.5*  HCT 29.2* 27.9*  PLT 126* 111*   BMET  Recent Labs  05/17/14 1224 05/17/14 1715 05/18/14 0614  NA 140  --  138  K 4.1  --  4.0  CL 104  --  105  CO2 27  --  24  GLUCOSE 175*  --  126*  BUN 39*  --  33*  CREATININE 1.97* 1.97* 1.84*  CALCIUM 8.5  --  8.1*   No results for input(s): TROPONINI in the last 72 hours.  Invalid input(s): CK, MB  No results found for: CHOL, HDL,  LDLCALC, LDLDIRECT, TRIG, CHOLHDL No results found for: HGBA1C   Lab Results  Component Value Date   TSH 0.918 05/17/2014    Hepatic Function Panel  Recent Labs  05/17/14 1224  PROT 6.3  ALBUMIN 3.3*  AST 27  ALT 14  ALKPHOS 89  BILITOT 1.3*   No results for input(s): CHOL in the last 72 hours. No results for input(s): PROTIME in the last 72 hours.     Studies/Results: Dg Chest Port 1 View  05/17/2014   CLINICAL DATA:  Cough and fever.  History of COPD.  EXAM: PORTABLE CHEST - 1 VIEW  COMPARISON:  PA and lateral chest 11/25/2010.  FINDINGS: The lungs are clear. Heart size is normal. No pneumothorax or pleural effusion. Lung volumes are somewhat low.  IMPRESSION: No acute disease.   Electronically Signed   By: Inge Rise M.D.   On: 05/17/2014 12:33    Medications: I have reviewed the patient's current medications. Scheduled Meds: . sodium chloride   Intravenous STAT  . aspirin EC  81 mg Oral QODAY  . atenolol  25 mg Oral Once  . atenolol  50 mg Oral BID  . azithromycin  500 mg Intravenous Q24H  . cefTRIAXone (ROCEPHIN)  IV  1 g Intravenous Q24H  . insulin aspart  0-15 Units Subcutaneous TID WC  . insulin aspart  0-5  Units Subcutaneous QHS  . oseltamivir  30 mg Oral QHS  . sodium chloride  3 mL Intravenous Q12H   Continuous Infusions:  PRN Meds:.acetaminophen **OR** acetaminophen, alum & mag hydroxide-simeth, ondansetron **OR** ondansetron (ZOFRAN) IV  Assessment/Plan: 1. SVT: patient has been on atenolol 50 mg bid for many years and has been stable.His current episode most likely has been exacerbated by his fever/infectious process. He is now in sinus rhythm and we had been continuing atenolol 50 mg twice a day- but has been held due to sinus brady to the 30s.   complicated with known Bifascicular block.  Continues with short bursts of SVT  Did receive adenosine by EMS X 2 rec'd one dose of atenolol yesterday 50 mg will decrease to 25 BID with the brady.  No hx  OSA.  ? EP onsult  2. HTN: He previously has been on atenolol 50 g twice a day, losartan 100 mg daily in addition to hydrochlorothiazide 25 mg. With recent low blood pressure agree with holding his losartan and diuretic regimen.  3. Renal insufficiency, creatinine is 1.97 --> 1.84. This gives him a GFR of 31, which places him at the start of stage III-IV renal insufficiency. Will presently hold ARB and diuretic regimen. I would not reinstitute diuretic therapy and depending upon renal function dose of ARB may need to be reduced or discontinued.  4. Possible community-acquired pneumonia. Cultures obtained. He has received a dose of vancomycin.on rocephin and azithromycin - CXR clear yesterday  5. Anemia/Thrombocytopenia with history of monoclonal gammopathy.H/H 9.5/27.9  6. 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 This is old.   7.  Influenza A-  On Tamiflu , fever yesterday   LOS: 1 day   Time spent with pt. :15 minutes. [facility] R  Nurse Practitioner Certified Pager [phone] or after 5pm and on weekends call [phone] 05/18/2014, 8:15 AM   Personally seen and examined. Agree with above. SVT on tele at 530am 3/10 looks like PAT with variable conduction.  Will change atenolol to metoprolol because of renal function.  If continues tachy brady will ask EP to see. Hopefully as he improve from flu this will improve.   Candee Furbish, MD

## 2014-05-18 NOTE — Progress Notes (Signed)
K.Kirby notified for parameters for qhs atenolol as patient's heart rate was sustaining in the 50's. Dose reduced and rescheduled for 0000. Once patient was asleep, heart rate dropped into the 30's-unsustained. K.Kirby again notified, received order to hold tonight's dose of atenolol.  Patient remained asymptomatic throughout. Will continue to monitor. Tresa Endo

## 2014-05-18 NOTE — Evaluation (Signed)
Physical Therapy Evaluation Patient Details Name: Paul Burnett MRN: [id number] DOB: [birth year] Today's Date: 05/18/2014   History of Present Illness  81 y.o. male who developed recurrent episode of SVT. Pt reports generalized weakness, SOB, subjective fevers and fatigue, currently undergoing workup for PNA.  Clinical Impression  Patient demonstrates deficits in functional mobility as indicated below. Will need continued skilled PT to address deficits and maximize function. Will see as indicated and progress as tolerated. OF NOTE: VSS throughout session, HR stable 90s-100s    Follow Up Recommendations Home health PT;Supervision/Assistance - 24 hour (pt may decline HHPT rec and request to defer to Outpatient)    Equipment Recommendations       Recommendations for Other Services       Precautions / Restrictions Precautions Precautions: Fall Restrictions Weight Bearing Restrictions: No      Mobility  Bed Mobility Overal bed mobility: Needs Assistance Bed Mobility: Supine to Sit;Sit to Supine     Supine to sit: Min guard Sit to supine: Min guard   General bed mobility comments: increased time to perform, poor awareness of lines, rushing to get back to bed, stepping on condom catheter line   Transfers Overall transfer level: Needs assistance Equipment used: 1 person hand held assist Transfers: Sit to/from Stand Sit to Stand: Min assist         General transfer comment: Min assist for stability, patient with increased lateral sway and poor control   Ambulation/Gait Ambulation/Gait assistance: Min assist Ambulation Distance (Feet): 110 Feet Assistive device: None Gait Pattern/deviations: Step-through pattern;Decreased stride length;Staggering left;Staggering right;Narrow base of support Gait velocity: impulsive and fast   General Gait Details: patient very impuslive with ambulation, bumping into walls at times, and ambulating with increased fwd momtentum and poorly  controlled cadence and overall qulaity of gait. min assist for stability  Stairs            Wheelchair Mobility    Modified Rankin (Stroke Patients Only)       Balance Overall balance assessment: Needs assistance Sitting-balance support: Feet supported Sitting balance-Leahy Scale: Good     Standing balance support: No upper extremity supported Standing balance-Leahy Scale: Poor Standing balance comment: instability noted, poor static postural control             High level balance activites: Direction changes;Turns High Level Balance Comments: min to moderate assist for stability             Pertinent Vitals/Pain Pain Assessment: No/denies pain    Home Living Family/patient expects to be discharged to:: Private residence Living Arrangements: Alone ("put wife in nursing home 3 wks ago") Available Help at Discharge: Family Type of Home: House Home Access: Ramped entrance     Home Layout: One level Home Equipment: Environmental consultant - 2 wheels;Cane - single point      Prior Function Level of Independence: Independent               Hand Dominance   Dominant Hand: Right    Extremity/Trunk Assessment   Upper Extremity Assessment: Generalized weakness           Lower Extremity Assessment: Generalized weakness         Communication   Communication: HOH  Cognition Arousal/Alertness: Awake/alert Behavior During Therapy: Impulsive Overall Cognitive Status: No family/caregiver present to determine baseline cognitive functioning Area of Impairment: Safety/judgement         Safety/Judgement: Decreased awareness of safety          General Comments  Exercises        Assessment/Plan    PT Assessment Patient needs continued PT services  PT Diagnosis Difficulty walking;Abnormality of gait;Generalized weakness   PT Problem List Decreased strength;Decreased activity tolerance;Decreased balance;Decreased mobility;Decreased  coordination;Decreased safety awareness;Cardiopulmonary status limiting activity  PT Treatment Interventions DME instruction;Gait training;Functional mobility training;Therapeutic activities;Therapeutic exercise;Balance training;Patient/family education   PT Goals (Current goals can be found in the Care Plan section) Acute Rehab PT Goals Patient Stated Goal: to go home PT Goal Formulation: With patient Time For Goal Achievement: 06/01/14 Potential to Achieve Goals: Good    Frequency Min 3X/week   Barriers to discharge        Co-evaluation               End of Session Equipment Utilized During Treatment: Gait belt Activity Tolerance: Patient tolerated treatment well Patient left: in bed;with call bell/phone within reach;with bed alarm set Nurse Communication: Mobility status         Time: 8242-3536 PT Time Calculation (min) (ACUTE ONLY): 19 min   Charges:   PT Evaluation $Initial PT Evaluation Tier I: 1 Procedure     PT G CodesDuncan Dull 05/23/2014, 3:32 PM Alben Deeds, [place] DPT  [phone]

## 2014-05-19 DIAGNOSIS — R000 Tachycardia, unspecified: Secondary | ICD-10-CM

## 2014-05-19 DIAGNOSIS — I10 Essential (primary) hypertension: Secondary | ICD-10-CM

## 2014-05-19 DIAGNOSIS — I471 Supraventricular tachycardia: Secondary | ICD-10-CM

## 2014-05-19 LAB — CBC
HCT: 26.4 % — ABNORMAL LOW (ref 39.0–52.0)
Hemoglobin: 8.9 g/dL — ABNORMAL LOW (ref 13.0–17.0)
MCH: 33.1 pg (ref 26.0–34.0)
MCHC: 33.7 g/dL (ref 30.0–36.0)
MCV: 98.1 fL (ref 78.0–100.0)
PLATELETS: 104 10*3/uL — AB (ref 150–400)
RBC: 2.69 MIL/uL — ABNORMAL LOW (ref 4.22–5.81)
RDW: 14.7 % (ref 11.5–15.5)
WBC: 3.1 10*3/uL — ABNORMAL LOW (ref 4.0–10.5)

## 2014-05-19 LAB — BASIC METABOLIC PANEL
Anion gap: 11 (ref 5–15)
BUN: 26 mg/dL — AB (ref 6–23)
CHLORIDE: 106 mmol/L (ref 96–112)
CO2: 23 mmol/L (ref 19–32)
Calcium: 7.9 mg/dL — ABNORMAL LOW (ref 8.4–10.5)
Creatinine, Ser: 1.71 mg/dL — ABNORMAL HIGH (ref 0.50–1.35)
GFR calc Af Amer: 40 mL/min — ABNORMAL LOW (ref >90)
GFR calc non Af Amer: 34 mL/min — ABNORMAL LOW (ref >90)
GLUCOSE: 109 mg/dL — AB (ref 70–99)
Potassium: 4 mmol/L (ref 3.5–5.1)
SODIUM: 140 mmol/L (ref 135–145)

## 2014-05-19 LAB — GLUCOSE, CAPILLARY
GLUCOSE-CAPILLARY: 109 mg/dL — AB (ref 70–99)
GLUCOSE-CAPILLARY: 120 mg/dL — AB (ref 70–99)

## 2014-05-19 MED ORDER — LOSARTAN POTASSIUM 100 MG PO TABS: 100.0000 mg | ORAL_TABLET | Freq: Every day | ORAL | Status: DC

## 2014-05-19 MED ORDER — METFORMIN HCL ER 750 MG PO TB24: 750.0000 mg | ORAL_TABLET | Freq: Every day | ORAL | Status: DC

## 2014-05-19 MED ORDER — OSELTAMIVIR PHOSPHATE 30 MG PO CAPS: 30.0000 mg | ORAL_CAPSULE | Freq: Every day | ORAL | Status: AC

## 2014-05-19 MED ORDER — METOPROLOL TARTRATE 25 MG PO TABS: 12.5000 mg | ORAL_TABLET | Freq: Two times a day (BID) | ORAL | Status: DC

## 2014-05-19 MED ORDER — OMEPRAZOLE 40 MG PO CPDR
40.0000 mg | DELAYED_RELEASE_CAPSULE | ORAL | Status: DC
Start: 2014-05-19 — End: 2015-06-15

## 2014-05-19 MED ORDER — HYDROCHLOROTHIAZIDE 25 MG PO TABS: 12.5000 mg | ORAL_TABLET | Freq: Every day | ORAL | Status: DC

## 2014-05-19 MED ORDER — GLUCOSE BLOOD VI STRP: ORAL_STRIP | Status: DC

## 2014-05-19 MED ORDER — FREESTYLE SYSTEM KIT: 1.0000 | PACK | Status: DC | PRN

## 2014-05-19 NOTE — Progress Notes (Signed)
PATIENT PROFILE: Paul Burnett is a 81 y.o. male who developed recurrent episode of SVT - history of type 2 diabetes mellitus, hypertension , renal insufficiency with stage III chronic disease and history of SVT. DX with CAP.  Subjective: Feels better. Likely weak because of flu. No SOB. No CP  Objective: Vital signs in last 24 hours: Temp:  [97.9 F (36.6 C)-99.5 F (37.5 C)] 98.2 F (36.8 C) (03/11 1003) Pulse Rate:  [53-91] 69 (03/11 1003) Resp:  [18-21] 18 (03/11 1003) BP: (113-132)/(52-72) 132/66 mmHg (03/11 1003) SpO2:  [91 %-100 %] 100 % (03/11 1003) Weight:  [133 lb 4.8 oz (60.464 kg)] 133 lb 4.8 oz (60.464 kg) (03/11 0459) Weight change: -5 lb (-2.268 kg) Last BM Date: 05/18/14 Intake/Output from previous day: 03/10 0701 - 03/11 0700 In: 2099.2 [P.O.:1140; I.V.:959.2] Out: 750 [Urine:750] Intake/Output this shift: Total I/O In: 240 [P.O.:240] Out: 200 [Urine:200]  PE: General:Pleasant affect, NAD, harsh cough Skin:very Warm and dry, brisk capillary refill HEENT:normocephalic, sclera clear, mucus membranes moist Heart:S1S2 RRR without murmur, gallup, rub or click Lungs:diminished bases bil, without rales, rhonchi, or wheezes IPJ:ASNK, non tender, + BS, do not palpate liver spleen or masses Ext:no lower ext edema, 2+ pedal pulses, 2+ radial pulses Neuro:alert and oriented X 3, MAE, follows commands, + facial symmetry  TELE:  SR, 50 during sleep, no further SVT (? PAT previously).    Lab Results:  Recent Labs  05/18/14 0614 05/19/14 0726  WBC 4.3 3.1*  HGB 9.5* 8.9*  HCT 27.9* 26.4*  PLT 111* 104*   BMET  Recent Labs  05/18/14 0614 05/19/14 0726  NA 138 140  K 4.0 4.0  CL 105 106  CO2 24 23  GLUCOSE 126* 109*  BUN 33* 26*  CREATININE 1.84* 1.71*  CALCIUM 8.1* 7.9*    Lab Results  Component Value Date   TSH 0.918 05/17/2014    Hepatic Function Panel  Recent Labs  05/17/14 1224  PROT 6.3  ALBUMIN 3.3*  AST 27  ALT 14    ALKPHOS 89  BILITOT 1.3*     Studies/Results: Dg Chest 2 View  05/18/2014   CLINICAL DATA:  Cough, shortness breath, flu symptoms, and weakness.  EXAM: CHEST  2 VIEW  COMPARISON:  05/17/2014  FINDINGS: Anterior eventration of the right hemidiaphragm is again seen. The cardiomediastinal silhouette is within normal limits. Thoracic aortic calcification is noted. The patient has taken a greater inspiration than on the prior study. No airspace consolidation, edema, pleural effusion, or pneumothorax is identified. No acute osseous abnormality is identified.  IMPRESSION: Improved inspiration without evidence of active cardiopulmonary disease.   Electronically Signed   By: Logan Bores   On: 05/18/2014 14:23   Dg Chest Port 1 View  05/17/2014   CLINICAL DATA:  Cough and fever.  History of COPD.  EXAM: PORTABLE CHEST - 1 VIEW  COMPARISON:  PA and lateral chest 11/25/2010.  FINDINGS: The lungs are clear. Heart size is normal. No pneumothorax or pleural effusion. Lung volumes are somewhat low.  IMPRESSION: No acute disease.   Electronically Signed   By: Inge Rise M.D.   On: 05/17/2014 12:33    Medications: I have reviewed the patient's current medications. Scheduled Meds: . aspirin EC  81 mg Oral QODAY  . metoprolol tartrate  12.5 mg Oral BID  . omeprazole  40 mg Oral Daily  . oseltamivir  30 mg Oral QHS  . sodium chloride  3 mL  Intravenous Q12H   Continuous Infusions: . sodium chloride 50 mL/hr at 05/18/14 1049   PRN Meds:.acetaminophen **OR** acetaminophen, alum & mag hydroxide-simeth, ondansetron **OR** ondansetron (ZOFRAN) IV  Assessment/Plan: 1. SVT: patient has been on atenolol 50 mg bid for many years and has been stable.His current episode most likely has been exacerbated by his fever/infectious process. He is now in sinus rhythm and we had been continuing atenolol 50 mg twice a day- but has been held due to sinus brady to the 30s.   complicated with known Bifascicular block.  SVT  no longer. Looked like PAT previously.    Did receive adenosine by EMS X 2  Did well overnight. Continue metoprolol 12.5 BID.   2. HTN: He previously has been on atenolol 50 g twice a day, losartan 100 mg daily in addition to hydrochlorothiazide 25 mg. With recent low blood pressure agree with holding his losartan and diuretic regimen.  3. Renal insufficiency, creatinine is 1.97 --> 1.84. This gives him a GFR of 31, which places him at the start of stage III-IV renal insufficiency. Will presently hold ARB and diuretic regimen. I would not reinstitute diuretic therapy and depending upon renal function dose of ARB may need to be reduced or discontinued.  4. Possible community-acquired pneumonia. Cultures obtained. He has received a dose of vancomycin.on rocephin and azithromycin - CXR clear yesterday  5. Anemia/Thrombocytopenia with history of monoclonal gammopathy.H/H 9.5/27.9  6. 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 This is old.   7.  Influenza A-  On Tamiflu , fever yesterday resolved    Candee Furbish, MD

## 2014-05-19 NOTE — Progress Notes (Signed)
Pt discharged to home with father. Pt. Is alert and oriented. Pt is hemodynamically stable. AVS reviewed with pt. Capable of re verbalizing medication regimen. IV removed from right ac. Discharge plan appropriate and in place.

## 2014-05-19 NOTE — Discharge Summary (Addendum)
Paul Burnett, 81 y.o., DOB [birth year], MRN [id number]. Admission date: 05/17/2014 Discharge Date 05/19/2014 Primary MD Mathews Argyle, MD Admitting Physician Kelvin Cellar, MD   PCP please follow-up on: - Check CBC, BMP, chest x-ray your next visit, patient is finishing treatment for H1N1 flu, creatinine is 1.7 at day of discharge, patient was instructed to hold hydrochlorothiazide and Cozaar for the next 3 days. - Patient to follow with cardiology as an outpatient on discharge, phenol has been switched to metoprolol 12.5 mg oral twice a day, and that's for PAT . - Patient metformin has been hold on discharge, given his chronic kidney disease, and his CBGs has been controlled during hospital stay without any intervention, patient has been instructed to check his CBGs, and to follow with you next week, please address if he needs to be resumed on oral hypoglycemic agents.  Admission Diagnosis  Cough [R05] SVT (supraventricular tachycardia) [I47.1] URI (upper respiratory infection) [J06.9] Fever, unspecified fever cause [R50.9]  Discharge Diagnosis   Principal Problem:   SVT (supraventricular tachycardia) Active Problems:   Essential hypertension   Coronary atherosclerosis   COPD (chronic obstructive pulmonary disease)   CKD (chronic kidney disease), stage III   Past Medical History  Diagnosis Date  . Spinal stenosis   . Peripheral neuropathy   . Degenerative joint disease   . pulmonary hypertension   . Hypertension   . Pulmonary hypertension   . Monoclonal gammopathy   . Coronary artery disease   . Anemia   . GERD (gastroesophageal reflux disease)   . Hiatal hernia   . DYSPNEA ON EXERTION 07/19/2008  . MGUS (monoclonal gammopathy of unknown significance) 01/31/2014    IgA kappa dx 07/22/10   . CKD (chronic kidney disease), stage III 05/17/2014  . SVT (supraventricular tachycardia) hospitalized 05/17/2014  . Prostate cancer   . Type II diabetes mellitus     Past Surgical  History  Procedure Laterality Date  . Cholecystectomy    . Colon surgery    . Laminectomy  x 4 last sx 1960's  . Coronary angioplasty    . Total hip arthroplasty Right 11/18/2010  . Joint replacement       Hospital Course See H&P, Labs, Consult and Test reports for all details in brief, patient was admitted for **  Principal Problem:   SVT (supraventricular tachycardia) Active Problems:   Essential hypertension   Coronary atherosclerosis   COPD (chronic obstructive pulmonary disease)   CKD (chronic kidney disease), stage III  Paul Burnett is a 81 y.o. male patient is a pleasant 81 year old gentleman with a past medical history of SVT, brought to the emergent apartment by EMS from his home. He reported yesterday evening feeling "sick" having cough, subjective fevers, shortness of breath, malaise, generalized weakness. He woke up this morning continued to feel ill, spoke with his family members who called EMS. Upon arrival he was found to be hypotensive with blood pressures , having a heart rate of 163 in SVT. Patient was given 6 mg of adenosine with conversion to normal sinus rhythm and improvement to blood pressures. Patient will When back into SVT with hypotension having blood pressure of 81/58. He was given another dose of 6 mg of adenosine converting back to normal sinus rhythm. Lab work in the emergency department revealed a white count of 5100 with chest x-ray not showing acute Eulas Post pulmonary disease. Patient had fever of 100.9, his H1 N1 came back positive. SVT - patient was on atenolol 50 mg bid for  many years .current episode most likely has been exacerbated by his fever/infectious process. - atenolol has been held given bradycardia, seen by cardiology who did recommend change to low-dose metoprolol 12.5 mg by mouth twice a day ,  -Did receive adenosine by EMS X 2   Hypertension - Initially blood pressure on the lower side, currently is acceptable, not elevated,  instructed patient to hold his hydrochlorothiazide and Cozaar for 3 days .  Chronic kidney disease - creatinine Improved with hydration, creatinine is 1.7 at day of discharge .   suspicion of  community-acquired pneumonia .. unlikely - repeat x-ray showing opacity or infiltrate, IV antibiotics were stopped,  - Blood cultures no growth to date  Influenza A/H1 N1 - Continue with Tamiflu  Diabetes mellitus - Patient CBG has been acceptable during hospital stay, without any medication, patient was instructed to hold metformintill he is seen by his PCP.    Procedures  None   Consults  Cardiology    Significant Tests:  See full reports for all details    Dg Chest 2 View  05/18/2014   CLINICAL DATA:  Cough, shortness breath, flu symptoms, and weakness.  EXAM: CHEST  2 VIEW  COMPARISON:  05/17/2014  FINDINGS: Anterior eventration of the right hemidiaphragm is again seen. The cardiomediastinal silhouette is within normal limits. Thoracic aortic calcification is noted. The patient has taken a greater inspiration than on the prior study. No airspace consolidation, edema, pleural effusion, or pneumothorax is identified. No acute osseous abnormality is identified.  IMPRESSION: Improved inspiration without evidence of active cardiopulmonary disease.   Electronically Signed   By: Logan Bores   On: 05/18/2014 14:23   Dg Chest Port 1 View  05/17/2014   CLINICAL DATA:  Cough and fever.  History of COPD.  EXAM: PORTABLE CHEST - 1 VIEW  COMPARISON:  PA and lateral chest 11/25/2010.  FINDINGS: The lungs are clear. Heart size is normal. No pneumothorax or pleural effusion. Lung volumes are somewhat low.  IMPRESSION: No acute disease.   Electronically Signed   By: Inge Rise M.D.   On: 05/17/2014 12:33     Today   Subjective:   Paul Burnett today has no headache,no chest abdominal pain,no new weakness tingling or numbness, feels much better wants to go home today.   Objective:   Blood  pressure 132/66, pulse 69, temperature 98.2 F (36.8 C), temperature source Oral, resp. rate 18, height 5\' 7"  (1.702 m), weight 60.464 kg (133 lb 4.8 oz), SpO2 100 %.  Intake/Output Summary (Last 24 hours) at 05/19/14 1107 Last data filed at 05/19/14 0911  Gross per 24 hour  Intake 2039.17 ml  Output    950 ml  Net 1089.17 ml    Exam Awake Alert, Oriented *3, No new F.N deficits, Normal affect G[place]n.AT,PERRAL Supple Neck,No JVD, No cervical lymphadenopathy appriciated.  Symmetrical Chest wall movement, Good air movement bilaterally, CTAB RRR,No Gallops,Rubs or new Murmurs, No Parasternal Heave +ve B.Sounds, Abd Soft, Non tender, No organomegaly appriciated, No rebound -guarding or rigidity. No Cyanosis, Clubbing or edema, No new Rash or bruise  Data Review   Cultures -   CBC w Diff:  Lab Results  Component Value Date   WBC 3.1* 05/19/2014   WBC 5.5 07/22/2010   HGB 8.9* 05/19/2014   HGB 12.4* 07/22/2010   HCT 26.4* 05/19/2014   HCT 37.0* 07/22/2010   PLT 104* 05/19/2014   PLT 183 07/22/2010   LYMPHOPCT 6* 05/17/2014   LYMPHOPCT 25.4 07/22/2010  MONOPCT 3 05/17/2014   MONOPCT 4.7 07/22/2010   EOSPCT 0 05/17/2014   EOSPCT 1.1 07/22/2010   BASOPCT 0 05/17/2014   BASOPCT 0.2 07/22/2010   CMP:  Lab Results  Component Value Date   NA 140 05/19/2014   NA 144 02/06/2014   K 4.0 05/19/2014   K 4.4 02/06/2014   CL 106 05/19/2014   CO2 23 05/19/2014   CO2 26 02/06/2014   BUN 26* 05/19/2014   BUN 38.6* 02/06/2014   CREATININE 1.71* 05/19/2014   CREATININE 1.9* 02/06/2014   CREATININE 1.81* 02/06/2014   PROT 6.3 05/17/2014   ALBUMIN 3.3* 05/17/2014   BILITOT 1.3* 05/17/2014   ALKPHOS 89 05/17/2014   AST 27 05/17/2014   ALT 14 05/17/2014  .  Micro Results Recent Results (from the past 240 hour(s))  Urine culture     Status: None   Collection Time: 05/17/14 12:00 PM  Result Value Ref Range Status   Specimen Description URINE, RANDOM  Final   Special Requests  NONE  Final   Colony Count   Final    40,000 COLONIES/ML Performed at [facility]    Culture   Final    Multiple bacterial morphotypes present, none predominant. Suggest appropriate recollection if clinically indicated. Performed at [facility]    Report Status 05/18/2014 FINAL  Final  Culture, blood (routine x 2)     Status: None (Preliminary result)   Collection Time: 05/17/14  1:15 PM  Result Value Ref Range Status   Specimen Description BLOOD LEFT ANTECUBITAL  Final   Special Requests BOTTLES DRAWN AEROBIC AND ANAEROBIC 5MLS  Final   Culture   Final           BLOOD CULTURE RECEIVED NO GROWTH TO DATE CULTURE WILL BE HELD FOR 5 DAYS BEFORE ISSUING A FINAL NEGATIVE REPORT Performed at [facility]    Report Status PENDING  Incomplete  Culture, blood (routine x 2)     Status: None (Preliminary result)   Collection Time: 05/17/14  2:10 PM  Result Value Ref Range Status   Specimen Description BLOOD LEFT FOREARM  Final   Special Requests BOTTLES DRAWN AEROBIC AND ANAEROBIC 5CC  Final   Culture   Final           BLOOD CULTURE RECEIVED NO GROWTH TO DATE CULTURE WILL BE HELD FOR 5 DAYS BEFORE ISSUING A FINAL NEGATIVE REPORT Performed at [facility]    Report Status PENDING  Incomplete     Discharge Instructions          Follow-up Information    Follow up with Sinclair Grooms, MD On 06/27/2014.   Specialty:  Cardiology   Why:  at 2:30 pm, please arrive at 2:00 pm for paperwork.    Contact information:   [address] [place] [address] [phone]       Follow up with Mathews Argyle, MD. Schedule an appointment as soon as possible for a visit in 1 week.   Specialty:  Internal Medicine   Why:  Posthospitalization follow-up Left a message    Contact information:   [address] [place] [address] [phone]       Discharge Medications     Medication List    STOP taking these  medications        atenolol 50 MG tablet  Commonly known as:  TENORMIN      TAKE these medications  aspirin 81 MG tablet  Take 81 mg by mouth every other day.     cholecalciferol 1000 UNITS tablet  Commonly known as:  VITAMIN D  Take 1,000 Units by mouth daily.     hydrochlorothiazide 25 MG tablet  Commonly known as:  HYDRODIURIL  Take 0.5 tablets (12.5 mg total) by mouth daily.  Start taking on:  05/22/2014     losartan 100 MG tablet  Commonly known as:  COZAAR  Take 1 tablet (100 mg total) by mouth daily.  Start taking on:  05/22/2014     metFORMIN 750 MG 24 hr tablet  Commonly known as:  GLUCOPHAGE-XR  Take 1 tablet (750 mg total) by mouth daily with supper.  Start taking on:  05/21/2014     metoprolol tartrate 25 MG tablet  Commonly known as:  LOPRESSOR  Take 0.5 tablets (12.5 mg total) by mouth 2 (two) times daily.     omeprazole 40 MG capsule  Commonly known as:  PRILOSEC  Take 1 capsule (40 mg total) by mouth every other day.     oseltamivir 30 MG capsule  Commonly known as:  TAMIFLU  Take 1 capsule (30 mg total) by mouth at bedtime.     pyridOXINE 100 MG tablet  Commonly known as:  VITAMIN B-6  Take 100 mg by mouth daily.     tamsulosin 0.4 MG Caps capsule  Commonly known as:  FLOMAX  Take 0.4 mg by mouth daily. 30 min after the same meal, daily     vitamin B-12 500 MCG tablet  Commonly known as:  CYANOCOBALAMIN  Take 500 mcg by mouth daily. lozenge     vitamin C 500 MG tablet  Commonly known as:  ASCORBIC ACID  Take 500 mg by mouth daily.         Total Time in preparing paper work, data evaluation and todays exam - 35 minutes  ELGERGAWY, DAWOOD M.D on 05/19/2014 at 11:07 AM  [facility] Office  [phone]

## 2014-05-19 NOTE — Discharge Instructions (Signed)
Follow with Primary MD Mathews Argyle, MD in 7 days   Get CBC, CMP, 2 view Chest X ray checked  by Primary MD next visit.    Activity: As tolerated with Full fall precautions use walker/cane & assistance as needed   Disposition Home    Diet: Heart Healthy , carbohydrate modified , with feeding assistance and aspiration precautions as needed.  For Heart failure patients - Check your Weight same time everyday, if you gain over 2 pounds, or you develop in leg swelling, experience more shortness of breath or chest pain, call your Primary MD immediately. Follow Cardiac Low Salt Diet and 1.5 lit/day fluid restriction.   On your next visit with your primary care physician please Get Medicines reviewed and adjusted.   Please request your Prim.MD to go over all Hospital Tests and Procedure/Radiological results at the follow up, please get all Hospital records sent to your Prim MD by signing hospital release before you go home.   If you experience worsening of your admission symptoms, develop shortness of breath, life threatening emergency, suicidal or homicidal thoughts you must seek medical attention immediately by calling 911 or calling your MD immediately  if symptoms less severe.  You Must read complete instructions/literature along with all the possible adverse reactions/side effects for all the Medicines you take and that have been prescribed to you. Take any new Medicines after you have completely understood and accpet all the possible adverse reactions/side effects.   Do not drive, operating heavy machinery, perform activities at heights, swimming or participation in water activities or provide baby sitting services if your were admitted for syncope or siezures until you have seen by Primary MD or a Neurologist and advised to do so again.  Do not drive when taking Pain medications.    Do not take more than prescribed Pain, Sleep and Anxiety Medications  Special Instructions: If  you have smoked or chewed Tobacco  in the last 2 yrs please stop smoking, stop any regular Alcohol  and or any Recreational drug use.  Wear Seat belts while driving.   Please note  You were cared for by a hospitalist during your hospital stay. If you have any questions about your discharge medications or the care you received while you were in the hospital after you are discharged, you can call the unit and asked to speak with the hospitalist on call if the hospitalist that took care of you is not available. Once you are discharged, your primary care physician will handle any further medical issues. Please note that NO REFILLS for any discharge medications will be authorized once you are discharged, as it is imperative that you return to your primary care physician (or establish a relationship with a primary care physician if you do not have one) for your aftercare needs so that they can reassess your need for medications and monitor your lab values.

## 2014-05-19 NOTE — Progress Notes (Signed)
Patient heart rate sustaining in the 50's while awake, K.Kirby notified for parameters for qhs Metoprolol. Received parameters to hold dose if rate <60. Tonight's dose of Metoprolol held, will continue to monitor. Tresa Endo

## 2014-05-19 NOTE — Progress Notes (Signed)
  Echocardiogram 2D Echocardiogram has been performed.  Paul Burnett M 05/19/2014, 12:35 PM

## 2014-05-19 NOTE — Progress Notes (Signed)
MD requesting patient to be sent home with Incentive spirometry and a flutter valve. Respiratory called and to bring flutter valve to patient's room. Incentive spirometer given to patient and patient educated on how to use. Patient able to demonstrate proper usage of incentive spirometer. Incentive spirometer completed x10.

## 2014-05-19 NOTE — Progress Notes (Signed)
Cardiac monitor discontinued since patient discharging. CCMD notified.

## 2014-05-22 ENCOUNTER — Telehealth: Payer: Self-pay

## 2014-05-22 NOTE — Telephone Encounter (Signed)
pt aware appt with Dr.Smith has been moved up to 3/28 @10 :45am. he verbalized understanding.

## 2014-05-23 LAB — CULTURE, BLOOD (ROUTINE X 2)
CULTURE: NO GROWTH
Culture: NO GROWTH

## 2014-06-05 ENCOUNTER — Encounter: Payer: Self-pay | Admitting: *Deleted

## 2014-06-05 ENCOUNTER — Ambulatory Visit (INDEPENDENT_AMBULATORY_CARE_PROVIDER_SITE_OTHER): Payer: Medicare Other | Admitting: Interventional Cardiology

## 2014-06-05 ENCOUNTER — Encounter: Payer: Self-pay | Admitting: Interventional Cardiology

## 2014-06-05 VITALS — BP 140/89 | HR 78 | Ht 67.0 in | Wt 136.0 lb

## 2014-06-05 DIAGNOSIS — I10 Essential (primary) hypertension: Secondary | ICD-10-CM | POA: Diagnosis not present

## 2014-06-05 DIAGNOSIS — I25118 Atherosclerotic heart disease of native coronary artery with other forms of angina pectoris: Secondary | ICD-10-CM

## 2014-06-05 DIAGNOSIS — N183 Chronic kidney disease, stage 3 unspecified: Secondary | ICD-10-CM

## 2014-06-05 DIAGNOSIS — I471 Supraventricular tachycardia: Secondary | ICD-10-CM

## 2014-06-05 DIAGNOSIS — I495 Sick sinus syndrome: Secondary | ICD-10-CM | POA: Diagnosis not present

## 2014-06-05 MED ORDER — METOPROLOL TARTRATE 25 MG PO TABS: 12.5000 mg | ORAL_TABLET | Freq: Two times a day (BID) | ORAL | Status: DC

## 2014-06-05 MED ORDER — LOSARTAN POTASSIUM 100 MG PO TABS: 50.0000 mg | ORAL_TABLET | Freq: Every day | ORAL | Status: DC

## 2014-06-05 NOTE — Patient Instructions (Signed)
Your physician has recommended you make the following change in your medication:  1) RESUME Losartan - take 50 mg daily  We will fax the [facility] information that Atenolol was stopped secondary to bradycardia and kidney impairment.  Your physician recommends that you schedule a follow-up appointment in: 3-4 months with Dr. Tamala Julian.

## 2014-06-05 NOTE — Progress Notes (Signed)
Cardiology Office Note   Date:  06/05/2014   ID:  Paul Burnett, DOB [birth year], MRN [id number]  PCP:  Mathews Argyle, MD  Cardiologist:   Sinclair Grooms, MD   Chief Complaint  Patient presents with  . hospital f/u      History of Present Illness: Paul Burnett is a 81 y.o. male who presents for increased heart rate with physical activity. He is on metoprolol instead of atenolol and the dose was increased recently to 25 mg twice a day. He is still having the exertion related tachycardia. Also noted during a recent hospital stay to have significant bradycardia. He denies syncope, dizziness, and other complaints. He is over the recent upper respiratory infection.    Past Medical History  Diagnosis Date  . Spinal stenosis   . Peripheral neuropathy   . Degenerative joint disease   . pulmonary hypertension   . Hypertension   . Pulmonary hypertension   . Monoclonal gammopathy   . Coronary artery disease   . Anemia   . GERD (gastroesophageal reflux disease)   . Hiatal hernia   . DYSPNEA ON EXERTION 07/19/2008  . MGUS (monoclonal gammopathy of unknown significance) 01/31/2014    IgA kappa dx 07/22/10   . CKD (chronic kidney disease), stage III 05/17/2014  . SVT (supraventricular tachycardia) hospitalized 05/17/2014  . Prostate cancer   . Type II diabetes mellitus     Past Surgical History  Procedure Laterality Date  . Cholecystectomy    . Colon surgery    . Laminectomy  x 4 last sx 1960's  . Coronary angioplasty    . Total hip arthroplasty Right 11/18/2010  . Joint replacement       Current Outpatient Prescriptions  Medication Sig Dispense Refill  . aspirin 81 MG tablet Take 81 mg by mouth every other day.     . cholecalciferol (VITAMIN D) 1000 UNITS tablet Take 1,000 Units by mouth daily.    Marland Kitchen glucose blood test strip Use as instructed 100 each 0  . glucose monitoring kit (FREESTYLE) monitoring kit 1 each by Does not apply route as needed for other.  GLUCOMETER 1 each 0  . hydrochlorothiazide (HYDRODIURIL) 25 MG tablet Take 0.5 tablets (12.5 mg total) by mouth daily.    Marland Kitchen losartan (COZAAR) 100 MG tablet Take 1 tablet (100 mg total) by mouth daily.    . metoprolol tartrate (LOPRESSOR) 25 MG tablet Take 0.5 tablets (12.5 mg total) by mouth 2 (two) times daily. 30 tablet 0  . omeprazole (PRILOSEC) 40 MG capsule Take 1 capsule (40 mg total) by mouth every other day.    . pyridOXINE (VITAMIN B-6) 100 MG tablet Take 100 mg by mouth daily.      . Tamsulosin HCl (FLOMAX) 0.4 MG CAPS Take 0.4 mg by mouth daily. 30 min after the same meal, daily     . vitamin C (ASCORBIC ACID) 500 MG tablet Take 500 mg by mouth daily.       No current facility-administered medications for this visit.    Allergies:   Insulins and Meperidine hcl    Social History:  The patient  reports that he quit smoking about 40 years ago. His smoking use included Cigarettes. He does not have any smokeless tobacco history on file. He reports that he does not drink alcohol or use illicit drugs.   Family History:  The patient's family history includes Cancer in his father; Heart attack in his father and mother. There is  no history of Stroke.    ROS:  Please see the history of present illness. l. Otherwise, review of systems are positive for .  He was to be certain that we refill metoprolol at both [facility] and the [facility]. He was switched from atenolol to metoprolo   All other systems are reviewed and negative.    PHYSICAL EXAM: VS:  BP 140/89 mmHg  Pulse 78  Ht _0  (1.702 m)  Wt 136 lb (61.689 kg)  BMI 21.30 kg/m2  SpO2 99% , BMI Body mass index is 21.3 kg/(m^2). GEN: Well nourished, well developed, in no acute distress HEENT: normal Neck: no JVD, carotid bruits, or masses Cardiac: Bradycardic RRR; no murmurs, rubs, or gallops,no edema  Respiratory:  clear to auscultation bilaterally, normal work of breathing GI: soft, nontender, nondistended, + BS MS: no  deformity or atrophy Skin: warm and dry, no rash Neuro:  Strength and sensation are intact Psych: euthymic mood, full affect   EKG:  EKG is not ordered today.   Recent Labs: 05/17/2014: ALT 14; TSH 0.918 05/18/2014: Magnesium 1.7 05/19/2014: BUN 26*; Creatinine 1.71*; Hemoglobin 8.9*; Platelets 104*; Potassium 4.0; Sodium 140    Lipid Panel No results found for: CHOL, TRIG, HDL, CHOLHDL, VLDL, LDLCALC, LDLDIRECT    Wt Readings from Last 3 Encounters:  06/05/14 136 lb (61.689 kg)  05/19/14 133 lb 4.8 oz (60.464 kg)  08/09/13 142 lb (64.411 kg)      Other studies Reviewed: Additional studies/ records that were reviewed today include: .   ASSESSMENT AND PLAN:  Tachycardia-bradycardia syndrome: Heart rate is normal today.  SVT (supraventricular tachycardia): Notices some increase in heart rate with activity but nothing above 100 bpm.  Atherosclerosis of coronary artery with other form of angina pectoris  Essential hypertension  CKD (chronic kidney disease), stage III     Current medicines are reviewed at length with the patient today.  The patient has concerns regarding medicines.  The following changes have been made:  We will send in a prescription to the [facility] for metoprolol tartrate 25 mg twice a day, 3 months refill. One must supply will also be requested from [facility]. We will resume losartan 50 mg per day and in several weeks of the blood pressure allows go back up to the usual 100 mg per day. Would not increase the dose to 751 mg of systolic pressures are less than 110 mmHg.  Labs/ tests ordered today include:  No orders of the defined types were placed in this encounter.     Disposition:   FU with Linard Millers in 3-4 months or earlier if symptoms  Signed, Sinclair Grooms, MD  06/05/2014 10:53 AM    [address], [place], [place]  [address] Phone: [phone]; Fax: [phone]

## 2014-06-06 ENCOUNTER — Encounter: Payer: Medicare Other | Attending: Geriatric Medicine | Admitting: *Deleted

## 2014-06-06 ENCOUNTER — Other Ambulatory Visit: Payer: Self-pay | Admitting: *Deleted

## 2014-06-06 ENCOUNTER — Telehealth: Payer: Self-pay | Admitting: Interventional Cardiology

## 2014-06-06 ENCOUNTER — Encounter: Payer: Self-pay | Admitting: *Deleted

## 2014-06-06 DIAGNOSIS — Z713 Dietary counseling and surveillance: Secondary | ICD-10-CM | POA: Insufficient documentation

## 2014-06-06 DIAGNOSIS — E119 Type 2 diabetes mellitus without complications: Secondary | ICD-10-CM | POA: Diagnosis present

## 2014-06-06 MED ORDER — METOPROLOL TARTRATE 25 MG PO TABS: 25.0000 mg | ORAL_TABLET | Freq: Two times a day (BID) | ORAL | Status: DC

## 2014-06-06 NOTE — Telephone Encounter (Signed)
Please do the prescriptions for Dr. [name] again to reflect that metoprolol tartrate 25 mg BID. Send one moth supply to [facility] and 3 months to the [facility]. Thanks.

## 2014-06-06 NOTE — Telephone Encounter (Signed)
Dr. Tamala Julian called me this morning to correct this from yesterday's office visit. New rx sent  Faxed new 3 month supply rx to requested [facility].

## 2014-06-06 NOTE — Progress Notes (Signed)
Blood Glucose Monitoring Instruction  Appointment Start Time: 1100 Appointment End Time: 1130  Assessment:  Primary concerns today: Patient here for instruction on Blood Glucose Monitoring. Patient brings new ADVOCATE meter and supplies with him. He received this through mail order approved by Medicare. His A1c is 6.1% and he had discontinued testing. It has now been requested that he return to testing. 3hpp reading of 150mg /dl at time of instruction.     Intervention:    Explained rationale of testing BG to obtain data as to how their diabetes is being managed.  Taught patient techniques for using BG monitor and lancing device  Dispensed log book for record keeping  Explained rationale of recording BG both for patient and MD to assess patterns as needed.

## 2014-06-27 ENCOUNTER — Encounter: Payer: PRIVATE HEALTH INSURANCE | Admitting: Interventional Cardiology

## 2014-07-10 ENCOUNTER — Telehealth: Payer: Self-pay | Admitting: Interventional Cardiology

## 2014-07-10 NOTE — Telephone Encounter (Signed)
Returned pt call. Pt has been having increased palpitations and is requesting to be seen. Per Dr.Smith ok to add on appt on 5/6 @ 8:30 Pt appreciative for the assistance and verbalized understanding.

## 2014-07-10 NOTE — Telephone Encounter (Signed)
New problem    Pt want an appt in early June. Please advise Dr [name].

## 2014-07-14 ENCOUNTER — Encounter: Payer: Self-pay | Admitting: Interventional Cardiology

## 2014-07-14 ENCOUNTER — Ambulatory Visit (INDEPENDENT_AMBULATORY_CARE_PROVIDER_SITE_OTHER): Payer: Medicare Other

## 2014-07-14 ENCOUNTER — Ambulatory Visit (INDEPENDENT_AMBULATORY_CARE_PROVIDER_SITE_OTHER): Payer: Medicare Other | Admitting: Interventional Cardiology

## 2014-07-14 VITALS — BP 146/74 | HR 74 | Ht 67.0 in | Wt 138.1 lb

## 2014-07-14 DIAGNOSIS — J438 Other emphysema: Secondary | ICD-10-CM

## 2014-07-14 DIAGNOSIS — I471 Supraventricular tachycardia: Secondary | ICD-10-CM | POA: Diagnosis not present

## 2014-07-14 DIAGNOSIS — I25118 Atherosclerotic heart disease of native coronary artery with other forms of angina pectoris: Secondary | ICD-10-CM | POA: Diagnosis not present

## 2014-07-14 DIAGNOSIS — I10 Essential (primary) hypertension: Secondary | ICD-10-CM | POA: Diagnosis not present

## 2014-07-14 MED ORDER — METOPROLOL TARTRATE 25 MG PO TABS: 37.5000 mg | ORAL_TABLET | Freq: Two times a day (BID) | ORAL | Status: DC

## 2014-07-14 NOTE — Patient Instructions (Signed)
Medication Instructions:  Your physician has recommended you make the following change in your medication:  1) INCREASE Metoprolol to 37.5mg  Twice Daily  Labwork: None   Testing/Procedures: Your physician has recommended that you wear a holter monitor. Holter monitors are medical devices that record the heart's electrical activity. Doctors most often use these monitors to diagnose arrhythmias. Arrhythmias are problems with the speed or rhythm of the heartbeat. The monitor is a small, portable device. You can wear one while you do your normal daily activities. This is usually used to diagnose what is causing palpitations/syncope (passing out).   Follow-Up: Your physician recommends that you schedule a follow-up appointment pending holter results   Any Other Special Instructions Will Be Listed Below (If Applicable).

## 2014-07-14 NOTE — Progress Notes (Signed)
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 Also CK D.  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 His complaint is that after he is up and walking around his heart beats too fast and his pulse becomes thready. We replicated this today in the office. His heart rate increases slightly but his blood pressure remained stable. Blood pressure sitting was 140/70 mmHg, with standing it dropped to 120/68 mmHg. After walking the heart rate did not increase appreciably and the blood pressure was 138/80. He states that this was not a complete replication of his symptoms because her heart rate did not increase with doesn't home. He has not had angina. No syncope.    Past Medical History  Diagnosis Date  . Spinal stenosis   . Peripheral neuropathy   . Degenerative joint disease   . pulmonary hypertension   . Hypertension   . Pulmonary hypertension   . Monoclonal gammopathy   . Coronary artery disease   . Anemia   . GERD (gastroesophageal reflux disease)   . Hiatal hernia   . DYSPNEA ON EXERTION 07/19/2008  . MGUS (monoclonal gammopathy of unknown significance) 01/31/2014    IgA kappa dx 07/22/10   . CKD (chronic kidney disease), stage III 05/17/2014  . SVT (supraventricular tachycardia) hospitalized 05/17/2014  . Prostate cancer   . Type II diabetes mellitus     Past Surgical History  Procedure Laterality Date  . Cholecystectomy    . Colon surgery    . Laminectomy  x 4 last sx 1960's  . Coronary angioplasty    . Total hip arthroplasty Right 11/18/2010  . Joint replacement        Current Outpatient Prescriptions  Medication Sig Dispense Refill  . aspirin 81 MG tablet Take 81 mg by mouth every other day.     . cholecalciferol (VITAMIN D) 1000 UNITS tablet Take 1,000 Units by mouth daily.    Marland Kitchen losartan (COZAAR) 100 MG tablet Take 100 mg by mouth daily as needed (for blood pressure).    . metoprolol tartrate (LOPRESSOR) 25 MG tablet Take 1.5 tablets (37.5 mg total) by mouth 2 (two) times daily.  0  . omeprazole (PRILOSEC) 40 MG capsule Take 1 capsule (40 mg total) by mouth every other day.    . pyridOXINE (VITAMIN B-6) 100 MG tablet Take 100 mg by mouth daily.      . Tamsulosin HCl (FLOMAX) 0.4 MG CAPS Take 0.4 mg by mouth daily. 30 min after the same meal, daily     . vitamin C (ASCORBIC ACID) 500 MG tablet Take 500 mg by mouth daily.       No current facility-administered medications for this visit.    Allergies:   Insulins and Meperidine hcl    Social History:  The patient  reports that he quit smoking about 40 years ago. His smoking use included Cigarettes. He does not have any smokeless tobacco history on file. He reports that he does not drink alcohol or use illicit drugs.  Family History:  The patient's family history includes Cancer in his father; Heart attack in his father and mother. There is no history of Stroke.    ROS:  Please see the history of present illness.   Otherwise, review of systems are positive for weakness..   All other systems are reviewed and negative.    PHYSICAL EXAM: VS:  BP 146/74 mmHg  Pulse 74  Ht 5\' 7"  (1.702 m)  Wt 138 lb 1.9 oz (62.651 kg)  BMI 21.63 kg/m2 , BMI Body mass index is 21.63 kg/(m^2). GEN: Well nourished, well developed, in no acute distress HEENT: normal Neck: no JVD, carotid bruits, or masses Cardiac: A 2/6 decrescendo murmur of aortic regurgitation. RRR;  rubs, or gallops,no edema  Respiratory:  clear to auscultation bilaterally, normal work of breathing GI: soft, nontender, nondistended, + BS MS:  no deformity or atrophy Skin: warm and dry, no rash Neuro:  Strength and sensation are intact Psych: euthymic mood, full affect   EKG:  EKG is ordered today. The ekg ordered today demonstrates sinus rhythm at 74 bpm borderline first degree AV block, right bundle-branch block, and left anterior hemiblock.   Recent Labs: 05/17/2014: ALT 14; TSH 0.918 05/18/2014: Magnesium 1.7 05/19/2014: BUN 26*; Creatinine 1.71*; Hemoglobin 8.9*; Platelets 104*; Potassium 4.0; Sodium 140    Lipid Panel No results found for: CHOL, TRIG, HDL, CHOLHDL, VLDL, LDLCALC, LDLDIRECT    Wt Readings from Last 3 Encounters:  07/14/14 138 lb 1.9 oz (62.651 kg)  06/05/14 136 lb (61.689 kg)  05/19/14 133 lb 4.8 oz (60.464 kg)      Other studies Reviewed: Additional studies/ records that were reviewed today include: .   ASSESSMENT AND PLAN:  SVT (supraventricular tachycardia) - we will do a 24-hour Holter to determine if what he feels is related to an abnormal rhythm. In the meantime we will increase metoprolol to 37.5 mg by mouth twice a day.  Essential hypertension - adequate control  Other emphysema: Causing dyspnea     Current medicines are reviewed at length with the patient today.  The patient has concerns regarding medicines.  The following changes have been made:  Concern about increased heart rate. Feels we need to make an adjustment in his medications. We have increased metoprolol to 37.5 mg by mouth twice a day. We will do a Holter monitor after medication increase to make sure there is no excessive bradycardia.  Labs/ tests ordered today include:   Orders Placed This Encounter  Procedures  . Holter monitor - 24 hour  . EKG 12-Lead     Disposition:   FU with HS in as needed based upon findings from the Holter   Signed, Sinclair Grooms, MD  07/14/2014 9:18 AM    [address] [place], [place], [place]  [address] Phone: [phone]; Fax: [phone]

## 2014-07-17 ENCOUNTER — Ambulatory Visit (INDEPENDENT_AMBULATORY_CARE_PROVIDER_SITE_OTHER): Payer: Medicare Other | Admitting: Podiatry

## 2014-07-17 ENCOUNTER — Ambulatory Visit: Payer: Medicare Other

## 2014-07-17 DIAGNOSIS — M79673 Pain in unspecified foot: Secondary | ICD-10-CM

## 2014-07-17 DIAGNOSIS — B351 Tinea unguium: Secondary | ICD-10-CM

## 2014-07-17 NOTE — Progress Notes (Signed)
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   MRN: [id number]  Subjective: 81 y.o.-year-old male returns the office today for painful, elongated, thickened toenails which he is unable to trim himself. Denies any redness or drainage around the nails. Denies any acute changes since last appointment and no new complaints today. Denies any systemic complaints such as fevers, chills, nausea, vomiting.   Objective: AAO 3, NAD DP/PT pulses palpable, CRT less than 3 seconds Nails hypertrophic, dystrophic, elongated, brittle, discolored 10. There is tenderness overlying the nails 1-5 bilaterally. There is no surrounding erythema or drainage along the nail sites. No open lesions or pre-ulcerative lesions are identified. No other areas of tenderness bilateral lower extremities. No overlying edema, erythema, increased warmth. No pain with calf compression, swelling, warmth, erythema.  Assessment: Patient presents with symptomatic onychomycosis  Plan: -Treatment options including alternatives, risks, complications were discussed -Nails sharply debrided 10 without complication/bleeding. -Discussed daily foot inspection. If there are any changes, to call the office immediately.  -Follow-up in 3 months or sooner if any problems are to arise. In the meantime, encouraged to call the office with any questions, concerns, changes symptoms.

## 2014-07-24 ENCOUNTER — Telehealth: Payer: Self-pay

## 2014-07-24 NOTE — Telephone Encounter (Signed)
Pt aware of holter monitor results. no significant bradycardia and that SVT is brief<14 beats per event. No change in therapy for now. Pt verbalized understanding. Pt sts that he has additional questions and has emailed Dr.Smith directly.

## 2014-07-24 NOTE — Telephone Encounter (Signed)
The PR interval is unchanged. I answered his email. Needs follow-up in 4-6 months unless persistent tachycardia or other complaints

## 2014-07-25 NOTE — Telephone Encounter (Signed)
Recall in Epic  

## 2014-07-27 NOTE — Telephone Encounter (Signed)
Per pt calling 2 mo f/u scheduled with Dr.Smith for 7 @ 9:45am. Pt aware of appt and appreciative of the call back

## 2014-07-27 NOTE — Telephone Encounter (Signed)
New Message       Pt calling stating that he spoke to Dr. Tamala Julian yesterday and was told that Dr. Tamala Julian wanted to see him back in July. I informed the pt that the recall in the system showed that he wasn't due for another visit until September, pt became irate and began raising his voice and yelling at me to just schedule him an appointment in July like he just told me to. 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 Informed pt that Dr. Tamala Julian doesn't have any openings in July and would send a message to Lattie Haw in regards to getting pt worked in if he is supposed to see Dr. Tamala Julian in July. Please call pt back and advise.

## 2014-08-31 ENCOUNTER — Other Ambulatory Visit: Payer: Self-pay | Admitting: Interventional Cardiology

## 2014-09-01 ENCOUNTER — Other Ambulatory Visit: Payer: Self-pay

## 2014-09-01 DIAGNOSIS — I10 Essential (primary) hypertension: Secondary | ICD-10-CM

## 2014-09-01 MED ORDER — METOPROLOL TARTRATE 25 MG PO TABS: 37.5000 mg | ORAL_TABLET | Freq: Two times a day (BID) | ORAL | Status: DC

## 2014-09-19 ENCOUNTER — Ambulatory Visit (INDEPENDENT_AMBULATORY_CARE_PROVIDER_SITE_OTHER): Payer: Medicare Other | Admitting: Interventional Cardiology

## 2014-09-19 ENCOUNTER — Encounter: Payer: Self-pay | Admitting: Interventional Cardiology

## 2014-09-19 VITALS — BP 124/72 | HR 71 | Ht 67.0 in | Wt 139.0 lb

## 2014-09-19 DIAGNOSIS — I25118 Atherosclerotic heart disease of native coronary artery with other forms of angina pectoris: Secondary | ICD-10-CM | POA: Diagnosis not present

## 2014-09-19 DIAGNOSIS — J438 Other emphysema: Secondary | ICD-10-CM

## 2014-09-19 DIAGNOSIS — I471 Supraventricular tachycardia: Secondary | ICD-10-CM

## 2014-09-19 DIAGNOSIS — I10 Essential (primary) hypertension: Secondary | ICD-10-CM | POA: Diagnosis not present

## 2014-09-19 NOTE — Patient Instructions (Signed)
Medication Instructions:  Your physician recommends that you continue on your current medications as directed. Please refer to the Current Medication list given to you today.   Labwork: None   Testing/Procedures: None   Follow-Up: Your physician recommends that you schedule a follow-up appointment in: 3 months   Any Other Special Instructions Will Be Listed Below (If Applicable).

## 2014-09-19 NOTE — Progress Notes (Signed)
Cardiology Office Note   Date:  09/19/2014   ID:  Paul Baller, Paul Burnett, DOB 07-30-1924, MRN [id number]  PCP:  Mathews Argyle, Paul Burnett  Cardiologist:  Sinclair Grooms, Paul Burnett   No chief complaint on file.     History of Present Illness: Paul Baller, Paul Burnett is a 81 y.o. male who presents for PAT, hypertension, CAD, diabetes, and hyperlipidemia. Also first AV block on EKG.  Since a recent adjustment in metoprolol dose, no palpitations have occurred. No episodes of syncope, chest pain, orthopnea, or PND.    Past Medical History  Diagnosis Date  . Spinal stenosis   . Peripheral neuropathy   . Degenerative joint disease   . pulmonary hypertension   . Hypertension   . Pulmonary hypertension   . Monoclonal gammopathy   . Coronary artery disease   . Anemia   . GERD (gastroesophageal reflux disease)   . Hiatal hernia   . DYSPNEA ON EXERTION 07/19/2008  . MGUS (monoclonal gammopathy of unknown significance) 01/31/2014    IgA kappa dx 07/22/10   . CKD (chronic kidney disease), stage III 05/17/2014  . SVT (supraventricular tachycardia) hospitalized 05/17/2014  . Prostate cancer   . Type II diabetes mellitus     Past Surgical History  Procedure Laterality Date  . Cholecystectomy    . Colon surgery    . Laminectomy  x 4 last sx 1960's  . Coronary angioplasty    . Total hip arthroplasty Right 11/18/2010  . Joint replacement       Current Outpatient Prescriptions  Medication Sig Dispense Refill  . aspirin 81 MG tablet Take 81 mg by mouth every other day.     . cholecalciferol (VITAMIN D) 1000 UNITS tablet Take 1,000 Units by mouth daily.    . metoprolol tartrate (LOPRESSOR) 25 MG tablet Take 1.5 tablets (37.5 mg total) by mouth 2 (two) times daily. 270 tablet 3  . omeprazole (PRILOSEC) 40 MG capsule Take 1 capsule (40 mg total) by mouth every other day.    . Tamsulosin HCl (FLOMAX) 0.4 MG CAPS Take 0.4 mg by mouth daily. 30 min after the same meal, daily     . vitamin C  (ASCORBIC ACID) 500 MG tablet Take 500 mg by mouth daily.       No current facility-administered medications for this visit.    Allergies:   Meperidine hcl and Insulins    Social History:  The patient  reports that he quit smoking about 40 years ago. His smoking use included Cigarettes. He has never used smokeless tobacco. He reports that he does not drink alcohol or use illicit drugs.   Family History:  The patient's family history includes Cancer in his father; Heart attack in his father and mother. There is no history of Stroke.    ROS:  Please see the history of present illness.   Otherwise, review of systems are positive for back discomfort, leg weakness, difficulty with ambulation..   All other systems are reviewed and negative.    PHYSICAL EXAM: VS:  BP 124/72 mmHg  Pulse 71  Ht 5\' 7"  (1.702 m)  Wt 63.05 kg (139 lb)  BMI 21.77 kg/m2 , BMI Body mass index is 21.77 kg/(m^2). GEN: Well nourished, well developed, in no acute distress HEENT: normal Neck: no JVD, carotid bruits, or masses Cardiac: RRR; no murmurs, rubs, or gallops,no edema  Respiratory:  clear to auscultation bilaterally, normal work of breathing GI: soft, nontender, nondistended, + BS MS: no deformity  or atrophy Skin: warm and dry, no rash Neuro:  Strength and sensation are intact Psych: euthymic mood, full affect   EKG:  EKG is ordered today. The ekg ordered today demonstrates first-degree AV block at 230 ms, left axis deviation, and right bundle branch block. Left ventricular hypertrophy is noted. Compared to the prior tracing the PR interval is 30 ms longer than previous.   Recent Labs: 05/17/2014: ALT 14; TSH 0.918 05/18/2014: Magnesium 1.7 05/19/2014: BUN 26*; Creatinine, Ser 1.71*; Hemoglobin 8.9*; Platelets 104*; Potassium 4.0; Sodium 140    Lipid Panel No results found for: CHOL, TRIG, HDL, CHOLHDL, VLDL, LDLCALC, LDLDIRECT    Wt Readings from Last 3 Encounters:  09/19/14 63.05 kg (139 lb)    07/14/14 62.651 kg (138 lb 1.9 oz)  06/05/14 61.689 kg (136 lb)      Other studies Reviewed: Additional studies/ records that were reviewed today include: . Review of the above records demonstrates:    ASSESSMENT AND PLAN:  1. SVT (supraventricular tachycardia) On the current dose of beta blocker therapy, no episodes have occurred. On today's EKG PR interval is 230 ms. Previous when necessary levels had been around 200 ms. We will decrease metoprolol to 25 mg twice a day. Report any significant palpitations, and if that occurs we will increase the dose back to 37.5 mg twice a day. - EKG 12-Lead  2. Essential hypertension Controlled - EKG 12-Lead  3. Atherosclerosis of native coronary artery of native heart with other form of angina pectoris Absent - EKG 12-Lead  4. Other emphysema     Current medicines are reviewed at length with the patient today.  The patient does not have concerns regarding medicines.  The following changes have been made:  Decrease metoprolol to 25 mg twice a day  Labs/ tests ordered today include:  Orders Placed This Encounter  Procedures  . EKG 12-Lead     Disposition:   FU with HS in 3 months  Signed, Sinclair Grooms, Paul Burnett  09/19/2014 9:50 AM    [facility] [place], [place], [place]  [address] Phone: [phone]; Fax: [phone]

## 2014-11-27 ENCOUNTER — Ambulatory Visit: Payer: Medicare Other | Admitting: Podiatry

## 2014-11-29 ENCOUNTER — Encounter: Payer: Self-pay | Admitting: Podiatry

## 2014-11-29 ENCOUNTER — Ambulatory Visit (INDEPENDENT_AMBULATORY_CARE_PROVIDER_SITE_OTHER): Payer: Medicare Other | Admitting: Podiatry

## 2014-11-29 VITALS — BP 131/70 | HR 72 | Resp 16

## 2014-11-29 DIAGNOSIS — M79673 Pain in unspecified foot: Secondary | ICD-10-CM

## 2014-11-29 DIAGNOSIS — B351 Tinea unguium: Secondary | ICD-10-CM

## 2014-11-29 DIAGNOSIS — E1140 Type 2 diabetes mellitus with diabetic neuropathy, unspecified: Secondary | ICD-10-CM

## 2014-11-29 NOTE — Progress Notes (Signed)
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   MRN: [id number]  HPI patient presents with long-term diabetes with elongated incurvated nailbeds 1-5 both feet that are painful   Review of Systems     Objective:   Physical Exam Neurovascular status unchanged with thick yellow brittle nailbeds 1-5 both feet that are painful    Assessment:     Mycotic nail infections with pain 1-5 both feet    Plan:     Debride painful nailbeds 1-5 both feet with no iatrogenic bleeding noted

## 2014-12-13 ENCOUNTER — Ambulatory Visit (INDEPENDENT_AMBULATORY_CARE_PROVIDER_SITE_OTHER): Payer: Medicare Other | Admitting: Interventional Cardiology

## 2014-12-13 ENCOUNTER — Encounter: Payer: Self-pay | Admitting: Interventional Cardiology

## 2014-12-13 VITALS — BP 150/84 | HR 78 | Ht 67.0 in | Wt 137.0 lb

## 2014-12-13 DIAGNOSIS — I25118 Atherosclerotic heart disease of native coronary artery with other forms of angina pectoris: Secondary | ICD-10-CM

## 2014-12-13 DIAGNOSIS — I495 Sick sinus syndrome: Secondary | ICD-10-CM | POA: Diagnosis not present

## 2014-12-13 DIAGNOSIS — I10 Essential (primary) hypertension: Secondary | ICD-10-CM | POA: Diagnosis not present

## 2014-12-13 DIAGNOSIS — I471 Supraventricular tachycardia: Secondary | ICD-10-CM

## 2014-12-13 DIAGNOSIS — N183 Chronic kidney disease, stage 3 unspecified: Secondary | ICD-10-CM

## 2014-12-13 NOTE — Patient Instructions (Addendum)
Medication Instructions:  Your physician recommends that you continue on your current medications as directed. Please refer to the Current Medication list given to you today.   Labwork: None ordered  Testing/Procedures: None ordered  Follow-Up: Your physician wants you to follow-up in: 6-9 months You will receive a reminder letter in the mail two months in advance. If you don't receive a letter, please call our office to schedule the follow-up appointment.   Any Other Special Instructions Will Be Listed Below (If Applicable).
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
## 2015-02-20 ENCOUNTER — Telehealth: Payer: Self-pay

## 2015-02-20 NOTE — Telephone Encounter (Signed)
Pt aware of appt with Dr.Smith for 12/16 @8 :30am for an add on with an EKG

## 2015-02-23 ENCOUNTER — Ambulatory Visit (INDEPENDENT_AMBULATORY_CARE_PROVIDER_SITE_OTHER): Payer: Medicare Other | Admitting: Interventional Cardiology

## 2015-02-23 ENCOUNTER — Encounter: Payer: Self-pay | Admitting: Interventional Cardiology

## 2015-02-23 VITALS — BP 140/82 | HR 75 | Ht 67.0 in | Wt 143.8 lb

## 2015-02-23 DIAGNOSIS — I10 Essential (primary) hypertension: Secondary | ICD-10-CM

## 2015-02-23 DIAGNOSIS — I495 Sick sinus syndrome: Secondary | ICD-10-CM

## 2015-02-23 DIAGNOSIS — N183 Chronic kidney disease, stage 3 unspecified: Secondary | ICD-10-CM

## 2015-02-23 DIAGNOSIS — I25118 Atherosclerotic heart disease of native coronary artery with other forms of angina pectoris: Secondary | ICD-10-CM

## 2015-02-23 DIAGNOSIS — I471 Supraventricular tachycardia: Secondary | ICD-10-CM | POA: Diagnosis not present

## 2015-02-23 NOTE — Progress Notes (Signed)
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  Dr. [name] notified me earlier this week that exertion produces rapid heart rates. We decided to increase metoprolol to 37.5 mg each morning and continue 25 mg of metoprolol tartrate each evening. This is significantly improved the complaint of tachycardia. No significant episodes of recurrent since change. He is in today to be reevaluated to make sure blood pressure, heart rate, and PR interval or not adversely affected by the dose increase.    Past Medical History  Diagnosis Date  . Spinal stenosis   . Peripheral neuropathy ([place])   . Degenerative joint disease   . pulmonary hypertension   . Hypertension   . Pulmonary hypertension ([place])   . Monoclonal gammopathy   . Coronary artery disease   . Anemia   . GERD (gastroesophageal reflux disease)   . Hiatal hernia   . DYSPNEA ON EXERTION 07/19/2008  . MGUS (monoclonal gammopathy of unknown significance) 01/31/2014    IgA kappa dx 07/22/10   . CKD (chronic kidney disease), stage III 05/17/2014  . SVT (supraventricular tachycardia) ([place]) hospitalized 05/17/2014  . Prostate cancer ([place])   . Type II diabetes mellitus [facility])     Past Surgical History  Procedure Laterality Date  . Cholecystectomy    . Colon surgery    . Laminectomy  x 4 last sx 1960's  . Coronary angioplasty    . Total hip arthroplasty Right 11/18/2010  . Joint replacement       Current Outpatient Prescriptions  Medication Sig Dispense Refill  . aspirin 81 MG tablet Take 81 mg by mouth every other day.     .  cholecalciferol (VITAMIN D) 1000 UNITS tablet Take 1,000 Units by mouth daily.    . metoprolol tartrate (LOPRESSOR) 25 MG tablet Take one and a half (1.5) tablet (37.5 mg total) by mouth every morning. Take one (1) tablet (25 mg total) by mouth every evening.    Marland Kitchen omeprazole (PRILOSEC) 40 MG capsule Take 1 capsule (40 mg total) by mouth every other day.    . Tamsulosin HCl (FLOMAX) 0.4 MG CAPS Take 0.4 mg by mouth daily. 30 min after the same meal, daily     . vitamin C (ASCORBIC ACID) 500 MG tablet Take 500 mg by mouth daily.       No current facility-administered medications for this visit.    Allergies:   Meperidine hcl and Insulins    Social History:  The patient  reports that he quit smoking about 41 years ago. His smoking use included Cigarettes. He has never used smokeless tobacco. He reports that he does not drink alcohol or use illicit drugs.   Family History:  The patient's family history includes Cancer in his father; Heart attack in his father and mother. There is no history of Stroke.    ROS:  Please see the history of present illness.   Otherwise, review of systems are positive for none.   All other systems are reviewed and negative.    PHYSICAL EXAM: VS:  BP 140/82 mmHg  Pulse 75  Ht 5\' 7"  (1.702 m)  Wt 143 lb 12.8 oz (65.227 kg)  BMI 22.52 kg/m2 , BMI Body mass index is 22.52 kg/(m^2). GEN: Well nourished, well developed, in no acute distress HEENT: normal Neck: no JVD, carotid bruits, or masses Cardiac: RRR.  There is no murmur, rub, or gallop. There is no edema. Respiratory:  clear to auscultation bilaterally, normal work of breathing. GI: soft, nontender, nondistended, + BS MS: no deformity or atrophy Skin: warm and dry, no rash Neuro:  Strength and sensation are intact Psych: euthymic mood, full affect   EKG:  EKG is ordered today. The ekg reveals sinus rhythm, right bundle branch block, left axis deviation, prominent voltage, and normal PR  interval.   Recent Labs: 05/17/2014: ALT 14; TSH 0.918 05/18/2014: Magnesium 1.7 05/19/2014: BUN 26*; Creatinine, Ser 1.71*; Hemoglobin 8.9*; Platelets 104*; Potassium 4.0; Sodium 140    Lipid Panel No results found for: CHOL, TRIG, HDL, CHOLHDL, VLDL, LDLCALC, LDLDIRECT    Wt Readings from Last 3 Encounters:  02/23/15 143 lb 12.8 oz (65.227 kg)  12/13/14 137 lb (62.143 kg)  09/19/14 139 lb (63.05 kg)      Other studies Reviewed: Additional studies/ records that were reviewed today include: None. The findings include none.    ASSESSMENT AND PLAN:  1. SVT (supraventricular tachycardia) (HCC) Suppressed with slight increase in beta blocker dose.If symptoms continue, we will need to repeat a continuous monitor to exclude atrial fibrillation   2. Tachycardia-bradycardia syndrome ([place]) No excessive bradycardia noted with recent medication adjustment   3. Essential hypertension Controlled - EKG 12-Lead  4. CKD (chronic kidney disease), stage III Not assessed   5. Atherosclerosis of native coronary artery of native heart with other form of angina pectoris ([place]) Asymptomatic    Current medicines are reviewed at length with the patient today.  The patient has the following concerns regarding medicines: Metoprolol therapy and production of excessive bradycardia or conduction abnormality.   The following changes/actions have been instituted:    Current metoprolol tartrate dose is 37.5 mg a.m. and 25 mg in the p.m. No deleterious effect on AV conduction.   Labs/ tests ordered today include:  Orders Placed This Encounter  Procedures  . EKG 12-Lead     Disposition:   FU with HS in 4 months  Signed, Sinclair Grooms, MD  02/23/2015 8:32 AM    [facility] [address], [place], [place]  [address] Phone: [phone]; Fax: [phone]

## 2015-02-23 NOTE — Patient Instructions (Signed)
Medication Instructions:  Your physician recommends that you continue on your current medications as directed. Please refer to the Current Medication list given to you today.   Labwork: None ordered  Testing/Procedures: None ordered  Follow-Up: Your physician wants you to follow-up in: [place].  You will receive a reminder letter in the mail two months in advance. If you don't receive a letter, please call our office to schedule the follow-up appointment.   Any Other Special Instructions Will Be Listed Below (If Applicable).     If you need a refill on your cardiac medications before your next appointment, please call your pharmacy.

## 2015-04-04 ENCOUNTER — Ambulatory Visit (INDEPENDENT_AMBULATORY_CARE_PROVIDER_SITE_OTHER): Payer: Medicare Other | Admitting: Podiatry

## 2015-04-04 DIAGNOSIS — B351 Tinea unguium: Secondary | ICD-10-CM | POA: Diagnosis not present

## 2015-04-04 DIAGNOSIS — M79673 Pain in unspecified foot: Secondary | ICD-10-CM | POA: Diagnosis not present

## 2015-04-04 DIAGNOSIS — E1140 Type 2 diabetes mellitus with diabetic neuropathy, unspecified: Secondary | ICD-10-CM | POA: Diagnosis not present

## 2015-04-04 NOTE — Progress Notes (Signed)
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   MRN: [id number]  HPI patient presents with long-term diabetes with elongated incurvated nailbeds 1-5 both feet that are painful   Review of Systems     Objective:   Physical Exam Neurovascular status unchanged with thick yellow brittle nailbeds 1-5 both feet that are painful    Assessment:     Mycotic nail infections with pain 1-5 both feet    Plan:     Debride painful nailbeds 1-5 both feet with no iatrogenic bleeding noted      

## 2015-06-15 ENCOUNTER — Ambulatory Visit (INDEPENDENT_AMBULATORY_CARE_PROVIDER_SITE_OTHER): Payer: Medicare Other | Admitting: Interventional Cardiology

## 2015-06-15 ENCOUNTER — Encounter: Payer: Self-pay | Admitting: Interventional Cardiology

## 2015-06-15 VITALS — BP 142/82 | HR 70 | Ht 67.0 in | Wt 140.4 lb

## 2015-06-15 DIAGNOSIS — I10 Essential (primary) hypertension: Secondary | ICD-10-CM

## 2015-06-15 DIAGNOSIS — I495 Sick sinus syndrome: Secondary | ICD-10-CM

## 2015-06-15 DIAGNOSIS — N183 Chronic kidney disease, stage 3 unspecified: Secondary | ICD-10-CM

## 2015-06-15 DIAGNOSIS — I25118 Atherosclerotic heart disease of native coronary artery with other forms of angina pectoris: Secondary | ICD-10-CM

## 2015-06-15 NOTE — Progress Notes (Signed)
Cardiology Office Note   Date:  06/15/2015   ID:  Paul Baller, Paul Burnett, DOB 06/04/1924, MRN [id number]  PCP:  Mathews Argyle, Paul Burnett  Cardiologist:  Sinclair Grooms, Paul Burnett   Chief Complaint  Patient presents with  . Coronary Artery Disease  . Bradycardia      History of Present Illness: Paul Baller, Paul Burnett is a 82 y.o. 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 Now lives at [facility]. He is quite happy with the environment. States his appetite is good and he is eating well but unable to gain weight. He has not had chest discomfort, dyspnea, prolonged palpitations, or dizziness/syncope. He is able to lie flat. There is no peripheral edema. He has no medication side effects or complaints.    Past Medical History  Diagnosis Date  . Spinal stenosis   . Peripheral neuropathy ([place])   . Degenerative joint disease   . pulmonary hypertension   . Hypertension   . Pulmonary hypertension ([place])   . Monoclonal gammopathy   . Coronary artery disease   . Anemia   . GERD (gastroesophageal reflux disease)   . Hiatal hernia   . DYSPNEA ON EXERTION 07/19/2008  . MGUS (monoclonal gammopathy of unknown significance) 01/31/2014    IgA kappa dx 07/22/10   . CKD (chronic kidney disease), stage III 05/17/2014  . SVT (supraventricular tachycardia) ([place]) hospitalized 05/17/2014  . Prostate cancer ([place])   . Type II diabetes mellitus [facility])     Past Surgical History  Procedure Laterality Date  . Cholecystectomy    . Colon surgery    . Laminectomy  x 4 last sx 1960's  . Coronary angioplasty    . Total hip arthroplasty Right 11/18/2010  . Joint replacement       Current Outpatient Prescriptions  Medication Sig Dispense Refill  . aspirin 81 MG tablet Take 81 mg by mouth every other day.     . cholecalciferol (VITAMIN D) 1000  UNITS tablet Take 1,000 Units by mouth daily.    . Cyanocobalamin (VITAMIN B-12 IJ) Inject 1 mL as directed every 30 (thirty) days.    . metoprolol tartrate (LOPRESSOR) 25 MG tablet Take one and a half (1.5) tablet (37.5 mg total) by mouth every morning. Take one (1) tablet (25 mg total) by mouth every evening.    Marland Kitchen omeprazole (PRILOSEC) 20 MG capsule Take 20 mg by mouth every other day.    . Tamsulosin HCl (FLOMAX) 0.4 MG CAPS Take 0.4 mg by mouth daily. 30 min after the same meal, daily     . vitamin C (ASCORBIC ACID) 500 MG tablet Take 500 mg by mouth daily.       No current facility-administered medications for this visit.    Allergies:   Meperidine hcl and Insulins    Social History:  The patient  reports that he quit smoking about 41 years ago. His smoking use included Cigarettes. He has never used smokeless tobacco. He reports that he does not drink alcohol or use illicit drugs.   Family History:  The patient's family history includes Cancer in his father; Heart attack in his father and mother. There is no history of Stroke.    ROS:  Please see the history of present illness.   Otherwise, review of systems are positive for Difficulty with balance, occasional fall, back pain, peripheral tingling  in feet and hands..   All other systems are reviewed and negative.    PHYSICAL EXAM: VS:  BP 142/82 mmHg  Pulse 70  Ht 5\' 7"  (1.702 m)  Wt 140 lb 6.4 oz (63.685 kg)  BMI 21.98 kg/m2 , BMI Body mass index is 21.98 kg/(m^2). GEN: Well nourished, well developed, in no acute distress HEENT: normal Neck: no JVD, carotid bruits, or masses Cardiac: RRR.  There is no murmur, rub, or gallop. There is no edema. Respiratory:  clear to auscultation bilaterally, normal work of breathing. GI: soft, nontender, nondistended, + BS MS: no deformity or atrophy Skin: warm and dry, no rash Neuro:  Strength and sensation are intact Psych: euthymic mood, full affect   EKG:  EKG is ordered today. The  ekg reveals normal sinus rhythm, mild first degree AV block with PR 212 ms, right bundle branch block, left anterior hemiblock. No change compared to prior.   Recent Labs: No results found for requested labs within last 365 days.    Lipid Panel No results found for: CHOL, TRIG, HDL, CHOLHDL, VLDL, LDLCALC, LDLDIRECT    Wt Readings from Last 3 Encounters:  06/15/15 140 lb 6.4 oz (63.685 kg)  02/23/15 143 lb 12.8 oz (65.227 kg)  12/13/14 137 lb (62.143 kg)      Other studies Reviewed: Additional studies/ records that were reviewed today include: None. The findings include none.    ASSESSMENT AND PLAN:  1. Tachycardia-bradycardia syndrome ([place]) Relatively asymptomatic. No tachycardia palpitations or syncope. Stable EKG is noted. - EKG 12-Lead  2. Atherosclerosis of native coronary artery of native heart with other form of angina pectoris ([place]) Denies chest discomfort or other ischemic complaints. - EKG 12-Lead  3. Essential hypertension Controlled - EKG 12-Lead  4. CKD (chronic kidney disease), stage III Not evaluated - EKG 12-Lead    Current medicines are reviewed at length with the patient today.  The patient has the following concerns regarding medicines: No change.  The following changes/actions have been instituted:    No change in therapy  Labs/ tests ordered today include:  Orders Placed This Encounter  Procedures  . EKG 12-Lead     Disposition:   FU with HS in 6 months  Signed, Sinclair Grooms, Paul Burnett  06/15/2015 10:17 AM    [facility] [place], [place], [place]  [address] Phone: [phone]; Fax: [phone]

## 2015-06-15 NOTE — Patient Instructions (Signed)
Medication Instructions:  Your physician recommends that you continue on your current medications as directed. Please refer to the Current Medication list given to you today.  Labwork: None   Testing/Procedures: None  Follow-Up: Your physician wants you to follow-up in: 9 months with Dr Tamala Julian. (January 2018). You will receive a reminder letter in the mail two months in advance. If you don't receive a letter, please call our office to schedule the follow-up appointment.    If you need a refill on your cardiac medications before your next appointment, please call your pharmacy.

## 2015-07-25 ENCOUNTER — Other Ambulatory Visit: Payer: Medicare Other

## 2015-08-01 ENCOUNTER — Encounter: Payer: Self-pay | Admitting: Podiatry

## 2015-08-01 ENCOUNTER — Ambulatory Visit (INDEPENDENT_AMBULATORY_CARE_PROVIDER_SITE_OTHER): Payer: Medicare Other | Admitting: Podiatry

## 2015-08-01 DIAGNOSIS — M79673 Pain in unspecified foot: Secondary | ICD-10-CM | POA: Diagnosis not present

## 2015-08-01 DIAGNOSIS — B351 Tinea unguium: Secondary | ICD-10-CM

## 2015-08-01 DIAGNOSIS — E1140 Type 2 diabetes mellitus with diabetic neuropathy, unspecified: Secondary | ICD-10-CM | POA: Diagnosis not present

## 2015-08-01 NOTE — Progress Notes (Signed)
Subjective:     Patient ID: Paul Baller, MD, male   DOB: 1924-03-14, 82 y.o.   MRN: [id number]  HPI patient presents with painful nail disease 1-5 both feet that are thick incurvated and sore   Review of Systems     Objective:   Physical Exam Neurovascular status intact with yellow brittle nailbeds 1-5 both feet that are painful when pressed    Assessment:     Mycotic nail infection with pain 1-5 both feet    Plan:     Debris painful nailbeds 1-5 both feet with no iatrogenic bleeding

## 2015-10-02 ENCOUNTER — Other Ambulatory Visit: Payer: Self-pay | Admitting: Oncology

## 2015-10-02 DIAGNOSIS — D61818 Other pancytopenia: Secondary | ICD-10-CM

## 2015-10-02 DIAGNOSIS — D472 Monoclonal gammopathy: Secondary | ICD-10-CM

## 2015-10-03 ENCOUNTER — Other Ambulatory Visit: Payer: Self-pay | Admitting: Oncology

## 2015-10-03 ENCOUNTER — Ambulatory Visit (INDEPENDENT_AMBULATORY_CARE_PROVIDER_SITE_OTHER): Payer: Medicare Other | Admitting: Oncology

## 2015-10-03 ENCOUNTER — Other Ambulatory Visit: Payer: Medicare Other

## 2015-10-03 ENCOUNTER — Encounter: Payer: Self-pay | Admitting: Oncology

## 2015-10-03 ENCOUNTER — Encounter (INDEPENDENT_AMBULATORY_CARE_PROVIDER_SITE_OTHER): Payer: Self-pay

## 2015-10-03 DIAGNOSIS — D7589 Other specified diseases of blood and blood-forming organs: Secondary | ICD-10-CM

## 2015-10-03 DIAGNOSIS — D472 Monoclonal gammopathy: Secondary | ICD-10-CM

## 2015-10-03 DIAGNOSIS — D61818 Other pancytopenia: Secondary | ICD-10-CM

## 2015-10-03 HISTORY — DX: Other pancytopenia: D61.818

## 2015-10-03 LAB — CBC WITH DIFFERENTIAL/PLATELET
Basophils Absolute: 0 10*3/uL (ref 0.0–0.1)
Basophils Relative: 0 %
Eosinophils Absolute: 0 10*3/uL (ref 0.0–0.7)
Eosinophils Relative: 1 %
HEMATOCRIT: 25 % — AB (ref 39.0–52.0)
HEMOGLOBIN: 8.2 g/dL — AB (ref 13.0–17.0)
LYMPHS ABS: 1.8 10*3/uL (ref 0.7–4.0)
LYMPHS PCT: 52 %
MCH: 34.7 pg — ABNORMAL HIGH (ref 26.0–34.0)
MCHC: 32.8 g/dL (ref 30.0–36.0)
MCV: 105.9 fL — AB (ref 78.0–100.0)
MONO ABS: 0 10*3/uL — AB (ref 0.1–1.0)
MONOS PCT: 1 %
NEUTROS ABS: 1.6 10*3/uL — AB (ref 1.7–7.7)
NEUTROS PCT: 46 %
Platelets: 97 10*3/uL — ABNORMAL LOW (ref 150–400)
RBC: 2.36 MIL/uL — ABNORMAL LOW (ref 4.22–5.81)
RDW: 16.5 % — AB (ref 11.5–15.5)
WBC: 3.4 10*3/uL — ABNORMAL LOW (ref 4.0–10.5)

## 2015-10-03 LAB — SAVE SMEAR

## 2015-10-03 NOTE — Patient Instructions (Addendum)
Report to [facility] on Monday, July 31 at 7:30 AM for bone marrow biopsy Return to see Dr Beryle Beams to discuss results: I will call you when results available

## 2015-10-03 NOTE — Progress Notes (Signed)
Hematology and Oncology Follow Up Visit  Paul GAILLARD, Paul Burnett [id number] 1924/07/16 82 y.o. 10/03/2015 1:46 PM   Principle Diagnosis: Encounter Diagnoses  Name Primary?  Marland Kitchen MGUS (monoclonal gammopathy of unknown significance)   . Other pancytopenia [facility])   Clinical summary: There are no prior notes on this patient in Epic.  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 Bone marrow aspiration and biopsy done 09/23/2007 showed only 1% plasma cells. Last recorded data is from 01/24/2014. Kappa Serum Free light chains were elevated at 80.99 mg percent with a free light chain ratio elevated at 4.29. He has had a chronic, stable, normochromic anemia with hemoglobin at 10 g. He has chronic renal insufficiency which is the likely explanation up until now. Creatinine in May 2015 was 1.5 and over the last 2 years has slowly increased to most recent value of 1.7 on 10/01/2015. He was in for a routine follow-up visit with his primary care physician. Compared with lab done on 04/24/2015 there were a number of significant changes. Hemoglobin at that time was 10.1.  Hemoglobin on July 24 was down to 8.4 MCV up to 106, white blood count 3500 with 45% neutrophils, 51% lymphocytes, 2 monocytes compared with values in February of total white count 3200 64% neutrophils 31 lymphocytes. Platelet count which was normal in May 2016 at 231,000 has slowly drifted down to current value of 110,000 compared with 150,000 in February. Serum total protein level remains normal at 7.1 g percent. Albumin 3.8. Mild elevation of bilirubin at 1.4. He had a history of B12 deficiency and was on treatment with parenteral B12. He also takes B6 supplements. B12 level done back in February was low at 156. The patient had cut back on his dose. He went back to a therapeutic dose and repeat B12 level was 407 on July 24. Only  medication changes since last visit with me include deletion of the Glucophage and addition of Tradgenta  5 mg daily for type 2 diabetes. Hemoglobin A1c done July 24 is 6.1%. He has a history of prostate cancer treated with radiation. PSA done July 24 remains low at 0.89 units.  He is now living at [facility]. His wife is at [facility] facility. She has developed dementia. He has had the gradual loss of about 10 pounds over the last year. Appetite is good but he has developed early satiety. He has had no recurrent ulcer symptoms. He has a number of other medical problems that are currently under control.  Past medical history: Type 2 diabetes. Essential hypertension. Atrial flutter. Obstructive airway disease from previous heavy cigarette smoking 3 packs daily which he stopped over 40 years ago. Coronary artery disease status post angioplasty without stent 30 years ago. Peptic ulcer disease currently not active. Chronic renal insufficiency. Degenerative arthritis and spinal stenosis. Monoclonal gammopathy of undetermined significance. Prostate cancer as mentioned above. Surgeries: Cholecystectomy. Partial bowel obstruction related to diverticulitis requiring urgent surgery. Total right hip replacement.  Medications:  Prilosec 20 mg daily. Metoprolol 25 mg a.m., 37.5 mg p.m., B12 1 mg IM Q month, B6 50 mg PO daily, Tradgenta 5 mg daily (linagliptin). Flomax 5 mg daily. Allergies:  Allergies  Allergen Reactions  . Meperidine Hcl Other (See Comments)    REACTION: intol---causes nausea  . Insulins Other (See Comments)    No allergy but patient does NOT want any insulin therapy at this  time. He discussed this with Paul Burnett on 05/18/14    Review of Systems: See interim history  He denies any dyspnea at rest. No ischemic quality chest pain or palpitations. No abdominal pain or change in bowel habit. No hematochezia or melena. Positive nocturia. Remaining ROS negative:   Physical  Exam: Blood pressure (!) 127/56, pulse 70, temperature 98.4 F (36.9 C), temperature source Oral, height 5\' 7"  (1.702 m), weight 130 lb 3.2 oz (59.1 kg), SpO2 97 %. Wt Readings from Last 3 Encounters:  10/03/15 130 lb 3.2 oz (59.1 kg)  06/15/15 140 lb 6.4 oz (63.7 kg)  02/23/15 143 lb 12.8 oz (65.2 kg)     General appearance: Cachectic  appearing Caucasian man HENNT: Pharynx no erythema, exudate, mass, or ulcer. No thyromegaly or thyroid nodules Lymph nodes: No cervical, supraclavicular, or axillary lymphadenopathy Breasts:  Lungs: Clear to auscultation, resonant to percussion throughout Heart: Regular rhythm, no murmur, no gallop, no rub, no click, no edema Abdomen: Surgical scars from previous cholecystectomy and bowel resection for diverticulitis. Soft, nontender, normal bowel sounds, no mass, no organomegaly Extremities: No edema, no calf tenderness Musculoskeletal: no joint deformities GU:  Vascular: Carotid pulses 2+, no bruits, normal, no hemorrhage or exudate, cranial nerves grossly normal, motor strength 5 over 5, reflexes 1+ symmetric at the biceps, absent symmetric at the knees , upper body coordination normal, gait normal, vibration sensation not tested  Skin: No rash or ecchymosis. Skin is dry and scaly   Lab Results: CBC W/Diff    Component Value Date/Time   WBC 3.4 (L) 10/03/2015 0920   RBC 2.36 (L) 10/03/2015 0920   HGB 8.2 (L) 10/03/2015 0920   HGB 12.4 (L) 07/22/2010 1317   HCT 25.0 (L) 10/03/2015 0920   HCT 37.0 (L) 07/22/2010 1317   PLT 97 (L) 10/03/2015 0920   PLT 183 07/22/2010 1317   MCV 105.9 (H) 10/03/2015 0920   MCV 91.8 07/22/2010 1317   MCH 34.7 (H) 10/03/2015 0920   MCHC 32.8 10/03/2015 0920   RDW 16.5 (H) 10/03/2015 0920   RDW 14.4 07/22/2010 1317   LYMPHSABS 1.8 10/03/2015 0920   LYMPHSABS 1.4 07/22/2010 1317   MONOABS 0.0 (L) 10/03/2015 0920   MONOABS 0.3 07/22/2010 1317   EOSABS 0.0 10/03/2015 0920   EOSABS 0.1 07/22/2010 1317   BASOSABS  0.0 10/03/2015 0920   BASOSABS 0.0 07/22/2010 1317     Chemistry      Component Value Date/Time   NA 140 05/19/2014 0726   NA 144 02/06/2014 0923   K 4.0 05/19/2014 0726   K 4.4 02/06/2014 0923   CL 106 05/19/2014 0726   CO2 23 05/19/2014 0726   CO2 26 02/06/2014 0923   BUN 26 (H) 05/19/2014 0726   BUN 38.6 (H) 02/06/2014 0923   CREATININE 1.71 (H) 05/19/2014 0726   CREATININE 1.9 (H) 02/06/2014 0923      Component Value Date/Time   CALCIUM 7.9 (L) 05/19/2014 0726   CALCIUM 9.5 02/06/2014 0923   ALKPHOS 89 05/17/2014 1224   AST 27 05/17/2014 1224   ALT 14 05/17/2014 1224   BILITOT 1.3 (H) 05/17/2014 1224    CBC 10/03/2015: Hemoglobin 8.2, hematocrit 25, MCV 106, white count 3400 with 46% neutrophils, 52 lymphocytes, 1 monocyte, platelet count 97,000.  Review of peripheral blood pending:   Radiological Studies: No results found.  Impression:  Fall in hemoglobin and platelets from baseline. Development of macrocytosis. Elderly physician with previous diagnosis of IgA kappa monoclonal gammopathy. Although progression to  myeloma is certainly a possibility, normal serum protein to albumin ratio and development of macrocytic indices with normal vitamin studies points more to a diagnosis of an underlying myelodysplastic syndrome. Early Acute leukemia cannot be excluded. Another possibility would be delayed myelosuppression from radiation given to the prostate with scatter of radiation to the pelvic bone marrow.   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 Further disposition based on results. We discussed the fact that if he does have a myelodysplastic syndrome, the main prognostic factors would be obtained from the bone marrow results. The percent blasts and the presence and type of chromosome abnormalities would be prognostic in terms of long-term blood count stability and rate of conversion to leukemia over time. Some of  the myelodysplastic syndromes can be treated with observation alone. With his hemoglobin less than 10 g, he would be a candidate for an ESA. Specific chromosome changes such as deletion of chromosome 5 (5 q  minus syndrome) have a high response to oral immunomodulatory agents such as lenalidomide. We should have preliminary results on the bone marrow within 48 hours. Chromosome studies take 7-14 days. I will call him with preliminary results and set up a follow-up appointment for further discussion.   CC: Patient Care Team: Lajean Manes, Paul Burnett as PCP - General (Internal Medicine)   Annia Belt, Paul Burnett 7/26/20171:46 PM

## 2015-10-04 LAB — IMMUNOFIXATION ELECTROPHORESIS
IgA/Immunoglobulin A, Serum: 1226 mg/dL — ABNORMAL HIGH (ref 61–437)
IgG (Immunoglobin G), Serum: 633 mg/dL — ABNORMAL LOW (ref 700–1600)
IgM (Immunoglobulin M), Srm: 29 mg/dL (ref 15–143)
Total Protein: 7.5 g/dL (ref 6.0–8.5)

## 2015-10-08 ENCOUNTER — Telehealth: Payer: Self-pay | Admitting: *Deleted

## 2015-10-08 ENCOUNTER — Other Ambulatory Visit: Payer: Self-pay | Admitting: Oncology

## 2015-10-08 ENCOUNTER — Telehealth: Payer: Self-pay | Admitting: Oncology

## 2015-10-08 ENCOUNTER — Ambulatory Visit (HOSPITAL_BASED_OUTPATIENT_CLINIC_OR_DEPARTMENT_OTHER): Payer: Medicare Other | Admitting: Oncology

## 2015-10-08 ENCOUNTER — Other Ambulatory Visit (HOSPITAL_COMMUNITY)
Admission: RE | Admit: 2015-10-08 | Discharge: 2015-10-08 | Disposition: A | Discharge status: Home / Self Care | Payer: Medicare Other | Source: Ambulatory Visit | Attending: Oncology | Admitting: Oncology

## 2015-10-08 ENCOUNTER — Ambulatory Visit (HOSPITAL_BASED_OUTPATIENT_CLINIC_OR_DEPARTMENT_OTHER): Payer: Medicare Other

## 2015-10-08 VITALS — BP 133/77 | HR 72 | Temp 98.1°F | Resp 18

## 2015-10-08 DIAGNOSIS — D61818 Other pancytopenia: Secondary | ICD-10-CM

## 2015-10-08 DIAGNOSIS — C9000 Multiple myeloma not having achieved remission: Secondary | ICD-10-CM | POA: Insufficient documentation

## 2015-10-08 DIAGNOSIS — D539 Nutritional anemia, unspecified: Secondary | ICD-10-CM | POA: Diagnosis not present

## 2015-10-08 DIAGNOSIS — D472 Monoclonal gammopathy: Secondary | ICD-10-CM

## 2015-10-08 LAB — CBC WITH DIFFERENTIAL/PLATELET
BASO%: 0 % (ref 0.0–2.0)
Basophils Absolute: 0 10*3/uL (ref 0.0–0.1)
EOS ABS: 0.1 10*3/uL (ref 0.0–0.5)
EOS%: 1.7 % (ref 0.0–7.0)
HEMATOCRIT: 24.1 % — AB (ref 38.4–49.9)
HGB: 8.2 g/dL — ABNORMAL LOW (ref 13.0–17.1)
LYMPH#: 1.2 10*3/uL (ref 0.9–3.3)
LYMPH%: 43.1 % (ref 14.0–49.0)
MCH: 35.5 pg — ABNORMAL HIGH (ref 27.2–33.4)
MCHC: 34 g/dL (ref 32.0–36.0)
MCV: 104.3 fL — AB (ref 79.3–98.0)
MONO#: 0.1 10*3/uL (ref 0.1–0.9)
MONO%: 3.8 % (ref 0.0–14.0)
NEUT%: 51.4 % (ref 39.0–75.0)
NEUTROS ABS: 1.5 10*3/uL (ref 1.5–6.5)
PLATELETS: 99 10*3/uL — AB (ref 140–400)
RBC: 2.31 10*6/uL — ABNORMAL LOW (ref 4.20–5.82)
RDW: 16.7 % — ABNORMAL HIGH (ref 11.0–14.6)
WBC: 2.9 10*3/uL — ABNORMAL LOW (ref 4.0–10.3)

## 2015-10-08 LAB — BONE MARROW EXAM

## 2015-10-08 NOTE — Telephone Encounter (Signed)
Added lab per pof

## 2015-10-08 NOTE — Progress Notes (Signed)
Bone Marrow Biopsy and Aspiration Procedure Note   Informed consent was obtained and potential risks including bleeding, infection and pain were reviewed with the patient.  Red Rule procedure followed  Posterior iliac crest(s) prepped with Betadine.  Lidocaine 1% local anesthesia infiltrated into the subcutaneous tissue.  Premedication: none  Left bone marrow biopsy and  aspirate was obtained.   The procedure was tolerated well and there were no complications.  Specimens sent for routine histopathologic stains and sectioning, flow cytometry and cytogenetics  Indication: progressive pancytopenia; history of IgA kappa MGUS  Physician: Annia Belt

## 2015-10-08 NOTE — Progress Notes (Signed)
Successful bone marrow procedure performed by [name]. Patient VS stable pre and post procedure.

## 2015-10-08 NOTE — Progress Notes (Signed)
Bone Marrow Biopsy and Aspiration Procedure Note   Informed consent was obtained and potential risks including bleeding, infection and pain were reviewed with the patient.  Red Rule procedure followed  Posterior iliac crest(s) prepped with Betadine.  Lidocaine 1% local anesthesia infiltrated into the subcutaneous tissue.  Premedication: none  Left bone marrow biopsy and  aspirate was obtained.   The procedure was tolerated well and there were no complications.  Specimens sent for routine histopathologic stains and sectioning, flow cytometry and cytogenetics  Indication: progressive pancytopenia; history of IgA kappa MUGUS  Physician: Annia Belt

## 2015-10-09 LAB — ERYTHROPOIETIN: Erythropoietin: 529.6 m[IU]/mL — ABNORMAL HIGH (ref 2.6–18.5)

## 2015-10-09 LAB — BETA 2 MICROGLOBULIN, SERUM: Beta-2: 6.5 mg/L — ABNORMAL HIGH (ref 0.6–2.4)

## 2015-10-09 NOTE — Telephone Encounter (Signed)
Late entry for 7/31; call from hematology stating patient's cbc had not resulted, and wondered if patient went to lab appt after bone marrow biopsy.  Patient unaware of lab appt and went home. This RN called patient to see if he could return back sometime to have labs drawn. Pt upset but stated he could come back at 1pm. Lab made aware that patient would be back this afternoon for lab draw.

## 2015-10-10 ENCOUNTER — Other Ambulatory Visit: Payer: Self-pay | Admitting: Oncology

## 2015-10-10 DIAGNOSIS — C9000 Multiple myeloma not having achieved remission: Secondary | ICD-10-CM

## 2015-10-11 ENCOUNTER — Encounter: Payer: Self-pay | Admitting: Oncology

## 2015-10-11 ENCOUNTER — Telehealth: Payer: Self-pay | Admitting: *Deleted

## 2015-10-11 ENCOUNTER — Other Ambulatory Visit: Payer: Self-pay | Admitting: Oncology

## 2015-10-11 ENCOUNTER — Ambulatory Visit (HOSPITAL_COMMUNITY)
Admission: RE | Admit: 2015-10-11 | Discharge: 2015-10-11 | Disposition: A | Discharge status: Home / Self Care | Payer: Medicare Other | Source: Ambulatory Visit | Attending: Oncology | Admitting: Oncology

## 2015-10-11 DIAGNOSIS — M5134 Other intervertebral disc degeneration, thoracic region: Secondary | ICD-10-CM | POA: Diagnosis not present

## 2015-10-11 DIAGNOSIS — C9000 Multiple myeloma not having achieved remission: Secondary | ICD-10-CM

## 2015-10-11 DIAGNOSIS — M5136 Other intervertebral disc degeneration, lumbar region: Secondary | ICD-10-CM | POA: Diagnosis not present

## 2015-10-11 DIAGNOSIS — M5030 Other cervical disc degeneration, unspecified cervical region: Secondary | ICD-10-CM | POA: Diagnosis not present

## 2015-10-11 DIAGNOSIS — D61818 Other pancytopenia: Secondary | ICD-10-CM | POA: Diagnosis not present

## 2015-10-11 HISTORY — DX: Multiple myeloma not having achieved remission: C90.00

## 2015-10-11 LAB — KAPPA/LAMBDA LIGHT CHAINS
Ig Kappa Free Light Chain: 402.6 mg/L — ABNORMAL HIGH (ref 3.3–19.4)
Ig Lambda Free Light Chain: 16.6 mg/L (ref 5.7–26.3)
KAPPA/LAMBDA FLC RATIO: 24.25 — AB (ref 0.26–1.65)

## 2015-10-11 MED ORDER — DEXAMETHASONE 4 MG PO TABS: 20.0000 mg | ORAL_TABLET | ORAL | 6 refills | Status: DC

## 2015-10-11 NOTE — Telephone Encounter (Signed)
[employer] stated they would deliver Dexamethasone to [employer] per pt's request.

## 2015-10-11 NOTE — Progress Notes (Signed)
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 IgA level at that time 483 mg percent. He has chronic renal insufficiency. Creatinine slightly higher than baseline at that time but no proteinuria or signs of light chain disease. Current bone marrow shows heavy involvement with multiple myeloma, 84% plasma cells. Current hemoglobin 8.2, white count 2900 with 51% neutrophils, platelet count 99,000 done July 31. Chemistry profile done July 24 2 his internist's office with total protein 7.1, albumin 3.8, calcium 9.7, BUN 36, creatinine 1.7. Beta-2 microglobulin elevated at 6.5 Kappa serum free light chains significantly elevated at 402.6 mg percent Kappa/lambda ratio and 24.25 Skeletal bone survey done today shows no lytic lesions. He has chronic back pain. Pain has been worse over the last 2 months. His son is an Doctor, general practice. He had lumbar spine films 2 months ago. He has severe degenerative arthritis and spinal disc disease and has had 4 prior back surgeries.  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 There is no reason why he should not be offered treatment. Best available treatment for someone in his age range is a combination of dose adjusted lenalidomide (Revlimid) 10 mg daily 21 days on 7 days rest plus dose adjusted dexamethasone 20 mg by mouth weekly. Aspirin prophylaxis in view of increased clotting risk with the Revlimid. Bisphosphonate therapy with Zometa 4 mg IV monthly to inhibit bone breakdown.  In  clinical trial results published in the Journal of clinical oncology 12/28/2014 volume 34 page 3609, in the patient cohort age over 63 years, medium progression free survival was 20 months with 26% of patients still free of disease at 4 years. I think that these are very respectable numbers for an older patient.  We discussed the regimen as well as the potential side effects of all of the components. Revlimid can cause constipation, neuropathy, further fall in the white count and platelets, further decrease in renal function if not dose adjusted. Increased risk of thrombosis. Decadron will cause poor control of his diabetes. Discussed with Dr. Felipa Eth who will assist in his diabetes management. Zometa can rarely cause osteonecrosis of the jaw with breakdown of the soft tissues of the gums and exposure of the underlying mandible.  Dr. [name] understands and agrees to proceed with treatment. We will start the dexamethasone immediately while I make arrangements to procure the Revlimid.

## 2015-10-12 ENCOUNTER — Telehealth: Payer: Self-pay | Admitting: *Deleted

## 2015-10-12 NOTE — Telephone Encounter (Signed)
-----   Message from Annia Belt, MD sent at 10/11/2015  2:37 PM EDT ----- Call pt. Bone Xrays no fractures or changes from myeloma. Note: he is hard of hearing

## 2015-10-12 NOTE — Telephone Encounter (Signed)
Pt called / informed "Bone Xrays no fractures or changes from myeloma" per Dr Beryle Beams. Stated thank-you.

## 2015-10-16 ENCOUNTER — Telehealth: Payer: Self-pay | Admitting: *Deleted

## 2015-10-16 NOTE — Telephone Encounter (Signed)
PA request for Revlimid completed by MD and faxed to [facility] on 10/15/2015.  Received faxed notice that medication has been approved through 04/16/2016.  The notice states the request was approved for 20mg  capsules, but PA request was submitted for 10 mg capsules.  I contacted [employer] to have them run a "test claim" on the 10 mg capsules and was informed that the 10mg  dose was also covered.  Phone call complete.Despina Hidden Cassady8/8/201711:42 AM     [employer]  [phone] [place] ID: [id number] Authorization #: [id number]

## 2015-10-17 LAB — CHROMOSOME ANALYSIS, BONE MARROW

## 2015-10-17 LAB — TISSUE HYBRIDIZATION (BONE MARROW)-NCBH

## 2015-10-19 ENCOUNTER — Other Ambulatory Visit: Payer: Self-pay | Admitting: Oncology

## 2015-10-19 DIAGNOSIS — C9000 Multiple myeloma not having achieved remission: Secondary | ICD-10-CM

## 2015-10-19 DIAGNOSIS — D649 Anemia, unspecified: Secondary | ICD-10-CM

## 2015-10-22 ENCOUNTER — Encounter (HOSPITAL_COMMUNITY)
Admission: RE | Admit: 2015-10-22 | Discharge: 2015-10-22 | Disposition: A | Discharge status: Home / Self Care | Payer: Medicare Other | Source: Ambulatory Visit | Attending: Oncology | Admitting: Oncology

## 2015-10-22 DIAGNOSIS — C9000 Multiple myeloma not having achieved remission: Secondary | ICD-10-CM | POA: Diagnosis present

## 2015-10-22 DIAGNOSIS — D649 Anemia, unspecified: Secondary | ICD-10-CM | POA: Diagnosis not present

## 2015-10-22 LAB — CBC
HCT: 23.2 % — ABNORMAL LOW (ref 39.0–52.0)
Hemoglobin: 7.6 g/dL — ABNORMAL LOW (ref 13.0–17.0)
MCH: 35.3 pg — ABNORMAL HIGH (ref 26.0–34.0)
MCHC: 32.8 g/dL (ref 30.0–36.0)
MCV: 107.9 fL — ABNORMAL HIGH (ref 78.0–100.0)
PLATELETS: 83 10*3/uL — AB (ref 150–400)
RBC: 2.15 MIL/uL — AB (ref 4.22–5.81)
RDW: 17 % — ABNORMAL HIGH (ref 11.5–15.5)
WBC: 3.4 10*3/uL — AB (ref 4.0–10.5)

## 2015-10-22 LAB — PREPARE RBC (CROSSMATCH)

## 2015-10-22 MED ORDER — SODIUM CHLORIDE 0.9 % IV SOLN: Freq: Once | INTRAVENOUS | Status: DC

## 2015-10-23 LAB — TYPE AND SCREEN
ABO/RH(D): AB POS
Antibody Screen: NEGATIVE
UNIT DIVISION: 0
Unit division: 0

## 2015-10-24 ENCOUNTER — Telehealth: Payer: Self-pay

## 2015-10-30 ENCOUNTER — Telehealth: Payer: Self-pay | Admitting: *Deleted

## 2015-10-30 NOTE — Telephone Encounter (Signed)
Thanks

## 2015-10-30 NOTE — Telephone Encounter (Signed)
Call made to [place] to assist in locating a pharmacy that will deliver pt's revlimid.  Was directed toward a web site for list of pharmacies  ([email or web address]).   [place] [facility] at [phone] to call in rx, but unfortunately, they're not allowed to take verbal rxs.  Rx must be faxed to [phone] or submitted electronically.  Once received they will process rx and reach out to patient once filled.  They will also reach out to our office if there is problem.  Of note, I contacted pt's insurance to make sure PA was for the 10mg  caps and I was assured PA was complete..  I also contacted [employer] to make sure pt's was registered with them and his application was complete. I will contact pt and/or pt's dtr to make them aware.  I will also send to prescribing MD to electronically send new rx to [facility].Despina Hidden Cassady8/22/201712:05 PM

## 2015-10-31 MED ORDER — LENALIDOMIDE 10 MG PO CAPS: ORAL_CAPSULE | ORAL | 6 refills | Status: DC

## 2015-10-31 NOTE — Telephone Encounter (Signed)
Made multiple attempts to fax prescription at the number provided, but was unsuccessful.  Rx sent electronically to [place].Despina Hidden Cassady8/23/20179:16 AM

## 2015-10-31 NOTE — Telephone Encounter (Signed)
Received call from [facility] requesting an authorization # from [place].  Contacted [employer] and was provided with auth # [id number]).    Of note, MD will have to call [place] every month to obtain an authorization number for this medication. The auth # will then have to be called to [facility].    Spoke with patient this morning regarding rx, he was provided with [facility] contact info.  He will call if he does not hear anything from them by 1PM today.  Despina Hidden Cassady8/23/201710:05 AM    [facility]  [place] [phone] (Select option #2-prescriber then #5 for represenative)  Pt [facility] ID# [id number]   [facility] PA# [id number] (will expire on 04/16/2016)

## 2015-10-31 NOTE — Telephone Encounter (Signed)
Received call from [facility] requesting ICD-10 code for rx-was provided with code, phone call complete.Despina Hidden Cassady8/23/20174:26 PM

## 2015-11-05 ENCOUNTER — Encounter (HOSPITAL_COMMUNITY): Payer: Self-pay

## 2015-11-05 ENCOUNTER — Other Ambulatory Visit: Payer: Self-pay | Admitting: Oncology

## 2015-11-05 DIAGNOSIS — D539 Nutritional anemia, unspecified: Secondary | ICD-10-CM

## 2015-11-05 DIAGNOSIS — C9000 Multiple myeloma not having achieved remission: Secondary | ICD-10-CM

## 2015-11-06 ENCOUNTER — Telehealth: Payer: Self-pay | Admitting: *Deleted

## 2015-11-06 ENCOUNTER — Other Ambulatory Visit (INDEPENDENT_AMBULATORY_CARE_PROVIDER_SITE_OTHER): Payer: Medicare Other

## 2015-11-06 ENCOUNTER — Ambulatory Visit (INDEPENDENT_AMBULATORY_CARE_PROVIDER_SITE_OTHER): Payer: Medicare Other | Admitting: Oncology

## 2015-11-06 DIAGNOSIS — C9000 Multiple myeloma not having achieved remission: Secondary | ICD-10-CM | POA: Diagnosis present

## 2015-11-06 DIAGNOSIS — D61818 Other pancytopenia: Secondary | ICD-10-CM

## 2015-11-06 DIAGNOSIS — Z87891 Personal history of nicotine dependence: Secondary | ICD-10-CM

## 2015-11-06 DIAGNOSIS — D539 Nutritional anemia, unspecified: Secondary | ICD-10-CM

## 2015-11-06 DIAGNOSIS — N184 Chronic kidney disease, stage 4 (severe): Secondary | ICD-10-CM

## 2015-11-06 LAB — CBC WITH DIFFERENTIAL/PLATELET
Basophils Absolute: 0 10*3/uL (ref 0.0–0.1)
Basophils Relative: 2 %
Eosinophils Absolute: 0.1 10*3/uL (ref 0.0–0.7)
Eosinophils Relative: 3 %
HEMATOCRIT: 25.5 % — AB (ref 39.0–52.0)
Hemoglobin: 8.6 g/dL — ABNORMAL LOW (ref 13.0–17.0)
LYMPHS ABS: 0.8 10*3/uL (ref 0.7–4.0)
Lymphocytes Relative: 35 %
MCH: 33.7 pg (ref 26.0–34.0)
MCHC: 33.7 g/dL (ref 30.0–36.0)
MCV: 100 fL (ref 78.0–100.0)
MONO ABS: 0 10*3/uL — AB (ref 0.1–1.0)
Monocytes Relative: 2 %
Neutro Abs: 1.3 10*3/uL — ABNORMAL LOW (ref 1.7–7.7)
Neutrophils Relative %: 58 %
PLATELETS: 72 10*3/uL — AB (ref 150–400)
RBC: 2.55 MIL/uL — ABNORMAL LOW (ref 4.22–5.81)
RDW: 19.5 % — AB (ref 11.5–15.5)
WBC: 2.2 10*3/uL — AB (ref 4.0–10.5)

## 2015-11-06 LAB — COMPREHENSIVE METABOLIC PANEL
ALT: 13 U/L — AB (ref 17–63)
AST: 18 U/L (ref 15–41)
Albumin: 3.2 g/dL — ABNORMAL LOW (ref 3.5–5.0)
Alkaline Phosphatase: 121 U/L (ref 38–126)
Anion gap: 9 (ref 5–15)
BILIRUBIN TOTAL: 2.6 mg/dL — AB (ref 0.3–1.2)
BUN: 44 mg/dL — AB (ref 6–20)
CHLORIDE: 104 mmol/L (ref 101–111)
CO2: 26 mmol/L (ref 22–32)
CREATININE: 2.17 mg/dL — AB (ref 0.61–1.24)
Calcium: 10.5 mg/dL — ABNORMAL HIGH (ref 8.9–10.3)
GFR, EST AFRICAN AMERICAN: 29 mL/min — AB (ref >60)
GFR, EST NON AFRICAN AMERICAN: 25 mL/min — AB (ref >60)
Glucose, Bld: 135 mg/dL — ABNORMAL HIGH (ref 65–99)
Potassium: 4.6 mmol/L (ref 3.5–5.1)
Sodium: 139 mmol/L (ref 135–145)
TOTAL PROTEIN: 7.6 g/dL (ref 6.5–8.1)

## 2015-11-06 LAB — TYPE AND SCREEN
ABO/RH(D): AB POS
Antibody Screen: NEGATIVE

## 2015-11-06 MED ORDER — LENALIDOMIDE 10 MG PO CAPS: ORAL_CAPSULE | ORAL | 6 refills | Status: DC

## 2015-11-06 NOTE — Patient Instructions (Addendum)
Change Revlimid to one 10 mg tab every three days: for this cycle take revlimid on 8/31,9/3,9/6 then stop until 9/14. Continue decadron 20 mg = 5, 4 mg  Tablets once a week Cbc 7 chem profile every week  Will will schedule Zometa for the next time you come in for lab next Tuesday, 11/13/15 Visit with Dr Darnell Level on 9/18 at 2 PM

## 2015-11-06 NOTE — Telephone Encounter (Signed)
Pt called /informed of appt on Wed 11/13/15 @ 0800 AM at [facility] for [facility]. Pt awared to register at Admissions. Stated will have labs drawn after infusion here at [facility].

## 2015-11-06 NOTE — Progress Notes (Signed)
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 11/06/2015 5:42 PM   Principle Diagnosis: Encounter Diagnoses  Name Primary?  . Pancytopenia ([place])   . Multiple myeloma not having achieved remission (HCC) Yes  . 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 He had a sudden drop in his hemoglobin from baseline of 10 g to 8 g with concomitant minor fall in his platelet count and white count which prompted reevaluation. No hypercalcemia. Albumin normal. Beta 2 microglobulin elevated. He has chronic renal insufficiency with creatinines 1.7-1.9. No lesions on skeletal bone survey although he is having increasing back pain likely from bone marrow expansion from the myeloma.  He was started on dexamethasone 20 mg weekly on 10/11/2015. After jumping through multiple insurance hoops, he started Revlimid on August 24 with initial planned dose 10 mg 21 days with a 7 day rest. Low-dose aspirin thromboprophylaxis. Plan to begin monthly Zometa on September 6. Despite underlying type 2 diabetes, he has tolerated the steroids well. He has had no early side effects from the Revlimid. Blood counts today concerning in view of a rise in his creatinine from 1.7 to 2.2. BUN from 26 to 44. Calcium now 10.5 with albumin 3.2. White count down from 3400 to 2200. Platelets down from 99,000 to 72,000. Hemoglobin stable at 8.6 g. He had an interim 2 unit packed cell transfusion on August 14 when hemoglobin was 7.6.  Medications: reviewed  Allergies:  Allergies  Allergen Reactions  . Meperidine Hcl Other (See Comments)    REACTION: intol---causes nausea  . Insulins Other (See Comments)    No allergy but  patient does NOT want any insulin therapy at this time. He discussed this with MD on 05/18/14    Review of Systems: See interim history Remaining ROS negative:   Physical Exam: There were no vitals taken for this visit. Wt Readings from Last 3 Encounters:  10/22/15 120 lb (54.4 kg)  10/03/15 130 lb 3.2 oz (59.1 kg)  06/15/15 140 lb 6.4 oz (63.7 kg)     General appearance: Frail, chronically ill-appearing Caucasian man Complete exam not done today. This was a work in visit when he went to be evaluated for another blood transfusion. Lab Results: CBC W/Diff    Component Value Date/Time   WBC 2.2 (L) 11/06/2015 0855   RBC 2.55 (L) 11/06/2015 0855   HGB 8.6 (L) 11/06/2015 0855   HGB 8.2 (L) 10/08/2015 1252   HCT 25.5 (L) 11/06/2015 0855   HCT 24.1 (L) 10/08/2015 1252   PLT 72 (L) 11/06/2015 0855   PLT 99 (L) 10/08/2015 1252   MCV 100.0 11/06/2015 0855   MCV 104.3 (H) 10/08/2015 1252   MCH 33.7 11/06/2015 0855   MCHC 33.7 11/06/2015 0855   RDW 19.5 (H) 11/06/2015 0855   RDW 16.7 (H) 10/08/2015 1252   LYMPHSABS 0.8 11/06/2015 0855   LYMPHSABS 1.2 10/08/2015 1252   MONOABS 0.0 (L) 11/06/2015 0855   MONOABS 0.1 10/08/2015 1252   EOSABS 0.1 11/06/2015 0855   EOSABS 0.1 10/08/2015 1252   BASOSABS 0.0 11/06/2015 0855   BASOSABS 0.0 10/08/2015 1252     Chemistry      Component Value Date/Time   NA 139  11/06/2015 0855   NA 144 02/06/2014 0923   K 4.6 11/06/2015 0855   K 4.4 02/06/2014 0923   CL 104 11/06/2015 0855   CO2 26 11/06/2015 0855   CO2 26 02/06/2014 0923   BUN 44 (H) 11/06/2015 0855   BUN 38.6 (H) 02/06/2014 0923   CREATININE 2.17 (H) 11/06/2015 0855   CREATININE 1.9 (H) 02/06/2014 0923      Component Value Date/Time   CALCIUM 10.5 (H) 11/06/2015 0855   CALCIUM 9.5 02/06/2014 0923   ALKPHOS 121 11/06/2015 0855   AST 18 11/06/2015 0855   ALT 13 (L) 11/06/2015 0855   BILITOT 2.6 (H) 11/06/2015 0855       Radiological Studies: Dg Bone Survey Met  Result  Date: 10/11/2015 CLINICAL DATA:  New diagnosis of myeloma, staging evaluation, the patient reports back pain. EXAM: METASTATIC BONE SURVEY COMPARISON:  None in PACs FINDINGS: Skull and spine: The skull exhibits no lytic nor blastic lesion. The cervical, thoracic, and lumbar spines exhibit multilevel degenerative disc disease. There is no compression fracture. No lytic nor blastic lesions are observed. Chest and upper extremities: The lungs are adequately inflated and clear. The heart and pulmonary vascularity are normal. There is calcification in the wall of the aortic arch. The ribs and clavicles exhibit no lytic nor blastic lesions. No lytic or blastic lesions in the upper extremities are observed. Pelvis and lower extremities: The bony pelvis is osteopenic. No lytic or blastic lesion is observed. 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 There is severe degenerative change with bone on bone appearance of the left hip. No lytic or blastic bony lesions are observed. IMPRESSION: 1. No skeletal changes to suggest myelomatous involvement. 2. Multilevel degenerative disc disease of the spine. 3. Severe degenerative change of the left hip joint with bone on bone appearance. 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 Electronically Signed   By: David  Martinique M.D.   On: 10/11/2015 11:41    Impression:  IgG kappa multiple myeloma  Heavy bone marrow involvement. Deterioration in renal function and borderline hypercalcemia. Only just started Revlimid 6 days ago. I'm going to move up the dose of Zometa which was electively planned for next week. IV fluids at time of Zometa administration. I'm going to have to cut his Revlimid dose down until renal function is more stable. I advised him to decrease from 10 mg daily to 10 mg every 3 days. Monitor blood counts weekly. Continue weekly Decadron.  His daughter Cheskel Silverio here today in from [facility] where she lives and works. His son Yahshua Thibault is an orthopedic surgeon  here in town.  I will be available only by phone between September 2 and September 17. Dr. Julieanne Manson has kindly agreed to cover me for this patient and I gave his pager number to Dr. [name] as well as the contact number for his nurse practitioner Ms. Ned Card.     CC: Patient Care Team: Lajean Manes, MD as PCP - General (Internal Medicine)   Annia Belt, MD 8/29/20175:42 PM

## 2015-11-07 ENCOUNTER — Encounter (HOSPITAL_COMMUNITY)
Admission: RE | Admit: 2015-11-07 | Discharge: 2015-11-07 | Disposition: A | Discharge status: Home / Self Care | Payer: Medicare Other | Source: Ambulatory Visit | Attending: Oncology | Admitting: Oncology

## 2015-11-07 ENCOUNTER — Other Ambulatory Visit: Payer: Self-pay | Admitting: Oncology

## 2015-11-07 DIAGNOSIS — C9000 Multiple myeloma not having achieved remission: Secondary | ICD-10-CM

## 2015-11-07 DIAGNOSIS — N179 Acute kidney failure, unspecified: Secondary | ICD-10-CM

## 2015-11-07 MED ORDER — SODIUM CHLORIDE 0.9 % IV SOLN
INTRAVENOUS | Status: DC
  Administered 2015-11-07: 10:00:00 via INTRAVENOUS

## 2015-11-07 MED ORDER — ZOLEDRONIC ACID 4 MG/5ML IV CONC
3.0000 mg | Freq: Once | INTRAVENOUS | Status: AC
  Administered 2015-11-07: 3 mg via INTRAVENOUS
  Filled 2015-11-07: qty 3.75

## 2015-11-08 ENCOUNTER — Encounter (HOSPITAL_COMMUNITY): Payer: Self-pay | Admitting: Emergency Medicine

## 2015-11-08 ENCOUNTER — Inpatient Hospital Stay (HOSPITAL_COMMUNITY)
Admission: EM | Admit: 2015-11-08 | Discharge: 2015-11-17 | DRG: 871 | Disposition: A | Discharge status: Skilled Nursing Facility | Payer: Medicare Other | Attending: Internal Medicine | Admitting: Internal Medicine

## 2015-11-08 ENCOUNTER — Other Ambulatory Visit: Payer: Self-pay

## 2015-11-08 ENCOUNTER — Emergency Department (HOSPITAL_COMMUNITY): Payer: Medicare Other

## 2015-11-08 DIAGNOSIS — K219 Gastro-esophageal reflux disease without esophagitis: Secondary | ICD-10-CM | POA: Diagnosis present

## 2015-11-08 DIAGNOSIS — D61818 Other pancytopenia: Secondary | ICD-10-CM | POA: Diagnosis present

## 2015-11-08 DIAGNOSIS — J9691 Respiratory failure, unspecified with hypoxia: Secondary | ICD-10-CM | POA: Diagnosis present

## 2015-11-08 DIAGNOSIS — Z66 Do not resuscitate: Secondary | ICD-10-CM | POA: Diagnosis present

## 2015-11-08 DIAGNOSIS — Z681 Body mass index (BMI) 19 or less, adult: Secondary | ICD-10-CM

## 2015-11-08 DIAGNOSIS — R4182 Altered mental status, unspecified: Secondary | ICD-10-CM

## 2015-11-08 DIAGNOSIS — Z79899 Other long term (current) drug therapy: Secondary | ICD-10-CM

## 2015-11-08 DIAGNOSIS — E43 Unspecified severe protein-calorie malnutrition: Secondary | ICD-10-CM | POA: Diagnosis present

## 2015-11-08 DIAGNOSIS — Z87891 Personal history of nicotine dependence: Secondary | ICD-10-CM | POA: Diagnosis not present

## 2015-11-08 DIAGNOSIS — G8929 Other chronic pain: Secondary | ICD-10-CM | POA: Diagnosis present

## 2015-11-08 DIAGNOSIS — I129 Hypertensive chronic kidney disease with stage 1 through stage 4 chronic kidney disease, or unspecified chronic kidney disease: Secondary | ICD-10-CM | POA: Diagnosis present

## 2015-11-08 DIAGNOSIS — J449 Chronic obstructive pulmonary disease, unspecified: Secondary | ICD-10-CM | POA: Diagnosis present

## 2015-11-08 DIAGNOSIS — R6520 Severe sepsis without septic shock: Secondary | ICD-10-CM | POA: Diagnosis not present

## 2015-11-08 DIAGNOSIS — C9000 Multiple myeloma not having achieved remission: Secondary | ICD-10-CM | POA: Diagnosis not present

## 2015-11-08 DIAGNOSIS — H9190 Unspecified hearing loss, unspecified ear: Secondary | ICD-10-CM | POA: Diagnosis present

## 2015-11-08 DIAGNOSIS — G9341 Metabolic encephalopathy: Secondary | ICD-10-CM | POA: Diagnosis present

## 2015-11-08 DIAGNOSIS — R5081 Fever presenting with conditions classified elsewhere: Secondary | ICD-10-CM | POA: Diagnosis present

## 2015-11-08 DIAGNOSIS — M549 Dorsalgia, unspecified: Secondary | ICD-10-CM | POA: Diagnosis present

## 2015-11-08 DIAGNOSIS — D703 Neutropenia due to infection: Secondary | ICD-10-CM

## 2015-11-08 DIAGNOSIS — T3695XA Adverse effect of unspecified systemic antibiotic, initial encounter: Secondary | ICD-10-CM | POA: Diagnosis present

## 2015-11-08 DIAGNOSIS — N183 Chronic kidney disease, stage 3 (moderate): Secondary | ICD-10-CM | POA: Diagnosis present

## 2015-11-08 DIAGNOSIS — I2510 Atherosclerotic heart disease of native coronary artery without angina pectoris: Secondary | ICD-10-CM | POA: Diagnosis present

## 2015-11-08 DIAGNOSIS — I959 Hypotension, unspecified: Secondary | ICD-10-CM | POA: Diagnosis present

## 2015-11-08 DIAGNOSIS — D709 Neutropenia, unspecified: Secondary | ICD-10-CM | POA: Diagnosis not present

## 2015-11-08 DIAGNOSIS — N179 Acute kidney failure, unspecified: Secondary | ICD-10-CM | POA: Diagnosis present

## 2015-11-08 DIAGNOSIS — K521 Toxic gastroenteritis and colitis: Secondary | ICD-10-CM | POA: Diagnosis present

## 2015-11-08 DIAGNOSIS — N184 Chronic kidney disease, stage 4 (severe): Secondary | ICD-10-CM

## 2015-11-08 DIAGNOSIS — R509 Fever, unspecified: Secondary | ICD-10-CM

## 2015-11-08 DIAGNOSIS — E1122 Type 2 diabetes mellitus with diabetic chronic kidney disease: Secondary | ICD-10-CM | POA: Diagnosis present

## 2015-11-08 DIAGNOSIS — R197 Diarrhea, unspecified: Secondary | ICD-10-CM | POA: Diagnosis not present

## 2015-11-08 DIAGNOSIS — Z8546 Personal history of malignant neoplasm of prostate: Secondary | ICD-10-CM | POA: Diagnosis not present

## 2015-11-08 DIAGNOSIS — R64 Cachexia: Secondary | ICD-10-CM | POA: Diagnosis present

## 2015-11-08 DIAGNOSIS — D708 Other neutropenia: Secondary | ICD-10-CM | POA: Diagnosis not present

## 2015-11-08 DIAGNOSIS — G4700 Insomnia, unspecified: Secondary | ICD-10-CM | POA: Diagnosis present

## 2015-11-08 DIAGNOSIS — R6521 Severe sepsis with septic shock: Secondary | ICD-10-CM | POA: Diagnosis not present

## 2015-11-08 DIAGNOSIS — Z7952 Long term (current) use of systemic steroids: Secondary | ICD-10-CM

## 2015-11-08 DIAGNOSIS — A419 Sepsis, unspecified organism: Principal | ICD-10-CM | POA: Diagnosis present

## 2015-11-08 DIAGNOSIS — I480 Paroxysmal atrial fibrillation: Secondary | ICD-10-CM | POA: Diagnosis present

## 2015-11-08 DIAGNOSIS — I25811 Atherosclerosis of native coronary artery of transplanted heart without angina pectoris: Secondary | ICD-10-CM | POA: Diagnosis not present

## 2015-11-08 DIAGNOSIS — D701 Agranulocytosis secondary to cancer chemotherapy: Secondary | ICD-10-CM | POA: Diagnosis present

## 2015-11-08 DIAGNOSIS — F40240 Claustrophobia: Secondary | ICD-10-CM | POA: Diagnosis present

## 2015-11-08 DIAGNOSIS — Z7982 Long term (current) use of aspirin: Secondary | ICD-10-CM

## 2015-11-08 DIAGNOSIS — I4891 Unspecified atrial fibrillation: Secondary | ICD-10-CM | POA: Diagnosis present

## 2015-11-08 DIAGNOSIS — Z885 Allergy status to narcotic agent status: Secondary | ICD-10-CM

## 2015-11-08 DIAGNOSIS — D696 Thrombocytopenia, unspecified: Secondary | ICD-10-CM | POA: Diagnosis not present

## 2015-11-08 DIAGNOSIS — G608 Other hereditary and idiopathic neuropathies: Secondary | ICD-10-CM | POA: Diagnosis present

## 2015-11-08 DIAGNOSIS — I453 Trifascicular block: Secondary | ICD-10-CM | POA: Diagnosis not present

## 2015-11-08 LAB — COMPREHENSIVE METABOLIC PANEL
ALBUMIN: 2.5 g/dL — AB (ref 3.5–5.0)
ALK PHOS: 82 U/L (ref 38–126)
ALT: 11 U/L — ABNORMAL LOW (ref 17–63)
ANION GAP: 12 (ref 5–15)
AST: 17 U/L (ref 15–41)
BUN: 48 mg/dL — AB (ref 6–20)
CALCIUM: 9 mg/dL (ref 8.9–10.3)
CO2: 19 mmol/L — AB (ref 22–32)
Chloride: 105 mmol/L (ref 101–111)
Creatinine, Ser: 2.55 mg/dL — ABNORMAL HIGH (ref 0.61–1.24)
GFR calc Af Amer: 24 mL/min — ABNORMAL LOW (ref >60)
GFR calc non Af Amer: 21 mL/min — ABNORMAL LOW (ref >60)
GLUCOSE: 150 mg/dL — AB (ref 65–99)
Potassium: 3.9 mmol/L (ref 3.5–5.1)
SODIUM: 136 mmol/L (ref 135–145)
Total Bilirubin: 2 mg/dL — ABNORMAL HIGH (ref 0.3–1.2)
Total Protein: 5.7 g/dL — ABNORMAL LOW (ref 6.5–8.1)

## 2015-11-08 LAB — CBC WITH DIFFERENTIAL/PLATELET
BASOS ABS: 0 10*3/uL (ref 0.0–0.1)
BASOS ABS: 0 10*3/uL (ref 0.0–0.1)
BASOS PCT: 1 %
Basophils Relative: 2 %
EOS ABS: 0 10*3/uL (ref 0.0–0.7)
EOS PCT: 3 %
Eosinophils Absolute: 0 10*3/uL (ref 0.0–0.7)
Eosinophils Relative: 2 %
HCT: 28.4 % — ABNORMAL LOW (ref 39.0–52.0)
HEMATOCRIT: 19.5 % — AB (ref 39.0–52.0)
HEMOGLOBIN: 6.4 g/dL — AB (ref 13.0–17.0)
HEMOGLOBIN: 9.3 g/dL — AB (ref 13.0–17.0)
LYMPHS PCT: 20 %
LYMPHS PCT: 19 %
Lymphs Abs: 0.2 10*3/uL — ABNORMAL LOW (ref 0.7–4.0)
Lymphs Abs: 0.3 10*3/uL — ABNORMAL LOW (ref 0.7–4.0)
MCH: 33.3 pg (ref 26.0–34.0)
MCH: 31.2 pg (ref 26.0–34.0)
MCHC: 32.8 g/dL (ref 30.0–36.0)
MCHC: 32.7 g/dL (ref 30.0–36.0)
MCV: 101.6 fL — ABNORMAL HIGH (ref 78.0–100.0)
MCV: 95.3 fL (ref 78.0–100.0)
MONO ABS: 0.1 10*3/uL (ref 0.1–1.0)
MONOS PCT: 2 %
MONOS PCT: 6 %
Monocytes Absolute: 0 10*3/uL — ABNORMAL LOW (ref 0.1–1.0)
NEUTROS ABS: 0.9 10*3/uL — AB (ref 1.7–7.7)
NEUTROS PCT: 74 %
Neutro Abs: 1 10*3/uL — ABNORMAL LOW (ref 1.7–7.7)
Neutrophils Relative %: 70 %
Platelets: 51 10*3/uL — ABNORMAL LOW (ref 150–400)
Platelets: 55 10*3/uL — ABNORMAL LOW (ref 150–400)
RBC: 1.92 MIL/uL — AB (ref 4.22–5.81)
RBC: 2.98 MIL/uL — ABNORMAL LOW (ref 4.22–5.81)
RDW: 19.8 % — ABNORMAL HIGH (ref 11.5–15.5)
RDW: 20.6 % — ABNORMAL HIGH (ref 11.5–15.5)
WBC: 1.1 10*3/uL — AB (ref 4.0–10.5)
WBC: 1.5 10*3/uL — ABNORMAL LOW (ref 4.0–10.5)

## 2015-11-08 LAB — GLUCOSE, CAPILLARY
GLUCOSE-CAPILLARY: 118 mg/dL — AB (ref 65–99)
GLUCOSE-CAPILLARY: 118 mg/dL — AB (ref 65–99)
GLUCOSE-CAPILLARY: 120 mg/dL — AB (ref 65–99)
Glucose-Capillary: 78 mg/dL (ref 65–99)
Glucose-Capillary: 97 mg/dL (ref 65–99)

## 2015-11-08 LAB — I-STAT CHEM 8, ED
BUN: 57 mg/dL — AB (ref 6–20)
CALCIUM ION: 1.23 mmol/L (ref 1.15–1.40)
CHLORIDE: 101 mmol/L (ref 101–111)
Creatinine, Ser: 2.7 mg/dL — ABNORMAL HIGH (ref 0.61–1.24)
GLUCOSE: 152 mg/dL — AB (ref 65–99)
HCT: 22 % — ABNORMAL LOW (ref 39.0–52.0)
Hemoglobin: 7.5 g/dL — ABNORMAL LOW (ref 13.0–17.0)
POTASSIUM: 4.6 mmol/L (ref 3.5–5.1)
Sodium: 135 mmol/L (ref 135–145)
TCO2: 21 mmol/L (ref 0–100)

## 2015-11-08 LAB — TROPONIN I
Troponin I: 0.47 ng/mL (ref <0.03)
Troponin I: 0.68 ng/mL (ref <0.03)

## 2015-11-08 LAB — URINALYSIS, ROUTINE W REFLEX MICROSCOPIC
GLUCOSE, UA: NEGATIVE mg/dL
HGB URINE DIPSTICK: NEGATIVE
Ketones, ur: 15 mg/dL — AB
LEUKOCYTES UA: NEGATIVE
Nitrite: NEGATIVE
PH: 5 (ref 5.0–8.0)
PROTEIN: NEGATIVE mg/dL
SPECIFIC GRAVITY, URINE: 1.021 (ref 1.005–1.030)

## 2015-11-08 LAB — APTT: APTT: 28 s (ref 24–36)

## 2015-11-08 LAB — MAGNESIUM: Magnesium: 1.4 mg/dL — ABNORMAL LOW (ref 1.7–2.4)

## 2015-11-08 LAB — PREPARE RBC (CROSSMATCH)

## 2015-11-08 LAB — MRSA PCR SCREENING: MRSA by PCR: NEGATIVE

## 2015-11-08 LAB — I-STAT TROPONIN, ED: TROPONIN I, POC: 0.17 ng/mL — AB (ref 0.00–0.08)

## 2015-11-08 LAB — PROTIME-INR
INR: 1.17
PROTHROMBIN TIME: 14.9 s (ref 11.4–15.2)

## 2015-11-08 LAB — I-STAT CG4 LACTIC ACID, ED: LACTIC ACID, VENOUS: 1.67 mmol/L (ref 0.5–1.9)

## 2015-11-08 LAB — POC OCCULT BLOOD, ED: FECAL OCCULT BLD: NEGATIVE

## 2015-11-08 LAB — CORTISOL: Cortisol, Plasma: 65.5 ug/dL

## 2015-11-08 LAB — PHOSPHORUS: Phosphorus: 3.5 mg/dL (ref 2.5–4.6)

## 2015-11-08 MED ORDER — PHENYLEPHRINE HCL 10 MG/ML IJ SOLN
30.0000 ug/min | INTRAMUSCULAR | Status: DC
  Administered 2015-11-08: 30 ug/min via INTRAVENOUS
  Administered 2015-11-08 (×2): 40 ug/min via INTRAVENOUS
  Administered 2015-11-09: 60 ug/min via INTRAVENOUS
  Filled 2015-11-08 (×4): qty 1

## 2015-11-08 MED ORDER — NOREPINEPHRINE BITARTRATE 1 MG/ML IV SOLN
0.0000 ug/min | Freq: Once | INTRAVENOUS | Status: DC
  Filled 2015-11-08: qty 4

## 2015-11-08 MED ORDER — ONDANSETRON HCL 4 MG/2ML IJ SOLN
4.0000 mg | Freq: Once | INTRAMUSCULAR | Status: AC
  Administered 2015-11-08: 4 mg via INTRAVENOUS
  Filled 2015-11-08: qty 2

## 2015-11-08 MED ORDER — VANCOMYCIN HCL IN DEXTROSE 750-5 MG/150ML-% IV SOLN
750.0000 mg | INTRAVENOUS | Status: DC
  Administered 2015-11-10 – 2015-11-12 (×2): 750 mg via INTRAVENOUS
  Filled 2015-11-08 (×2): qty 150

## 2015-11-08 MED ORDER — FENTANYL CITRATE (PF) 100 MCG/2ML IJ SOLN
50.0000 ug | Freq: Once | INTRAMUSCULAR | Status: AC
  Administered 2015-11-08: 50 ug via INTRAVENOUS
  Filled 2015-11-08: qty 2

## 2015-11-08 MED ORDER — SODIUM CHLORIDE 0.9 % IV BOLUS (SEPSIS)
1000.0000 mL | Freq: Once | INTRAVENOUS | Status: AC
  Administered 2015-11-08: 1000 mL via INTRAVENOUS

## 2015-11-08 MED ORDER — SODIUM CHLORIDE 0.9 % IV BOLUS (SEPSIS): 500.0000 mL | Freq: Once | INTRAVENOUS | Status: DC

## 2015-11-08 MED ORDER — PIPERACILLIN-TAZOBACTAM 3.375 G IVPB 30 MIN
3.3750 g | Freq: Once | INTRAVENOUS | Status: AC
  Administered 2015-11-08: 3.375 g via INTRAVENOUS
  Filled 2015-11-08: qty 50

## 2015-11-08 MED ORDER — VANCOMYCIN HCL IN DEXTROSE 1-5 GM/200ML-% IV SOLN
1000.0000 mg | Freq: Once | INTRAVENOUS | Status: AC
  Administered 2015-11-08: 1000 mg via INTRAVENOUS
  Filled 2015-11-08: qty 200

## 2015-11-08 MED ORDER — PIPERACILLIN-TAZOBACTAM IN DEX 2-0.25 GM/50ML IV SOLN
2.2500 g | Freq: Four times a day (QID) | INTRAVENOUS | Status: DC
Start: 2015-11-08 — End: 2015-11-12
  Administered 2015-11-08 – 2015-11-12 (×16): 2.25 g via INTRAVENOUS
  Filled 2015-11-08 (×18): qty 50

## 2015-11-08 MED ORDER — TAMSULOSIN HCL 0.4 MG PO CAPS
0.4000 mg | ORAL_CAPSULE | Freq: Every day | ORAL | Status: DC
  Administered 2015-11-08 – 2015-11-16 (×9): 0.4 mg via ORAL
  Filled 2015-11-08 (×10): qty 1

## 2015-11-08 MED ORDER — SODIUM CHLORIDE 0.9 % IV SOLN: 250.0000 mL | INTRAVENOUS | Status: DC | PRN

## 2015-11-08 MED ORDER — SODIUM CHLORIDE 0.9 % IV SOLN: Freq: Once | INTRAVENOUS | Status: DC

## 2015-11-08 MED ORDER — PANTOPRAZOLE SODIUM 40 MG PO TBEC
40.0000 mg | DELAYED_RELEASE_TABLET | ORAL | Status: DC
  Administered 2015-11-08 – 2015-11-14 (×2): 40 mg via ORAL
  Filled 2015-11-08 (×6): qty 1

## 2015-11-08 MED ORDER — SODIUM CHLORIDE 0.9 % IV BOLUS (SEPSIS): 250.0000 mL | Freq: Once | INTRAVENOUS | Status: DC

## 2015-11-08 MED ORDER — SODIUM CHLORIDE 0.9 % IV SOLN
INTRAVENOUS | Status: DC
  Administered 2015-11-08: 03:00:00 via INTRAVENOUS
  Administered 2015-11-08: 125 mL/h via INTRAVENOUS
  Administered 2015-11-08: 09:00:00 via INTRAVENOUS
  Administered 2015-11-09: 125 mL/h via INTRAVENOUS
  Administered 2015-11-10 – 2015-11-14 (×6): via INTRAVENOUS

## 2015-11-08 MED ORDER — ACETAMINOPHEN 650 MG RE SUPP
650.0000 mg | Freq: Once | RECTAL | Status: AC
  Administered 2015-11-08: 650 mg via RECTAL
  Filled 2015-11-08: qty 1

## 2015-11-08 MED ORDER — LEVALBUTEROL HCL 0.63 MG/3ML IN NEBU
0.6300 mg | INHALATION_SOLUTION | RESPIRATORY_TRACT | Status: DC | PRN
  Administered 2015-11-12 – 2015-11-14 (×4): 0.63 mg via RESPIRATORY_TRACT
  Filled 2015-11-08 (×4): qty 3

## 2015-11-08 NOTE — Progress Notes (Signed)
CRITICAL VALUE ALERT  Critical value received:  Troponin 0.47  Date of notification:  11/08/15  Time of notification:  141  Critical value read back: yes  Nurse who received alert:  Marlow Baars  MD notified (1st page): Junius Roads.  Time of first page:  1420   MD notified (2nd page)E-link Dr. Halford Chessman  Time of second page 15:09  Responding MD:  Dr Halford Chessman  Time MD responded:  [phone]

## 2015-11-08 NOTE — Progress Notes (Signed)
Notified E-link of critical troponin of 0.47. MD aware. Will continue with current plan of care.

## 2015-11-08 NOTE — Consult Note (Signed)
Referring MD: self  PCP:  Mathews Argyle, MD   Reason for Referral: Provided clinical history for patient I treat with recently diagnosed multiple myeloma.   Chief Complaint  Patient presents with  . 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 Hemoglobin 10. Creatinine 1.3. Bone marrow with less than 5% plasma cells. In July of this year, he had a sudden fall in his hemoglobin from 10 down to 8 g, fall in his platelet count from baseline of 150-160,000 down to 100,000 and development of  leukopenia with white count 3400. He had increasing back pain. Creatinine rising from his baseline over the last year up to 1.7. No hypercalcemia. Normal albumin. Elevated beta 2 microglobulin. No lytic lesions on skeletal survey. Bone marrow biopsy done 10/08/2015 diagnostic for myeloma with 84% plasma cells. He was started on weekly dexamethasone 20 mg. He received a blood transfusion on August 14 when hemoglobin fell to 7.6. On August 24, Revlimid was added, age and renal dose adjusted, with initial plan for 10 mg 21 days on 7 days off. I saw him in follow-up on August 29. Hemoglobin was 8.6. White count 2200 with absolute neutrophils 1300, platelet count 72,000. Calcium was 10.5 with albumin 3.2. He was afebrile. Exam unchanged from baseline At that point he had only received 5 doses of the Revlimid. In view of the worsening renal failure, rise in calcium, and modest fall in his counts from baseline, I advised him to decrease his dose to 10 mg every 3 days. He has not had any additional Revlimid since the visit with me on the 29th. . He returned on August 30 and received 500 mL of normal saline and 3 mg of Zometa.  His daughter called me at 1 AM to report sudden onset of confusion, vomiting, generalized weakness and a fall. Increased respiratory rate and fever of  101. She was advised to call an ambulance and bring him to the hospital. On arrival in the emergency department temperature was 104. He was hypotensive with blood pressures as low as 78/51 but alert and mentating well. Of note he is hard of hearing. Lactic acid 1.67. Initial CBC spurious result. Repeated and hemoglobin 6.4, white count 1100 with 74% neutrophils ANC 0.9 and platelet count 51,000. He did have some initial urine output but it was not recorded. Oxygen saturation 100% on nasal cannula oxygen FiO2 not recorded.   Past Medical History:  Diagnosis Date  . Anemia   . CKD (chronic kidney disease), stage III 05/17/2014  . Coronary artery disease   . Degenerative joint disease   . DYSPNEA ON EXERTION 07/19/2008  . GERD (gastroesophageal reflux disease)   . Hiatal hernia   . Hypertension   . MGUS (monoclonal gammopathy of unknown significance) 01/31/2014   IgA kappa dx 07/22/10   . Monoclonal gammopathy   . Multiple myeloma ([place]) 10/11/2015   IGA kappa  84% plasma cells BM bx 10/08/15;  IgA 1,226 mg&; FLC ratio 24.25; kappa 402.6; Albumin 3.8, beta-2-miroglobulin 6.5; calcium 9.7; Hb 8.2; BUN 36; Cr 1.7; skeletal survey no lytic lesions 10/11/15  . Pancytopenia ([place]) 10/03/2015  . Peripheral neuropathy ([place])   . Prostate cancer ([place])   . pulmonary hypertension   . Pulmonary hypertension ([place])   . Spinal stenosis   . SVT (supraventricular tachycardia) ([place]) hospitalized 05/17/2014  . Type II diabetes mellitus ([place])   :  Past Surgical History:  Procedure Laterality Date  . CHOLECYSTECTOMY    . COLON SURGERY    . CORONARY ANGIOPLASTY    . JOINT REPLACEMENT    . LAMINECTOMY  x 4 last sx 1960's  . TOTAL HIP ARTHROPLASTY Right 11/18/2010  :   :  Allergies  Allergen Reactions  . Meperidine Hcl Other (See Comments)    REACTION: intol---causes nausea  . Insulins Other (See Comments)    No allergy but patient does NOT want any insulin therapy at this time. He discussed this with MD on  05/18/14  :  Family History  Problem Relation Age of Onset  . Heart attack Mother   . Heart attack Father   . Cancer Father   . Stroke Neg Hx   :  Current Facility-Administered Medications:  .  0.9 %  sodium chloride infusion, , Intravenous, Continuous, Kristen N Ward, DO, Last Rate: 125 mL/hr at 11/08/15 0309 .  0.9 %  sodium chloride infusion, , Intravenous, Once, Kristen N Ward, DO .  0.9 %  sodium chloride infusion, 250 mL, Intravenous, PRN, Javier Glazier, MD .  levalbuterol Penne Lash) nebulizer solution 0.63 mg, 0.63 mg, Nebulization, Q3H PRN, Corey Harold, NP .  pantoprazole (PROTONIX) EC tablet 40 mg, 40 mg, Oral, QODAY, Javier Glazier, MD .  Margrett Rud sodium chloride 0.9 % bolus 1,000 mL, 1,000 mL, Intravenous, Once, Stopped at 11/08/15 0518 **AND** sodium chloride 0.9 % bolus 500 mL, 500 mL, Intravenous, Once **AND** sodium chloride 0.9 % bolus 250 mL, 250 mL, Intravenous, Once, Kristen N Ward, DO .  tamsulosin (FLOMAX) capsule 0.4 mg, 0.4 mg, Oral, QPC supper, Javier Glazier, MD  Current Outpatient Prescriptions:  .  amoxicillin (AMOXIL) 500 MG capsule, Take 2,000 mg by mouth See admin instructions. Before dental procedures, Disp: , Rfl:  .  aspirin 81 MG tablet, Take 81 mg by mouth every other day. , Disp: , Rfl:  .  cholecalciferol (VITAMIN D) 1000 UNITS tablet, Take 1,000 Units by mouth daily., Disp: , Rfl:  .  Cyanocobalamin (VITAMIN B-12 IJ), Inject 1 mL as directed every 30 (thirty) days., Disp: , Rfl:  .  dexamethasone (DECADRON) 4 MG tablet, Take 5 tablets (20 mg total) by mouth once a week., Disp: 40 tablet, Rfl: 6 .  lenalidomide (REVLIMID) 10 MG capsule, Take one 10 mg tab every 3 days, Disp: 21 capsule, Rfl: 6 .  linagliptin (TRADJENTA) 5 MG TABS tablet, Take 5 mg by mouth daily., Disp: , Rfl:  .  metoprolol tartrate (LOPRESSOR) 25 MG tablet, Take 25-37.5 mg by mouth 2 (two) times daily. Take 37.$RemoveBefore'5mg'[id number]$  in the am and $Remo'25mg'naJZF$  in the evening, Disp: , Rfl:  .   omeprazole (PRILOSEC) 20 MG capsule, Take 20 mg by mouth every other day., Disp: , Rfl:  .  pyridOXINE (B-6) 50 MG tablet, Take 50 mg by mouth daily., Disp: , Rfl:  .  Tamsulosin HCl (FLOMAX) 0.4 MG CAPS, Take 0.4 mg by mouth daily. 30 min after the same meal, daily , Disp: , Rfl:  .  vitamin C (ASCORBIC ACID) 500 MG tablet, Take 500 mg by mouth daily.  , Disp: , Rfl:   Social History   Social History  . Marital status: Married, Wife with dementia in a nursing facility     Spouse name: N/A  . Number of children: 2 daughters and a son. Son is a Doctor, general practice here in town   . Years of education: M.D.  Occupational History  Retired Copywriter, advertising  Social History Main Topics  . Smoking status: Former Smoker    Types: Cigarettes    Quit date: 02/09/1974  . Smokeless tobacco: Never Used  . Alcohol use No  . Drug use: No  . Sexual activity: No   Other Topics Concern  . Not on file   Social History Narrative  . Currently lives at [facility] in [place]     ROS: Chronic back pain which has increased recently likely secondary to bone marrow expansion from myeloma. Multiple prior surgeries on his spine. Painful idiopathic peripheral neuropathy See HPI  Vitals: Vitals:   11/08/15 0549 11/08/15 0553  BP: (!) 78/51 (!) 78/51  Pulse: 110 114  Resp: 23 22  Temp:  100.1 F (37.8 C)  Blood pressure (!) 88/53, pulse 105, temperature 100.1 F (37.8 C), temperature source Axillary, resp. rate (!) 28, SpO2 100 %.   PHYSICAL EXAM: General appearance:Frail, chronically ill appearing Caucasian man  HEENT:Pharynx no erythema exudate or ulcer.  Lymph Nodes:No adenopathy on recent exam lymph node  exam not done this evening.  Resp: Lungs overall clear to auscultation  Cardio: Regular cardiac rhythm no murmur gallop or rub  Vascular:Extremities are warm and well perfused. No cyanosis.  Breasts: GI: Abdomen is soft and nontender no mass no  organomegaly  GU: Extremities:No edema, no calf tenderness  Neurologic: He is alert, awake, cranial nerves grossly normal except for decreased hearing which is chronic. Pupils equal round reactive to light. Full extraocular movements. No facial asymmetry. Tongue is midline. Motor strength is 5 over 5. Reflexes absent symmetric at the knees. 1+ symmetric at the biceps. Upper body coordination normal. Gait not tested  Skin:Congenital ichthyosis   Labs:   Recent Labs  11/08/15 0320 11/08/15 0330 11/08/15 0423  WBC QUESTIONABLE RESULTS, RECOMMEND RECOLLECT TO VERIFY  --  1.1*  HGB QUESTIONABLE RESULTS, RECOMMEND RECOLLECT TO VERIFY 7.5* 6.4*  HCT QUESTIONABLE RESULTS, RECOMMEND RECOLLECT TO VERIFY 22.0* 19.5*  PLT QUESTIONABLE RESULTS, RECOMMEND RECOLLECT TO VERIFY  --  51*    Recent Labs  11/08/15 0320 11/08/15 0330 11/08/15 0423  NA QUESTIONABLE RESULTS, RECOMMEND RECOLLECT TO VERIFY 135 136  K QUESTIONABLE RESULTS, RECOMMEND RECOLLECT TO VERIFY 4.6 3.9  CL QUESTIONABLE RESULTS, RECOMMEND RECOLLECT TO VERIFY 101 105  CO2 QUESTIONABLE RESULTS, RECOMMEND RECOLLECT TO VERIFY  --  19*  GLUCOSE QUESTIONABLE RESULTS, RECOMMEND RECOLLECT TO VERIFY 152* 150*  BUN QUESTIONABLE RESULTS, RECOMMEND RECOLLECT TO VERIFY 57* 48*  CREATININE QUESTIONABLE RESULTS, RECOMMEND RECOLLECT TO VERIFY 2.70* 2.55*  CALCIUM QUESTIONABLE RESULTS, RECOMMEND RECOLLECT TO VERIFY  --  9.0      Images Studies/Results:  Dg Chest 1 View  Result Date: 11/08/2015 CLINICAL DATA:  Generalized weakness.  Fever and nausea.  Sepsis. EXAM: CHEST 1 VIEW COMPARISON:  Chest radiograph 10/11/2015 FINDINGS: The cardiac silhouette is enlarged, likely exaggerated by AP technique. No focal airspace consolidation. No overt pulmonary edema. No pneumothorax or sizable pleural effusion. IMPRESSION: Cardiomegaly without pulmonary edema or focal consolidation. Electronically Signed   By: Ulyses Jarred M.D.   On: 11/08/2015 04:00    Dg Lumbar Spine Complete  Result Date: 11/08/2015 CLINICAL DATA:  Sepsis.  Generalized weakness. EXAM: LUMBAR SPINE - COMPLETE 4+ VIEW COMPARISON:  Lumbar spine radiograph 10/11/2015 FINDINGS: No evidence of fracture or acute subluxation. There is extensive lumbar degenerative disease including multilevel disc space narrowing and mild vertebral body height loss. Disc space narrowing is most severe at L5-S1. Multiple large anterior  and lateral osteophytes are noted. Grade 1 retrolisthesis at L3-L4. Multilevel facet arthrosis. There is an incompletely visualized right total hip arthroplasty. Cholecystectomy clips are noted. Multiple pelvic surgical clips are seen. IMPRESSION: No acute fracture or subluxation of the lumbar spine. Severe degenerative disc disease, greatest at L5-S1. Electronically Signed   By: Ulyses Jarred M.D.   On: 11/08/2015 04:04   Ct Head Wo Contrast  Result Date: 11/08/2015 CLINICAL DATA:  Increasing weakness EXAM: CT HEAD WITHOUT CONTRAST TECHNIQUE: Contiguous axial images were obtained from the base of the skull through the vertex without intravenous contrast. COMPARISON:  None. FINDINGS: Brain: There is no intracranial hemorrhage, mass or evidence of acute infarction. There is mild generalized atrophy. There is mild chronic microvascular ischemic change. There is no significant extra-axial fluid collection. No acute intracranial findings are evident. Vascular: No hyperdense vessel or unexpected calcification. Skull/Sinuses/Orbits: The calvarium and skullbase are intact. Moderate membrane thickening of the left sphenoid sinus, indeterminate chronicity. IMPRESSION: No acute intracranial findings. There is moderate generalized atrophy and chronic appearing white matter hypodensities which likely represent small vessel ischemic disease. Electronically Signed   By: Andreas Newport M.D.   On: 11/08/2015 04:20        Assessment: Neutropenic sepsis in an elderly man recently  started on treatment for IgG  kappamultiple myeloma  Recommendation: His medical problems have been well controlled up until now. He is mentating, oxygenating, no peripheral cyanosis, normal lactic acid despite hypotension. I think it is reasonable to give him full support in attempt to stabilize him. I have discussed his situation with the critical care physician and critical care physician assistant. Close monitoring in the intensive care unit over the first 24-48 hours is reasonable. Attempt to minimize placement of invasive catheters and use of pressor agents if possible. He will receive blood which showed help improve his blood pressure. He has already received about 3 L of saline. He received the generic combination of vancomycin and Zosyn per the sepsis protocol. He does not have an indwelling catheter. Organisms responsible for initial septic episodes in the neutropenic patient include penicillin sensitive staph and strep, and the gram-negative enterics Klebsiella, Escherichia coli, and Pseudomonas. A single third-generation cephalosporin is appropriate for initial coverage and vancomycin not needed.  I have discussed goals of care with his daughter and his physician son. Dr. [name] has indicated to them that he would not want extreme measures such as mechanical ventilation employed in the event of an irreversible deterioration in his condition. Short of that, I feel we should give him  full support for this treatable infection  realizing in view of his age and overall frailty, that his condition could worsen suddenly and unexpectedly and we may need to modify our initial recommendations.  Alyson Locket GRANFORTUNA 11/08/2015, 6:06 AM

## 2015-11-08 NOTE — ED Notes (Signed)
Admitting providers at bedside

## 2015-11-08 NOTE — Progress Notes (Signed)
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  Pharmacy has been consulted for Vancomycin/Zosn dosing. Pt recently diagnosed with multiple myeloma. Pt is hypotensive and has fever.  Plan: -Vancomycin 750 mg IV q48h -Zosyn 2.25g IV q6h -Trend labs, temp, renal function  -Drug levels as indicated   Temp (24hrs), Avg:99.9 F (37.7 C), Min:97.6 F (36.4 C), Max:104 F (40 C)   Recent Labs Lab 11/06/15 0855 11/08/15 0320 11/08/15 0328 11/08/15 0330 11/08/15 0423  WBC 2.2* QUESTIONABLE RESULTS, RECOMMEND RECOLLECT TO VERIFY  --   --  1.1*  CREATININE 2.17* QUESTIONABLE RESULTS, RECOMMEND RECOLLECT TO VERIFY  --  2.70* 2.55*  LATICACIDVEN  --   --  1.67  --   --     Estimated Creatinine Clearance: 15.1 mL/min (by C-G formula based on SCr of 2.55 mg/dL).    Allergies  Allergen Reactions  . Meperidine Hcl Other (See Comments)    REACTION: intol---causes nausea  . Insulins Other (See Comments)    No allergy but patient does NOT want any insulin therapy at this time. He discussed this with MD on 05/18/14    Narda Bonds 11/08/2015 6:37 AM

## 2015-11-08 NOTE — Progress Notes (Signed)
BP remains in low 80s. Neo drip increased. RN reports scant urine output which I do not see recorded. He remains alert, oriented. Now afebrile. He adamantly refuses a foley cath. I was able to convince him to at least use a condom cathPost transfusion Hb 9.3 Continue current management per critical care team.

## 2015-11-08 NOTE — ED Provider Notes (Signed)
By signing my name below, I, Emmanuella Mensah, attest that this documentation has been prepared under the direction and in the presence of [place], DO. Electronically Signed: Judithann Sauger, ED Scribe. 11/08/15. 3:33 AM.   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 Daughter reports associated fever, nausea, and multiple episodes of non-bloody vomiting. She adds that pt had an episode when pt fell over the toilet because he was so weak. No LOC. Pt reports ongoing moderate back pain that has been chronic for patient but daughter reports that she feels it is worse than normal. No alleviating factors noted. Pt has not tried any medications PTA. He received blood transfusion yesterday for pancytopenia, followed by Dr Beryle Beams. He denies a hx of PE/DVT. He also denies any acute cough, blood in stool, melena, hematemesis, chest pain, or SOB.  No abdominal pain. Daughter reports he is confused.  Daughter reports fever of 101.2 at home. Did not receive anything prior to arrival.  ROS: Level V caveat for altered mental status  PAST MEDICAL HISTORY/PAST SURGICAL HISTORY:  Past Medical History:  Diagnosis Date  . Anemia   . CKD (chronic kidney disease), stage III 05/17/2014  . Coronary artery disease   . Degenerative joint disease   . DYSPNEA ON EXERTION 07/19/2008  . GERD (gastroesophageal reflux disease)   . Hiatal hernia   . Hypertension   . MGUS (monoclonal gammopathy of unknown significance) 01/31/2014   IgA kappa dx 07/22/10   . Monoclonal gammopathy   . Multiple myeloma ([place]) 10/11/2015   IGA kappa  84% plasma cells  BM bx 10/08/15;  IgA 1,226 mg&; FLC ratio 24.25; kappa 402.6; Albumin 3.8, beta-2-miroglobulin 6.5; calcium 9.7; Hb 8.2; BUN 36; Cr 1.7; skeletal survey no lytic lesions 10/11/15  . Pancytopenia ([place]) 10/03/2015  . Peripheral neuropathy ([place])   . Prostate cancer ([place])   . pulmonary hypertension   . Pulmonary hypertension ([place])   . Spinal stenosis   . SVT (supraventricular tachycardia) ([place]) hospitalized 05/17/2014  . Type II diabetes mellitus ([facility])     MEDICATIONS:  Prior to Admission medications   Medication Sig Start Date End Date Taking? Authorizing Provider  aspirin 81 MG tablet Take 81 mg by mouth every other day.     Historical Provider, MD  cholecalciferol (VITAMIN D) 1000 UNITS tablet Take 1,000 Units by mouth daily.    Historical Provider, MD  Cyanocobalamin (VITAMIN B-12 IJ) Inject 1 mL as directed every 30 (thirty) days.    Historical Provider, MD  dexamethasone (DECADRON) 4 MG tablet Take 5 tablets (20 mg total) by mouth once a week. 10/11/15   Annia Belt, MD  lenalidomide (REVLIMID) 10 MG capsule Take one 10 mg tab every 3 days 11/06/15   Annia Belt, MD  linagliptin (TRADJENTA) 5 MG TABS tablet Take 5 mg by mouth daily.    Historical Provider, MD  metoprolol tartrate (LOPRESSOR) 25 MG tablet Take 25 mg by mouth every morning.    Historical Provider, MD  metoprolol tartrate (LOPRESSOR) 25 MG tablet Take 37.5 mg by mouth at bedtime.    Historical Provider, MD  pyridOXINE (B-6) 50  MG tablet Take 50 mg by mouth daily.    Historical Provider, MD  Tamsulosin HCl (FLOMAX) 0.4 MG CAPS Take 0.4 mg by mouth daily. 30 min after the same meal, daily     Historical Provider, MD  vitamin C (ASCORBIC ACID) 500 MG tablet Take 500 mg by mouth daily.      Historical Provider, MD    ALLERGIES:  Allergies  Allergen Reactions  . Meperidine Hcl Other (See Comments)    REACTION: intol---causes nausea  . Insulins Other (See Comments)    No allergy but patient does NOT want any insulin  therapy at this time. He discussed this with MD on 05/18/14    SOCIAL HISTORY:  Social History  Substance Use Topics  . Smoking status: Former Smoker    Types: Cigarettes    Quit date: 02/09/1974  . Smokeless tobacco: Never Used  . Alcohol use No    FAMILY HISTORY: Family History  Problem Relation Age of Onset  . Heart attack Mother   . Heart attack Father   . Stroke Neg Hx   . Cancer Father     EXAM: BP (!) 83/50   Pulse (!) 133   Temp 100.1 F (37.8 C) (Axillary)   Resp 18   SpO2 100% rectal temperature was initially 104 CONSTITUTIONAL: Elderly. Febrile, oriented to person only, nonsensical speech intermittently. Able to follow some commands. Pale. Chronically ill appearing. Very hard of hearing. HEAD: Normocephalic EYES: Conjunctivae clear, PERRL. Conjunctival pallor  ENT: normal nose; no rhinorrhea; dry mucous membranes NECK: Supple, no meningismus, no LAD, no nuchal rigidity  CARD: Irregularly irregular and tachycardiac; S1 and S2 appreciated; no murmurs, no clicks, no rubs, no gallops RESP: Normal chest excursion without splinting or tachypnea; breath sounds clear and equal bilaterally; no wheezes, no rhonchi, no rales, patient is hypoxic on room air, diminished at his bases bilaterally, respiratory distress, speaking full sentences ABD/GI: Normal bowel sounds; non-distended; soft, non-tender, no rebound, no guarding, no peritoneal signs RECTAL:  Normal rectal tone, no gross blood or melena, guaiac negative, no hemorrhoids appreciated, nontender rectal exam, no fecal impaction BACK:  The back appears normal and is mildly tender to palpation over the lumbar spine without step-off or deformity. there is no CVA tenderness EXT: Normal ROM in all joints; non-tender to palpation; no edema; normal capillary refill; no cyanosis, no calf tenderness or swelling    SKIN: Normal color for age and race; warm; no rash NEURO: Moves all extremities equally, No dysarthria or aphasia. No  obvious facial droop.    MEDICAL DECISION MAKING: Patient here with sepsis. He is febrile, tachycardic, hypotensive. He is confused. He denies any pain currently. Does have a history of multiple myeloma and recently had pancytopenia and had a transfusion yesterday. Will obtain labs, cultures, urine, chest x-ray. We'll obtain a head CT given altered mental status. We'll also obtain an x-ray of his back given daughter's complaint of worsening lower back pain. He appears to be moving his extremity is equally difficult to test full neuro exam given he is very confused. Daughter at this time is unaware if he has a DO NOT RESUSCITATE and would like to keep him a full code until she can talk to her brother. We'll give IV fluids, broad-spectrum antibiotics.  ED PROGRESS: Patient's labs show a hemoglobin of 3.1 which may be erroneous. I-STAT shows hemoglobin of 7.5. We'll repeat this blood work. Lactate 1.79. Troponin 0.17. Suspect this is from demand ischemia. Heart rate is improving with  IV hydration and after Tylenol. Blood pressure is improving with IV hydration as well. Patient has received vancomycin, Zosyn.  4:45 AM  D/w Dr. Ashby Dawes with CCM.  They will see patient in the emergency department for admission. Blood pressure was improving initially with IV hydration but now his MAPs are back in the 50s after 3 L IV fluid bolus. We'll start levophed.  Family is comfortable with this plan. Son reports the patient is a DO NOT RESUSCITATE/DO NOT INTUBATE.  Hemoglobin is 6.4. He is neutropenic and thrombocytopenic as well. He is guaiac negative and has no history of bleeding. He will receive 2 units of packed red blood cells. Chest x-ray shows no obvious infiltrate but he was hypoxic on arrival to the emergency department and clinically could have pneumonia. Urine shows no sign of infection. Blood cultures, urine culture pending. I have updated patient's son and daughter at bedside.   Patient to be admitted.  Family medicine resident team has also been updated with plan.      Sepsis - Repeat Assessment  Performed at:    4:38 AM  Vitals     Blood pressure (!) 95/52, pulse 114, temperature (!) 104 F (40 C), temperature source Rectal, resp. rate (!) 28, SpO2 100 %.  Heart:     Irregular rate and rhythm  Lungs:    diminished at bases  Capillary Refill:   <2 sec  Peripheral Pulse:   Radial pulse palpable  Skin:     Pale  CRITICAL CARE Performed by: Delice Bison Labrisha Wuellner, DO  Total critical care time: 45 minutes Critical care time was exclusive of separately billable procedures and treating other patients. Critical care was necessary to treat or prevent imminent or life-threatening deterioration. Critical care was time spent personally by me on the following activities: development of treatment plan with patient and/or surrogate as well as nursing, discussions with consultants, evaluation of patient's response to treatment, examination of patient, obtaining history from patient or surrogate, ordering and performing treatments and interventions, ordering and review of laboratory studies, ordering and review of radiographic studies, pulse oximetry and re-evaluation of patient's condition.     EKG Interpretation  Date/Time:  Thursday November 08 2015 02:29:50 EDT Ventricular Rate:  125 PR Interval:    QRS Duration: 136 QT Interval:  319 QTC Calculation: 460 R Axis:   -72 Text Interpretation:  Atrial fibrillation Ventricular bigeminy RBBB and LAFB Probable posterior infarct, acute ST elevation, consider inferior injury Lateral leads are also involved No significant change since last tracing other than rate is fast Confirmed by Tanequa Kretz,  DO, Fender Herder ([id number]) on 11/08/2015 2:37:55 AM Also confirmed by Leonides Schanz,  DO, Dartanion Teo [phone]), editor [place], Leda Gauze [phone])  on 11/08/2015 8:25:59 AM       I personally performed the services described in this documentation, which was scribed in my presence. The  recorded information has been reviewed and is accurate.    [place], DO 11/08/15 [phone]

## 2015-11-08 NOTE — ED Notes (Signed)
Per [name], after pt worked up call Teaching Service for admission.

## 2015-11-08 NOTE — ED Notes (Signed)
Per intesnsivist to hold levophed at this time

## 2015-11-08 NOTE — Care Management Note (Signed)
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              Action/Plan:  PTA pt was from [facility] [place] verified this with facility.  Son is primary support person.  Pt lives with his wife in an apartment in the facility.  CM will continue to follow for discharge needs   Expected Discharge Date:                  Expected Discharge Plan:     In-House Referral:     Discharge planning Services  CM Consult  Post Acute Care Choice:    Choice offered to:     DME Arranged:    DME Agency:     HH Arranged:    HH Agency:     Status of Service:  In process, will continue to follow  If discussed at Long Length of Stay Meetings, dates discussed:    Additional Comments:  Maryclare Labrador, RN 11/08/2015, 4:21 PM

## 2015-11-08 NOTE — ED Notes (Signed)
EDP at bedside  

## 2015-11-08 NOTE — H&P (Signed)
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), and COPD. For multiple myeloma he is currently taking dexamethasone weekly, Revlimid (decreased 8/29) and Zometa (stated 8/29 he also received blood transfusion this day). 8/31 Early AM he presented to [name] with complaints of progressive generalized weakness for several days which has now escalated to high fevers, nausea, and vomiting. Also complains of moderate back pain, which is chronic, but now worsening. Upon arrival to the ED he was febrile to 104, hypotensive, and hypoxemic with sats in the 70s on room air. He was started on broad spectrum antibiotics and administered IVF resuscitation. Hypotension was refractory to this. Laboratory evaluation significant for WBC 1.1, HGB 6.4, plt 51, SCr 2.55 (up from 2.17 2 days ago), lactic acid 1.67, troponin I 0.17 . PCCM called to evaluate in setting of shock.   PAST MEDICAL HISTORY :  He  has a past medical history of Anemia; CKD (chronic kidney disease), stage III (05/17/2014); Coronary artery disease; Degenerative joint disease; DYSPNEA ON EXERTION (07/19/2008); GERD (gastroesophageal reflux disease); Hiatal hernia; Hypertension; MGUS (monoclonal gammopathy of unknown significance) (01/31/2014); Monoclonal gammopathy; Multiple myeloma (HCC) (10/11/2015); Pancytopenia ([place]) (10/03/2015); Peripheral neuropathy ([name]); Prostate cancer [name]); pulmonary hypertension; Pulmonary hypertension ([place]); Spinal stenosis; SVT (supraventricular tachycardia) (HCC) (hospitalized 05/17/2014); and Type II diabetes mellitus ([name]).  PAST SURGICAL  HISTORY: He  has a past surgical history that includes Cholecystectomy; Colon surgery; Laminectomy (x 4 last sx 1960's); Coronary angioplasty; Total hip arthroplasty (Right, 11/18/2010); and Joint replacement.  Allergies  Allergen Reactions  . Meperidine Hcl Other (See Comments)    REACTION: intol---causes nausea  . Insulins Other (See Comments)    No allergy but patient does NOT want any insulin therapy at this time. He discussed this with MD on 05/18/14    No current facility-administered medications on file prior to encounter.    Current Outpatient Prescriptions on File Prior to Encounter  Medication Sig  . aspirin 81 MG tablet Take 81 mg by mouth every other day.   . cholecalciferol (VITAMIN D) 1000 UNITS tablet Take 1,000 Units by mouth daily.  . Cyanocobalamin (VITAMIN B-12 IJ) Inject 1 mL as directed every 30 (thirty) days.  Marland Kitchen dexamethasone (DECADRON) 4 MG tablet Take 5 tablets (20 mg total) by mouth once a week.  . lenalidomide (REVLIMID) 10 MG capsule Take one 10 mg tab every 3 days  . linagliptin (TRADJENTA) 5 MG TABS tablet Take 5 mg by mouth daily.  . metoprolol tartrate (LOPRESSOR) 25 MG tablet Take 25 mg by mouth every morning.  . metoprolol tartrate (LOPRESSOR) 25 MG tablet Take 37.5 mg by mouth at bedtime.  . pyridOXINE (B-6) 50 MG tablet Take 50 mg by mouth daily.  . Tamsulosin HCl (FLOMAX) 0.4 MG CAPS Take 0.4 mg by mouth daily. 30 min after the same meal, daily   . vitamin C (ASCORBIC ACID) 500 MG tablet Take 500 mg by mouth daily.      FAMILY HISTORY:  His indicated that his mother is deceased. He indicated that his father is deceased. He indicated that his maternal grandmother is deceased. He  indicated that his maternal grandfather is deceased. He indicated that his paternal grandmother is deceased. He indicated that his paternal grandfather is deceased. He indicated that the status of his neg hx is unknown.    SOCIAL HISTORY: He  reports that he quit smoking about  41 years ago. His smoking use included Cigarettes. He has never used smokeless tobacco. He reports that he does not drink alcohol or use drugs.  REVIEW OF SYSTEMS:   Bolds are positive  Constitutional: weight loss, gain, night sweats, Fevers, chills, fatigue .  HEENT: headaches, Sore throat, sneezing, nasal congestion, post nasal drip, Difficulty swallowing, Tooth/dental problems, visual complaints visual changes, ear ache CV:  chest pain, radiates: ,Orthopnea, PND, swelling in lower extremities, dizziness, palpitations, syncope.  GI  heartburn, indigestion, abdominal pain, nausea, vomiting, diarrhea, change in bowel habits, loss of appetite, bloody stools.  Resp: cough, productive: , hemoptysis, dyspnea, chest pain, pleuritic.  Skin: rash or itching or icterus GU: dysuria, change in color of urine, urgency or frequency. flank pain, hematuria  MS: back pain or swelling. decreased range of motion  Psych: change in mood or affect. depression or anxiety.  Neuro: difficulty with speech, weakness, numbness, ataxia    SUBJECTIVE:    VITAL SIGNS: BP (!) 95/52   Pulse 114   Temp (!) 104 F (40 C) (Rectal)   Resp (!) 28   SpO2 100%   HEMODYNAMICS:    VENTILATOR SETTINGS:    INTAKE / OUTPUT: No intake/output data recorded.  PHYSICAL EXAMINATION: General:  Frail cachectic elderly male in NAD Neuro:  Alert, oriented, somewhat confused. Extremely HOH HEENT:  [place]/AT, PERRL, no JVD. MM appear moist Cardiovascular:  IRIR, Tachy Lungs:  Clear bilateral breath souns Abdomen:  Soft, non-tender, non-distended Musculoskeletal:  No acute deformity or ROM limitation Skin:  Grossly intact  LABS:  BMET  Recent Labs Lab 11/06/15 0855 11/08/15 0320 11/08/15 0330 11/08/15 0423  NA 139 QUESTIONABLE RESULTS, RECOMMEND RECOLLECT TO VERIFY 135 136  K 4.6 QUESTIONABLE RESULTS, RECOMMEND RECOLLECT TO VERIFY 4.6 3.9  CL 104 QUESTIONABLE RESULTS, RECOMMEND RECOLLECT TO VERIFY 101 105  CO2 26  QUESTIONABLE RESULTS, RECOMMEND RECOLLECT TO VERIFY  --  19*  BUN 44* QUESTIONABLE RESULTS, RECOMMEND RECOLLECT TO VERIFY 57* 48*  CREATININE 2.17* QUESTIONABLE RESULTS, RECOMMEND RECOLLECT TO VERIFY 2.70* 2.55*  GLUCOSE 135* QUESTIONABLE RESULTS, RECOMMEND RECOLLECT TO VERIFY 152* 150*    Electrolytes  Recent Labs Lab 11/06/15 0855 11/08/15 0320 11/08/15 0423  CALCIUM 10.5* QUESTIONABLE RESULTS, RECOMMEND RECOLLECT TO VERIFY 9.0    CBC  Recent Labs Lab 11/06/15 0855 11/08/15 0320 11/08/15 0330 11/08/15 0423  WBC 2.2* QUESTIONABLE RESULTS, RECOMMEND RECOLLECT TO VERIFY  --  PENDING  HGB 8.6* QUESTIONABLE RESULTS, RECOMMEND RECOLLECT TO VERIFY 7.5* 6.4*  HCT 25.5* QUESTIONABLE RESULTS, RECOMMEND RECOLLECT TO VERIFY 22.0* 19.5*  PLT 72* QUESTIONABLE RESULTS, RECOMMEND RECOLLECT TO VERIFY  --  51*    Coag's  Recent Labs Lab 11/08/15 0320  APTT 28  INR 1.17    Sepsis Markers  Recent Labs Lab 11/08/15 0328  LATICACIDVEN 1.67    ABG No results for input(s): PHART, PCO2ART, PO2ART in the last 168 hours.  Liver Enzymes  Recent Labs Lab 11/06/15 0855 11/08/15 0320 11/08/15 0423  AST 18 QUESTIONABLE RESULTS, RECOMMEND RECOLLECT TO VERIFY 17  ALT 13* QUESTIONABLE RESULTS, RECOMMEND RECOLLECT TO VERIFY 11*  ALKPHOS 121 QUESTIONABLE RESULTS, RECOMMEND RECOLLECT TO VERIFY 82  BILITOT 2.6* QUESTIONABLE RESULTS, RECOMMEND RECOLLECT TO VERIFY 2.0*  ALBUMIN 3.2* QUESTIONABLE RESULTS,  RECOMMEND RECOLLECT TO VERIFY 2.5*    Cardiac Enzymes No results for input(s): TROPONINI, PROBNP in the last 168 hours.  Glucose No results for input(s): GLUCAP in the last 168 hours.  Imaging Dg Chest 1 View  Result Date: 11/08/2015 CLINICAL DATA:  Generalized weakness.  Fever and nausea.  Sepsis. EXAM: CHEST 1 VIEW COMPARISON:  Chest radiograph 10/11/2015 FINDINGS: The cardiac silhouette is enlarged, likely exaggerated by AP technique. No focal airspace consolidation. No overt  pulmonary edema. No pneumothorax or sizable pleural effusion. IMPRESSION: Cardiomegaly without pulmonary edema or focal consolidation. Electronically Signed   By: Ulyses Jarred M.D.   On: 11/08/2015 04:00   Dg Lumbar Spine Complete  Result Date: 11/08/2015 CLINICAL DATA:  Sepsis.  Generalized weakness. EXAM: LUMBAR SPINE - COMPLETE 4+ VIEW COMPARISON:  Lumbar spine radiograph 10/11/2015 FINDINGS: No evidence of fracture or acute subluxation. There is extensive lumbar degenerative disease including multilevel disc space narrowing and mild vertebral body height loss. Disc space narrowing is most severe at L5-S1. Multiple large anterior and lateral osteophytes are noted. Grade 1 retrolisthesis at L3-L4. Multilevel facet arthrosis. There is an incompletely visualized right total hip arthroplasty. Cholecystectomy clips are noted. Multiple pelvic surgical clips are seen. IMPRESSION: No acute fracture or subluxation of the lumbar spine. Severe degenerative disc disease, greatest at L5-S1. Electronically Signed   By: Ulyses Jarred M.D.   On: 11/08/2015 04:04   Ct Head Wo Contrast  Result Date: 11/08/2015 CLINICAL DATA:  Increasing weakness EXAM: CT HEAD WITHOUT CONTRAST TECHNIQUE: Contiguous axial images were obtained from the base of the skull through the vertex without intravenous contrast. COMPARISON:  None. FINDINGS: Brain: There is no intracranial hemorrhage, mass or evidence of acute infarction. There is mild generalized atrophy. There is mild chronic microvascular ischemic change. There is no significant extra-axial fluid collection. No acute intracranial findings are evident. Vascular: No hyperdense vessel or unexpected calcification. Skull/Sinuses/Orbits: The calvarium and skullbase are intact. Moderate membrane thickening of the left sphenoid sinus, indeterminate chronicity. IMPRESSION: No acute intracranial findings. There is moderate generalized atrophy and chronic appearing white matter hypodensities  which likely represent small vessel ischemic disease. Electronically Signed   By: Andreas Newport M.D.   On: 11/08/2015 04:20     STUDIES:  CT head 8/31 >No acute intracranial findings. There is moderate generalized atrophy and chronic appearing white matter hypodensities which likely represent small vessel ischemic disease.  CULTURES: Blood 8/31>> Urine 8/31 >>  ANTIBIOTICS: Zosyn 8/30 >>> Vancomycin 8/30 >>>  SIGNIFICANT EVENTS: 8/31 - Admit with neutropenic fever  LINES/TUBES: PIV   DISCUSSION: 82 year old male with new diagnosis of myeloma on several immunosuppressive therapies. Presented with high fevers and hypoxia 8/31 early AM. Treated with IVF, blood product, broad spectrum ABX. BP borderline, MAPs now low 60s. DNR. Unclear at this point whether patient ad family would support use of CVL/pressors. Plan to re-evaluate after transfusions completed. They seem open to conservative management.   ASSESSMENT / PLAN:  PULMONARY A: Hypoxemic respiratory failure COPD without acute exacerbation   P:   Supplemental O2 as needed to keep O2 sat > 92% DNI On no home bronchodilators PRN xopenex  CARDIOVASCULAR A:  Severe sepsis Atrial fibrillation with RVR Inferior ST changes on EKG, trop mildly bumped H/o HTN, SVT  P:  Telemetry monitoring MAP goal > 63mHg Gentle IVF resuscitation Blood product administration as below.  Lactic wnl Trend troponin, if rising/stable may need cards consult Will hope that blood and fluid will improve BP, family  will need to decide CVL/pressors or not. DNR  RENAL A:   Acute renal injury in the setting of sepsis/hypovolemia  P:   Gentle IVF hydration at this point (s/p 3L in ED) Strict I&O Monitor UOP Follow BMP  GASTROINTESTINAL A:   GERD Nausea/vomiting  P:   NPO Pepcid daily in setting renal failure PRN zofran (QTc 460)  HEMATOLOGIC A:   Multiple myeloma with significant marrow involvement Pancytopenia Anemia,  acute on chronic is setting of myeloma and likely hemodilution, no evidence of active bleeding.  P:  Transfuse 2 units PRBC per ED orders.  Repeat CBC at noon Hematology following  INFECTIOUS A:   Severe sepsis, etiology uncertain Immunocompromised host Neutropenic fever  P:   Broad ABX as above Follow cultures Fever control  ENDOCRINE A:   DM Relative AI?    P:   CBG monitoring q4hr with MD notification parameters Cortisol level Consider stress dose steroids  NEUROLOGIC A:   Acute metabolic encephalopathy Chronic back pain  P:   Monitor closely Very careful use of sedating medication, will opt to hold altogether   FAMILY  - Updates: Daughter updated in ED by PH/JN  - Inter-disciplinary family meet or Palliative Care meeting due by:  9/8 >   Georgann Housekeeper, AGACNP-BC [place] [facility]/Critical [facility] Pager [phone] or [phone]  11/08/2015 6:24 AM  PCCM Attending Note: Patient seen and examined with nurse practitioner. Please refer to his admit change pain which I reviewed. 82 year old male with history of multiple myeloma. On weekly Decadron and Revlimid. Patient presented with neutropenic fever and confusion leading to fall. At the time of my exam patient was oriented to person, place, year, and president. Daughter was at bedside. Patient did report some mild nausea but denies any abdominal pain, chest pain, or difficulty breathing. Currently undergoing transfusion of packed red blood cells for anemia. Mean arterial pressure borderline but seems to be stable. Patient mentating well at this point. Started on broad-spectrum antibiotic therapy with vancomycin and Zosyn. Continuing these antibiotics with pharmacy adjustments. Checking serum cortisol for possible adrenal insufficiency. Confirmed with patient and daughter at bedside that he is a full DO NOT RESUSCITATE/DO NOT INTUBATE. Hopefully we will be able to avoid vasopressor need/central line placement.  Patient's prognosis remains guarded given his multiple medical problems. Hopefully he will recover from his septic shock.  I have spent a total of 32 minutes of critical care time today caring for the patient, discussing plan of care with hematology, updating patient as well as daughter at bedside, and reviewing the patient's electronic medical record.  [name] Ashok Cordia, M.D. [facility] Pulmonary & Critical Care Pager:  [phone] After 3pm or if no response, call [phone] 6:33 AM 11/08/15

## 2015-11-08 NOTE — ED Triage Notes (Signed)
Py arrived via EMS. C/o increasing weakness with nausea and vomiting. Increasing weakness. Received blood transfusion  8/30. HX of multiple myeloma 6 weeks ago. Patient tachy. C/o back pain.

## 2015-11-09 DIAGNOSIS — C9000 Multiple myeloma not having achieved remission: Secondary | ICD-10-CM

## 2015-11-09 DIAGNOSIS — R509 Fever, unspecified: Secondary | ICD-10-CM

## 2015-11-09 LAB — CBC WITH DIFFERENTIAL/PLATELET
BAND NEUTROPHILS: 0 %
BAND NEUTROPHILS: 0 %
BASOS PCT: 0 %
BASOS PCT: 0 %
Basophils Absolute: 0 10*3/uL (ref 0.0–0.1)
Basophils Absolute: 0 10*3/uL (ref 0.0–0.1)
Blasts: 0 %
Blasts: 0 %
EOS PCT: 1 %
EOS PCT: 0 %
Eosinophils Absolute: 0 10*3/uL (ref 0.0–0.7)
Eosinophils Absolute: 0 10*3/uL (ref 0.0–0.7)
HCT: 31 % — ABNORMAL LOW (ref 39.0–52.0)
HCT: 26.8 % — ABNORMAL LOW (ref 39.0–52.0)
Hemoglobin: 10.3 g/dL — ABNORMAL LOW (ref 13.0–17.0)
Hemoglobin: 9 g/dL — ABNORMAL LOW (ref 13.0–17.0)
LYMPHS ABS: 0.3 10*3/uL — AB (ref 0.7–4.0)
LYMPHS ABS: 0.3 10*3/uL — AB (ref 0.7–4.0)
LYMPHS PCT: 24 %
LYMPHS PCT: 30 %
MCH: 31.7 pg (ref 26.0–34.0)
MCH: 31.7 pg (ref 26.0–34.0)
MCHC: 33.2 g/dL (ref 30.0–36.0)
MCHC: 33.6 g/dL (ref 30.0–36.0)
MCV: 95.4 fL (ref 78.0–100.0)
MCV: 94.4 fL (ref 78.0–100.0)
MONO ABS: 0 10*3/uL — AB (ref 0.1–1.0)
MONO ABS: 0 10*3/uL — AB (ref 0.1–1.0)
MYELOCYTES: 0 %
Metamyelocytes Relative: 0 %
Metamyelocytes Relative: 0 %
Monocytes Relative: 0 %
Monocytes Relative: 2 %
Myelocytes: 0 %
NEUTROS ABS: 0.6 10*3/uL — AB (ref 1.7–7.7)
NEUTROS PCT: 75 %
NEUTROS PCT: 68 %
NRBC: 0 /100{WBCs}
NRBC: 0 /100{WBCs}
Neutro Abs: 1 10*3/uL — ABNORMAL LOW (ref 1.7–7.7)
OTHER: 0 %
OTHER: 0 %
PLATELETS: 55 10*3/uL — AB (ref 150–400)
PLATELETS: 47 10*3/uL — AB (ref 150–400)
Promyelocytes Absolute: 0 %
Promyelocytes Absolute: 0 %
RBC: 3.25 MIL/uL — ABNORMAL LOW (ref 4.22–5.81)
RBC: 2.84 MIL/uL — ABNORMAL LOW (ref 4.22–5.81)
RDW: 20.6 % — AB (ref 11.5–15.5)
RDW: 21.1 % — AB (ref 11.5–15.5)
WBC Morphology: INCREASED
WBC: 1.3 10*3/uL — CL (ref 4.0–10.5)
WBC: 0.9 10*3/uL — CL (ref 4.0–10.5)

## 2015-11-09 LAB — GLUCOSE, CAPILLARY
GLUCOSE-CAPILLARY: 109 mg/dL — AB (ref 65–99)
GLUCOSE-CAPILLARY: 136 mg/dL — AB (ref 65–99)
GLUCOSE-CAPILLARY: 182 mg/dL — AB (ref 65–99)
GLUCOSE-CAPILLARY: 194 mg/dL — AB (ref 65–99)
Glucose-Capillary: 130 mg/dL — ABNORMAL HIGH (ref 65–99)
Glucose-Capillary: 106 mg/dL — ABNORMAL HIGH (ref 65–99)

## 2015-11-09 LAB — TYPE AND SCREEN
ABO/RH(D): AB POS
ANTIBODY SCREEN: NEGATIVE
UNIT DIVISION: 0
UNIT DIVISION: 0

## 2015-11-09 LAB — RENAL FUNCTION PANEL
ANION GAP: 11 (ref 5–15)
Albumin: 2.1 g/dL — ABNORMAL LOW (ref 3.5–5.0)
BUN: 38 mg/dL — AB (ref 6–20)
CHLORIDE: 107 mmol/L (ref 101–111)
CO2: 19 mmol/L — ABNORMAL LOW (ref 22–32)
Calcium: 8 mg/dL — ABNORMAL LOW (ref 8.9–10.3)
Creatinine, Ser: 2.43 mg/dL — ABNORMAL HIGH (ref 0.61–1.24)
GFR calc Af Amer: 26 mL/min — ABNORMAL LOW (ref >60)
GFR, EST NON AFRICAN AMERICAN: 22 mL/min — AB (ref >60)
Glucose, Bld: 108 mg/dL — ABNORMAL HIGH (ref 65–99)
POTASSIUM: 3.7 mmol/L (ref 3.5–5.1)
Phosphorus: 3.1 mg/dL (ref 2.5–4.6)
Sodium: 137 mmol/L (ref 135–145)

## 2015-11-09 LAB — MAGNESIUM: Magnesium: 1.4 mg/dL — ABNORMAL LOW (ref 1.7–2.4)

## 2015-11-09 LAB — BASIC METABOLIC PANEL
Anion gap: 7 (ref 5–15)
BUN: 36 mg/dL — AB (ref 6–20)
CHLORIDE: 108 mmol/L (ref 101–111)
CO2: 18 mmol/L — AB (ref 22–32)
CREATININE: 2.23 mg/dL — AB (ref 0.61–1.24)
Calcium: 7.2 mg/dL — ABNORMAL LOW (ref 8.9–10.3)
GFR calc non Af Amer: 24 mL/min — ABNORMAL LOW (ref >60)
GFR, EST AFRICAN AMERICAN: 28 mL/min — AB (ref >60)
Glucose, Bld: 176 mg/dL — ABNORMAL HIGH (ref 65–99)
POTASSIUM: 3.7 mmol/L (ref 3.5–5.1)
Sodium: 133 mmol/L — ABNORMAL LOW (ref 135–145)

## 2015-11-09 LAB — URINE CULTURE: CULTURE: NO GROWTH

## 2015-11-09 LAB — PATHOLOGIST SMEAR REVIEW

## 2015-11-09 LAB — TROPONIN I: Troponin I: 0.68 ng/mL (ref <0.03)

## 2015-11-09 MED ORDER — DEXAMETHASONE 4 MG PO TABS
20.0000 mg | ORAL_TABLET | ORAL | Status: DC
  Administered 2015-11-12: 20 mg via ORAL
  Filled 2015-11-09 (×3): qty 5

## 2015-11-09 MED ORDER — HYDROCORTISONE NA SUCCINATE PF 100 MG IJ SOLR: 50.0000 mg | Freq: Four times a day (QID) | INTRAMUSCULAR | Status: DC

## 2015-11-09 MED ORDER — MAGNESIUM SULFATE 2 GM/50ML IV SOLN
2.0000 g | Freq: Once | INTRAVENOUS | Status: AC
  Administered 2015-11-09: 2 g via INTRAVENOUS
  Filled 2015-11-09: qty 50

## 2015-11-09 MED ORDER — LINAGLIPTIN 5 MG PO TABS
5.0000 mg | ORAL_TABLET | Freq: Every day | ORAL | Status: DC
  Administered 2015-11-10 – 2015-11-16 (×7): 5 mg via ORAL
  Filled 2015-11-09 (×8): qty 1

## 2015-11-09 MED ORDER — HYDROCORTISONE NA SUCCINATE PF 100 MG IJ SOLR
50.0000 mg | Freq: Four times a day (QID) | INTRAMUSCULAR | Status: DC
Start: 2015-11-09 — End: 2015-11-10
  Administered 2015-11-09 – 2015-11-10 (×5): 50 mg via INTRAVENOUS
  Filled 2015-11-09 (×2): qty 1
  Filled 2015-11-09: qty 2
  Filled 2015-11-09: qty 1
  Filled 2015-11-09: qty 2
  Filled 2015-11-09: qty 1

## 2015-11-09 NOTE — Progress Notes (Addendum)
CRITICAL VALUE ALERT  Critical value received:  WBC 0.9  Date of notification:  11/09/2015  Time of notification:  0420  Critical value read back:Yes.    Nurse who received alert:  Jannifer Franklin  MD notified (1st page):    Time of first page:    MD notified (2nd page):  Time of second page:  Responding MD:  Dr. Jimmy Footman    Time MD responded:  [phone]

## 2015-11-09 NOTE — H&P (Addendum)
PULMONARY / CRITICAL CARE MEDICINE   Name: Paul TUCKERMAN, MD MRN: [id number] DOB: 25-Feb-1925    ADMISSION DATE:  11/08/2015 CONSULTATION DATE:  8/31  REFERRING MD:  Dr. 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), and COPD. For multiple myeloma he is currently taking dexamethasone weekly, Revlimid (decreased 8/29) and Zometa (stated 8/29 he also received blood transfusion this day). 8/31 Early AM he presented to [facility] Emergency Department with complaints of progressive generalized weakness for several days which has now escalated to high fevers, nausea, and vomiting. Also complains of moderate back pain, which is chronic, but now worsening. Upon arrival to the ED he was febrile to 104, hypotensive, and hypoxemic with sats in the 70s on room air. He was started on broad spectrum antibiotics and administered IVF resuscitation. Hypotension was refractory to this. Laboratory evaluation significant for WBC 1.1, HGB 6.4, plt 51, SCr 2.55 (up from 2.17 2 days ago), lactic acid 1.67, troponin I 0.17 . PCCM called to evaluate in setting of shock.     SUBJECTIVE: Improved with current interventions.   VITAL SIGNS: BP (!) 112/59   Pulse 81   Temp 97 F (36.1 C) (Axillary)   Resp (!) 23   Wt 133 lb 9.6 oz (60.6 kg)   SpO2 100%   BMI 20.92 kg/m   HEMODYNAMICS:    VENTILATOR SETTINGS:    INTAKE / OUTPUT: I/O last 3 completed shifts: In: 4573.3 [P.O.:180; I.V.:3523.3; Blood:670; IV Piggyback:200] Out: 1025 [Urine:1025]  PHYSICAL EXAMINATION: General:  Frail cachectic elderly male in NAD Neuro:  Alert, oriented, somewhat confused. Extremely HOH HEENT:  [place]/AT, PERRL, no JVD. MM appear moist Cardiovascular:  IRIR, Tachy Lungs:  Clear bilateral breath sounds, decreased in bases Abdomen:   Soft, non-tender, non-distended Musculoskeletal:  No acute deformity or ROM limitation Skin:  Grossly intact, l hand swollen from iv infiltration with neo.  LABS:  BMET  Recent Labs Lab 11/08/15 0320 11/08/15 0330 11/08/15 0423 11/09/15 0208  NA QUESTIONABLE RESULTS, RECOMMEND RECOLLECT TO VERIFY 135 136 137  K QUESTIONABLE RESULTS, RECOMMEND RECOLLECT TO VERIFY 4.6 3.9 3.7  CL QUESTIONABLE RESULTS, RECOMMEND RECOLLECT TO VERIFY 101 105 107  CO2 QUESTIONABLE RESULTS, RECOMMEND RECOLLECT TO VERIFY  --  19* 19*  BUN QUESTIONABLE RESULTS, RECOMMEND RECOLLECT TO VERIFY 57* 48* 38*  CREATININE QUESTIONABLE RESULTS, RECOMMEND RECOLLECT TO VERIFY 2.70* 2.55* 2.43*  GLUCOSE QUESTIONABLE RESULTS, RECOMMEND RECOLLECT TO VERIFY 152* 150* 108*    Electrolytes  Recent Labs Lab 11/08/15 0320 11/08/15 0423 11/09/15 0208  CALCIUM QUESTIONABLE RESULTS, RECOMMEND RECOLLECT TO VERIFY 9.0 8.0*  MG  --  1.4* 1.4*  PHOS  --  3.5 3.1    CBC  Recent Labs Lab 11/08/15 1259 11/08/15 1505 11/09/15 0208  WBC 1.3* 1.5* 0.9*  HGB 10.3* 9.3* 9.0*  HCT 31.0* 28.4* 26.8*  PLT 55* 55* 47*    Coag's  Recent Labs Lab 11/08/15 0320  APTT 28  INR 1.17    Sepsis Markers  Recent Labs Lab 11/08/15 0328  LATICACIDVEN 1.67    ABG No results for input(s): PHART, PCO2ART, PO2ART in the last 168 hours.  Liver Enzymes  Recent Labs Lab 11/06/15 0855 11/08/15 0320 11/08/15 0423 11/09/15 0208  AST 18 QUESTIONABLE RESULTS, RECOMMEND RECOLLECT TO VERIFY 17  --  ALT 13* QUESTIONABLE RESULTS, RECOMMEND RECOLLECT TO VERIFY 11*  --   ALKPHOS 121 QUESTIONABLE RESULTS, RECOMMEND RECOLLECT TO VERIFY 82  --   BILITOT 2.6* QUESTIONABLE RESULTS, RECOMMEND RECOLLECT TO VERIFY 2.0*  --   ALBUMIN 3.2* QUESTIONABLE RESULTS, RECOMMEND RECOLLECT TO VERIFY 2.5* 2.1*    Cardiac Enzymes  Recent Labs Lab 11/08/15 1259 11/08/15 2039 11/09/15 0109  TROPONINI 0.47* 0.68* 0.68*    Glucose  Recent  Labs Lab 11/08/15 1146 11/08/15 1547 11/08/15 1934 11/08/15 2348 11/09/15 0327 11/09/15 0751  GLUCAP 118* 118* 120* 78 130* 109*    Imaging No results found.   STUDIES:  CT head 8/31 >No acute intracranial findings. There is moderate generalized atrophy and chronic appearing white matter hypodensities which likely represent small vessel ischemic disease.  CULTURES: Blood 8/31>> Urine 8/31 >>  ANTIBIOTICS: Zosyn 8/30 >>> Vancomycin 8/30 >>>  SIGNIFICANT EVENTS: 8/31 - Admit with neutropenic fever  LINES/TUBES: PIV   DISCUSSION: 82 year old male with new diagnosis of myeloma on several immunosuppressive therapies. Presented with high fevers and hypoxia 8/31 early AM. Treated with IVF, blood product, broad spectrum ABX. BP borderline, MAPs now low 60s. DNR. Unclear at this point whether patient ad family would support use of CVL/pressors. Plan to re-evaluate after transfusions completed. They seem open to conservative management.   ASSESSMENT / PLAN:  PULMONARY A: Hypoxemic respiratory failure COPD without acute exacerbation   P:   Supplemental O2 as needed to keep O2 sat > 92% DNI On no home bronchodilators PRN xopenex  CARDIOVASCULAR A:  Severe sepsis better with blood/fluids/abx Atrial fibrillation with RVR Inferior ST changes on EKG, trop mildly bumped H/o HTN, SVT  P:  Telemetry monitoring MAP goal > 33mHg Gentle IVF resuscitation with NS ay 125. Follow creatine/K+ bmet  9/1 at 1800 Blood product administration as below.  Lactic wnl, recheck 9/2 Trend troponin, if rising/stable may need cards consult Will hope that blood and fluid will improve BP, family will need to decide CVL/pressors or not. DNR PIV neo which infiltrated left hand  RENAL Lab Results  Component Value Date   CREATININE 2.43 (H) 11/09/2015   CREATININE 2.55 (H) 11/08/2015   CREATININE 2.70 (H) 11/08/2015   CREATININE 1.9 (H) 02/06/2014    A:   Acute renal injury in the  setting of sepsis/hypovolemia  P:   Gentle IVF hydration at this point (s/p 3L in ED) Strict I&O Monitor UOP Follow BMP  GASTROINTESTINAL A:   GERD Nausea/vomiting  P:   NPO Pepcid daily in setting renal failure PRN zofran (QTc 460)  HEMATOLOGIC A:   Multiple myeloma with significant marrow involvement Pancytopenia Anemia, acute on chronic is setting of myeloma and likely hemodilution, no evidence of active bleeding.  P:  Transfuse 2 units PRBC per ED orders.  Repeat CBC at noon Hematology following and input appreciated  INFECTIOUS A:   Severe sepsis, etiology uncertain Immunocompromised host Neutropenic fever  P:   Broad ABX as above Follow cultures Fever control  ENDOCRINE CBG (last 3)   Recent Labs  11/08/15 2348 11/09/15 0327 11/09/15 0751  GLUCAP 78 130* 109*     A:   DM Relative AI? Cortisol 65    P:   CBG monitoring q4hr with MD notification parameters Cortisol level Consider stress dose steroids  NEUROLOGIC A:   Acute metabolic encephalopathy Chronic back pain  P:   Monitor closely Very careful use of sedating medication, will opt to hold altogether   FAMILY  - Updates:  Daughter updated in ED by PH/JN. 9/1 no family at bedside.  - Inter-disciplinary family meet or Palliative Care meeting due by:  9/8 >   Richardson Landry Minor ACNP Maryanna Shape PCCM Pager [phone] till 3 pm If no answer page [phone] 11/09/2015, 10:18 AM   STAFF NOTE: I, Merrie Roof, MD FACP have personally reviewed patient's available data, including medical history, events of note, physical examination and test results as part of my evaluation. I have discussed with resident/NP and other care providers such as pharmacist, RN and RRT. In addition, I personally evaluated patient and elicited key findings of: awake, lethargic, fc, lungs reduced, abdo soft, no rash, UA not reliable with neutropenia, await urine culture, Blood cultures to follow, so source is not clear  at this time, Gi possible as bacterial translocation?, continued zosyn, this is neutropenic septic shock, re add vanc for enterococcus coverage and staph for now as he is in shock, MAp goal 60, could consider neupogen, will leave this to heme to assess needs, atn noted, continued pos balance but limit over 1-2 liters today, follow ANC,mag supp needed, Tx plat if bleeding or less 10 k, cbc in am, cortisol high on decadron - likely accurate, would have just considered stress roids without cortisol level The patient is critically ill with multiple organ systems failure and requires high complexity decision making for assessment and support, frequent evaluation and titration of therapies, application of advanced monitoring technologies and extensive interpretation of multiple databases.   Critical Care Time devoted to patient care services described in this note is 30 Minutes. This time reflects time of care of this signee: Merrie Roof, MD FACP. This critical care time does not reflect procedure time, or teaching time or supervisory time of PA/NP/Med student/Med Resident etc but could involve care discussion time. Rest per NP/medical resident whose note is outlined above and that I agree with   Lavon Paganini. Titus Mould, MD, [place] Pgr: [place] [facility] 11/09/2015 10:35 AM

## 2015-11-09 NOTE — Progress Notes (Signed)
Hematology: Blood pressure has stabilized. He is off Neo-Synephrine. Unfortunately, the neo-infiltrated into his left forearm. He is making urine. Temp spike 101.4 during the night. Not unexpected. Peak temp on admission 104. It will take a few days for antibiotics to control the infection. Further decrease in his white count with total WBC 0.9. Neutrophils 0.6. Minor further decrease in platelets at 47,000. Hemoglobin holding after transfusion yesterday at 9 g. Renal function has also leveled out with creatinine 2.4 Impression: #1. Neutropenic sepsis Globally signs of stabilization over the last 24 hours. Anticipate he will have intermittent fevers for the first 72-96 hours. No reason to change antibiotics. Main neutropenic precaution is strict handwashing. Gloves, gowns, masks not needed. There is no contraindication to using Tylenol for fever. He only got a total of 5 doses of Revlimid. This is not a very myelosuppressive drug. Some of his pancytopenia relates to bone marrow suppression and consumption from sepsis. In addition, he has heavy bone marrow involvement with his myeloma. I anticipate blood counts should recover within a week. I would continue to monitor daily CBCs.  #2. IgG kappa Multiple myeloma Just diagnosed in July. I will continue his weekly Decadron since it is partially active against myeloma and not myelosuppressive. Current dose is 20 mg weekly. I will hold Revlimid until blood counts recover. Resume on an every 3 day dosing schedule.  I will be out of town for the next 2 weeks. Dr. Julieanne Manson has kindly agreed to cover me for hematology oncology concerns.

## 2015-11-09 NOTE — Progress Notes (Signed)
Dr. Jimmy Footman made aware of pt's neutropenic fever, resultant increased HR with no new orders.  Neutropenic precautions in place.

## 2015-11-10 LAB — GLUCOSE, CAPILLARY
GLUCOSE-CAPILLARY: 142 mg/dL — AB (ref 65–99)
GLUCOSE-CAPILLARY: 165 mg/dL — AB (ref 65–99)
GLUCOSE-CAPILLARY: 194 mg/dL — AB (ref 65–99)
Glucose-Capillary: 183 mg/dL — ABNORMAL HIGH (ref 65–99)
Glucose-Capillary: 208 mg/dL — ABNORMAL HIGH (ref 65–99)

## 2015-11-10 LAB — CBC WITH DIFFERENTIAL/PLATELET
BASOS PCT: 2 %
Basophils Absolute: 0 10*3/uL (ref 0.0–0.1)
EOS ABS: 0 10*3/uL (ref 0.0–0.7)
EOS PCT: 2 %
HCT: 27.6 % — ABNORMAL LOW (ref 39.0–52.0)
HEMOGLOBIN: 9 g/dL — AB (ref 13.0–17.0)
LYMPHS PCT: 34 %
Lymphs Abs: 0.3 10*3/uL — ABNORMAL LOW (ref 0.7–4.0)
MCH: 31.6 pg (ref 26.0–34.0)
MCHC: 32.6 g/dL (ref 30.0–36.0)
MCV: 96.8 fL (ref 78.0–100.0)
MONO ABS: 0.1 10*3/uL (ref 0.1–1.0)
Monocytes Relative: 9 %
NEUTROS ABS: 0.6 10*3/uL — AB (ref 1.7–7.7)
NEUTROS PCT: 53 %
Platelets: 36 10*3/uL — ABNORMAL LOW (ref 150–400)
RBC: 2.85 MIL/uL — ABNORMAL LOW (ref 4.22–5.81)
RDW: 20.9 % — ABNORMAL HIGH (ref 11.5–15.5)
WBC: 1 10*3/uL — CL (ref 4.0–10.5)

## 2015-11-10 LAB — BASIC METABOLIC PANEL
Anion gap: 8 (ref 5–15)
BUN: 38 mg/dL — AB (ref 6–20)
CHLORIDE: 113 mmol/L — AB (ref 101–111)
CO2: 16 mmol/L — AB (ref 22–32)
CREATININE: 2.21 mg/dL — AB (ref 0.61–1.24)
Calcium: 7.5 mg/dL — ABNORMAL LOW (ref 8.9–10.3)
GFR calc Af Amer: 29 mL/min — ABNORMAL LOW (ref >60)
GFR calc non Af Amer: 25 mL/min — ABNORMAL LOW (ref >60)
GLUCOSE: 168 mg/dL — AB (ref 65–99)
POTASSIUM: 4 mmol/L (ref 3.5–5.1)
Sodium: 137 mmol/L (ref 135–145)

## 2015-11-10 LAB — LACTIC ACID, PLASMA: Lactic Acid, Venous: 1.5 mmol/L (ref 0.5–1.9)

## 2015-11-10 LAB — PHOSPHORUS: Phosphorus: 4.5 mg/dL (ref 2.5–4.6)

## 2015-11-10 LAB — MAGNESIUM: Magnesium: 2.1 mg/dL (ref 1.7–2.4)

## 2015-11-10 MED ORDER — METOPROLOL TARTRATE 12.5 MG HALF TABLET
12.5000 mg | ORAL_TABLET | Freq: Two times a day (BID) | ORAL | Status: DC
  Administered 2015-11-10 – 2015-11-11 (×2): 12.5 mg via ORAL
  Filled 2015-11-10 (×3): qty 1

## 2015-11-10 MED ORDER — GLUCERNA SHAKE PO LIQD
237.0000 mL | Freq: Three times a day (TID) | ORAL | Status: DC
  Administered 2015-11-10 – 2015-11-15 (×10): 237 mL via ORAL

## 2015-11-10 MED ORDER — METOPROLOL TARTRATE 12.5 MG HALF TABLET
12.5000 mg | ORAL_TABLET | Freq: Two times a day (BID) | ORAL | Status: DC
  Filled 2015-11-10: qty 1

## 2015-11-10 MED ORDER — ZOLEDRONIC ACID 4 MG/5ML IV CONC: 3.0000 mg | INTRAVENOUS | Status: DC

## 2015-11-10 NOTE — Progress Notes (Signed)
RD contacted by RN regarding addition of supplements due to poor PO intake. RN stated pt received a Boost Breeze and drank all of it.   RD spoke with pt. He states "my daughter will be in soon" when asked about his poor po intake. He said he would like supplements, but they "have to be sugar free". Given very poor PO intake. Glucerna TID.   Burtis Junes RD, LDN, CNSC Clinical Nutrition Pager: [phone] 11/10/2015 9:17 AM

## 2015-11-10 NOTE — Progress Notes (Signed)
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  Pharmacy has been consulted for Vancomycin/Zosn dosing. Pt recently diagnosed with multiple myeloma.   Plan: -Vancomycin '750mg'$  IV q48h. Goal trough 15-24mg/mL -Plan to check vancomycin random level before dose due on 9/4 -Zosyn 2.25g IV q6h -Trend labs, temp, renal function, LOT  Temp (24hrs), Avg:98 F (36.7 C), Min:97.1 F (36.2 C), Max:99.2 F (37.3 C)   Recent Labs Lab 11/08/15 0328 11/08/15 0330 11/08/15 0423 11/08/15 1259 11/08/15 1505 11/09/15 0208 11/09/15 1950 11/10/15 0212 11/10/15 0620  WBC  --   --  1.1* 1.3* 1.5* 0.9*  --  1.0*  --   CREATININE  --  2.70* 2.55*  --   --  2.43* 2.23* 2.21*  --   LATICACIDVEN 1.67  --   --   --   --   --   --   --  1.5    Estimated Creatinine Clearance: 19.5 mL/min (by C-G formula based on SCr of 2.21 mg/dL).    Allergies  Allergen Reactions  . Meperidine Hcl Other (See Comments)    REACTION: intol---causes nausea  . Insulins Other (See Comments)    No allergy but patient does NOT want any insulin therapy at this time. He discussed this with MD on 05/18/14   Antibiotics this admission: Vanc 8/31 >>  Zosyn 8/31 >>  Cultures: 8/31 BCx: ngtd 8/31: urine: neg 8/31 MRSA PCR: neg   Mare Ludtke D. Chet Greenley, PharmD, BCPS Clinical Pharmacist Pager: [phone]/04/2015 10:03 AM

## 2015-11-10 NOTE — Progress Notes (Signed)
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), and COPD. For multiple myeloma he is currently taking dexamethasone weekly, Revlimid (decreased 8/29) and Zometa (stated 8/29 he also received blood transfusion this day). 8/31 Early AM he presented to [facility] Emergency Department with complaints of progressive generalized weakness for several days which has now escalated to high fevers, nausea, and vomiting. Also complains of moderate back pain, which is chronic, but now worsening. Upon arrival to the ED he was febrile to 104, hypotensive, and hypoxemic with sats in the 70s on room air. He was started on broad spectrum antibiotics and administered IVF resuscitation. Hypotension was refractory to this. Laboratory evaluation significant for WBC 1.1, HGB 6.4, plt 51, SCr 2.55 (up from 2.17 2 days ago), lactic acid 1.67, troponin I 0.17 . PCCM called to evaluate in setting of shock.     SUBJECTIVE: Feels well.   VITAL SIGNS: BP 93/67   Pulse 91   Temp 97.5 F (36.4 C) (Axillary)   Resp 16   Wt 133 lb 13.1 oz (60.7 kg)   SpO2 100%   BMI 20.96 kg/m   HEMODYNAMICS:    VENTILATOR SETTINGS:    INTAKE / OUTPUT: I/O last 3 completed shifts: In: 6048.3 [P.O.:780; I.V.:4768.3; IV Piggyback:500] Out: 2035 [Urine:2035]  PHYSICAL EXAMINATION: General:  Frail cachectic elderly male in no distress Neuro:  Alert, oriented, No focal deficits HEENT:  Moist mucus membranes, PERRL, no JVD. Cardiovascular: Irregular. NO MRG Lungs:  Clear bilateral breath sounds,  Abdomen:  Soft, non-tender, non-distended Musculoskeletal:  No acute  deformity or ROM limitation Skin:  Intact  LABS:  BMET  Recent Labs Lab 11/09/15 0208 11/09/15 1950 11/10/15 0212  NA 137 133* 137  K 3.7 3.7 4.0  CL 107 108 113*  CO2 19* 18* 16*  BUN 38* 36* 38*  CREATININE 2.43* 2.23* 2.21*  GLUCOSE 108* 176* 168*    Electrolytes  Recent Labs Lab 11/08/15 0423 11/09/15 0208 11/09/15 1950 11/10/15 0212  CALCIUM 9.0 8.0* 7.2* 7.5*  MG 1.4* 1.4*  --  2.1  PHOS 3.5 3.1  --  4.5    CBC  Recent Labs Lab 11/08/15 1505 11/09/15 0208 11/10/15 0212  WBC 1.5* 0.9* 1.0*  HGB 9.3* 9.0* 9.0*  HCT 28.4* 26.8* 27.6*  PLT 55* 47* 36*    Coag's  Recent Labs Lab 11/08/15 0320  APTT 28  INR 1.17    Sepsis Markers  Recent Labs Lab 11/08/15 0328 11/10/15 0620  LATICACIDVEN 1.67 1.5    ABG No results for input(s): PHART, PCO2ART, PO2ART in the last 168 hours.  Liver Enzymes  Recent Labs Lab 11/06/15 0855 11/08/15 0320 11/08/15 0423 11/09/15 0208  AST 18 QUESTIONABLE RESULTS, RECOMMEND RECOLLECT TO VERIFY 17  --   ALT 13* QUESTIONABLE RESULTS, RECOMMEND RECOLLECT TO VERIFY 11*  --   ALKPHOS 121 QUESTIONABLE RESULTS, RECOMMEND RECOLLECT TO VERIFY 82  --   BILITOT 2.6* QUESTIONABLE RESULTS, RECOMMEND RECOLLECT TO VERIFY 2.0*  --   ALBUMIN 3.2* QUESTIONABLE RESULTS, RECOMMEND RECOLLECT TO VERIFY 2.5* 2.1*    Cardiac Enzymes  Recent Labs Lab 11/08/15 1259  11/08/15 2039 11/09/15 0109  TROPONINI 0.47* 0.68* 0.68*    Glucose  Recent Labs Lab 11/09/15 0751 11/09/15 1146 11/09/15 1550 11/09/15 2002 11/09/15 2329 11/10/15 0358  GLUCAP 109* 106* 136* 182* 194* 183*    Imaging No results found.   STUDIES:  CT head 8/31 >No acute intracranial findings. There is moderate generalized atrophy and chronic appearing white matter hypodensities which likely represent small vessel ischemic disease.  CULTURES: Blood 8/31>> Urine 8/31 >>  ANTIBIOTICS: Zosyn 8/30 >>> Vancomycin 8/30 >>>  SIGNIFICANT  EVENTS: 8/31 - Admit with neutropenic fever  LINES/TUBES: PIV   DISCUSSION: 82 year old male with new diagnosis of myeloma on several immunosuppressive therapies. Presented with high fevers and hypoxia 8/31 early AM.   Likely sepsis of unclear etiology. Favour bacterial translocation as all other studies and cultures are negative.  He turned around with IVF, blood product, broad spectrum ABX. Off peripheral neo for 24 hrs.   Conservative management as per family. Code status is DNR/DNI.  ASSESSMENT / PLAN:  PULMONARY A: Hypoxemic respiratory failure COPD without acute exacerbation  P:   Supplemental O2 as needed to keep O2 sat > 92% On home bronchodilators PRN xopenex  CARDIOVASCULAR A:  Severe sepsis better with blood/fluids/abx Atrial fibrillation with RVR Inferior ST changes on EKG, trop mildly bumped H/o HTN, SVT  P:  Telemetry monitoring MAP goal > 5mHg D/C fluids. Advance diet Troponin is stable. Off neo for 24 hrs DNR  RENAL A:   Acute renal injury in the setting of sepsis/hypovolemia  P:   Cr is improving Strict I&O Monitor UOP Follow BMP  GASTROINTESTINAL A:   GERD Nausea/vomiting  P:   Advance diet as per nutrition Pepcid daily in setting renal failure PRN zofran (QTc 460)  HEMATOLOGIC A:   Multiple myeloma with significant marrow involvement Pancytopenia Anemia, acute on chronic is setting of myeloma and likely hemodilution, no evidence of active bleeding.  P:  Hematology following and input appreciated. Follow CBC. Transfuse as per hematology service  INFECTIOUS A:   Severe sepsis, etiology uncertain Immunocompromised host Neutropenic fever  P:   Broad ABX as above Follow cultures Fever control  ENDOCRINE CBG (last 3)   A:   DM Relative AI? Cortisol 65    P:   CBG monitoring q4hr On stress dose steroids. Start weaning tomorrow.  On decadron at baseline  NEUROLOGIC A:   Acute metabolic encephalopathy Chronic  back pain  P:   Monitor closely Very careful use of sedating medication, will opt to hold altogether  FAMILY  - Updates: Daughter updated in ED by PH/JN. 9/1 and 9/2 no family at bedside. - Inter-disciplinary family meet or Palliative Care meeting due by:  9/8 >  Stable for transfer from ICU.  PMarshell GarfinkelMD [facility] Pager [phone] no answer or after 3pm call: [phone] 11/10/2015, 7:48 AM

## 2015-11-11 LAB — BASIC METABOLIC PANEL
Anion gap: 8 (ref 5–15)
BUN: 47 mg/dL — AB (ref 6–20)
CALCIUM: 7.1 mg/dL — AB (ref 8.9–10.3)
CO2: 16 mmol/L — ABNORMAL LOW (ref 22–32)
CREATININE: 2.38 mg/dL — AB (ref 0.61–1.24)
Chloride: 115 mmol/L — ABNORMAL HIGH (ref 101–111)
GFR calc Af Amer: 26 mL/min — ABNORMAL LOW (ref >60)
GFR, EST NON AFRICAN AMERICAN: 23 mL/min — AB (ref >60)
GLUCOSE: 159 mg/dL — AB (ref 65–99)
Potassium: 3.8 mmol/L (ref 3.5–5.1)
SODIUM: 139 mmol/L (ref 135–145)

## 2015-11-11 LAB — CBC WITH DIFFERENTIAL/PLATELET
BASOS ABS: 0 10*3/uL (ref 0.0–0.1)
BASOS PCT: 0 %
EOS ABS: 0 10*3/uL (ref 0.0–0.7)
Eosinophils Relative: 0 %
HCT: 27.1 % — ABNORMAL LOW (ref 39.0–52.0)
Hemoglobin: 8.8 g/dL — ABNORMAL LOW (ref 13.0–17.0)
Lymphocytes Relative: 42 %
Lymphs Abs: 0.7 10*3/uL (ref 0.7–4.0)
MCH: 31.2 pg (ref 26.0–34.0)
MCHC: 32.5 g/dL (ref 30.0–36.0)
MCV: 96.1 fL (ref 78.0–100.0)
MONO ABS: 0 10*3/uL — AB (ref 0.1–1.0)
Monocytes Relative: 3 %
NEUTROS ABS: 0.9 10*3/uL — AB (ref 1.7–7.7)
Neutrophils Relative %: 55 %
PLATELETS: 37 10*3/uL — AB (ref 150–400)
RBC: 2.82 MIL/uL — ABNORMAL LOW (ref 4.22–5.81)
RDW: 20.6 % — AB (ref 11.5–15.5)
WBC: 1.6 10*3/uL — ABNORMAL LOW (ref 4.0–10.5)

## 2015-11-11 LAB — GLUCOSE, CAPILLARY
GLUCOSE-CAPILLARY: 150 mg/dL — AB (ref 65–99)
GLUCOSE-CAPILLARY: 138 mg/dL — AB (ref 65–99)
Glucose-Capillary: 119 mg/dL — ABNORMAL HIGH (ref 65–99)
Glucose-Capillary: 148 mg/dL — ABNORMAL HIGH (ref 65–99)
Glucose-Capillary: 151 mg/dL — ABNORMAL HIGH (ref 65–99)
Glucose-Capillary: 199 mg/dL — ABNORMAL HIGH (ref 65–99)

## 2015-11-11 MED ORDER — HEPARIN SODIUM (PORCINE) 5000 UNIT/ML IJ SOLN: 5000.0000 [IU] | Freq: Three times a day (TID) | INTRAMUSCULAR | Status: DC

## 2015-11-11 MED ORDER — METOPROLOL TARTRATE 25 MG PO TABS
25.0000 mg | ORAL_TABLET | Freq: Two times a day (BID) | ORAL | Status: DC
  Administered 2015-11-11 – 2015-11-16 (×11): 25 mg via ORAL
  Filled 2015-11-11 (×12): qty 1

## 2015-11-11 NOTE — Progress Notes (Signed)
Pharmacy note: heparin   82 yo male with severe sepsis, multiple myeloma and AKI. Pharmacy consulted to dose heparin sq for VTE prophylaxis -platelets= 37 (trend down)  Plan -Spoke with Dr. Dr. Hetty Ely. Will hold of on heparin dq due to platelet trend  Hildred Laser, Pharm D 11/11/2015 11:37 AM

## 2015-11-11 NOTE — Progress Notes (Signed)
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 Presented on 8/31 with complaints of progressive generalized weakness, fever, vomiting, worsening of chronic back pain, and incoherence. He had received blood transfusions the day prior to admission for pancytopenia. He was diagnosed with multiple myeloma 3 weeks prior to admission, his treatment medications include dexamethasone '20mg'$  weekly and lenalidomide (revlimid) 5 doses so far.   In the ED he was found to have fever of 104, hypotensive with blood pressure 90/40, tachycardic with HR 125, and hypoxemic with SpO2 75 on room air. CBC showed pancytopenia with WBC 1.1, Neutrophils 0.9, Hgb 6.4, Plt 51. Chest x-ray showed no obvious infiltrate, blood cultures and urine cultures were collected. He received 3 liters of IV fluids, 2 units of packed red blood, and was started on Levaquin, vancomycin, pipercillin-tazobactam.   He was admitted to the ICU EKG found A. fib with RVR and inferior ST changes troponin peaked at 0.68. His acute renal injury secondary to sepsis was treated with gentle IV fluid hydration. Blood cultures have been negative for 2 days, urine cultures showed no growth. 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 He states for the last few weeks he has had some exertional shortness of breath. Since admission he has noticed that he is experiencing a dry cough without shortness of breath. He has had about 3 episodes of loose stool throughout the days but he denies diarrhea. He denies any fever or chills, chances in vision, dizziness, headache, chest pain, abdominal pain, nausea, dysuria, or myalgia.   Objective:  Vital signs in last 24 hours: Vitals:   11/10/15 1803 11/10/15 2035 11/11/15  0447 11/11/15 0914  BP: 112/70 111/60 114/61 110/76  Pulse: 82 62 96 100  Resp: '18 18 16 16  '$ Temp: 98 F (36.7 C) 97.5 F (36.4 C) 98.3 F (36.8 C) 97.8 F (36.6 C)  TempSrc: Oral Oral Oral Oral  SpO2: 99% 94% 96% 96%  Weight:       24-hour weight change: Weight change:  Intake/Output:  09/02 0701 - 09/03 0700 In: 2330 [P.O.:1080; I.V.:1250] Out: 628 [Urine:875]   Physical Exam  Constitutional: He is oriented to person, place, and time. He appears well-developed and well-nourished. No distress.  HENT:  Head: Normocephalic and atraumatic.  Cardiovascular:  Distant heart sounds  Pulmonary/Chest: Effort normal. Tachypnea noted. No respiratory distress. He has no wheezes. He has no rales.  Abdominal: Soft. Bowel sounds are normal. He exhibits no distension and no mass. There is no tenderness. There is no guarding.  Neurological: He is alert and oriented to person, place, and time.  Skin: Skin is warm and dry.  Peeling skin over abdomen and back  Extremities: no calf tenderness   Labs: CBC:  Recent Labs Lab 11/08/15 1259 11/08/15 1505 11/09/15 0208 11/10/15 0212 11/11/15 0449  WBC 1.3* 1.5* 0.9* 1.0* 1.6*  NEUTROABS 1.0* 1.0* 0.6* 0.6* 0.9*  HGB 10.3* 9.3* 9.0* 9.0* 8.8*  HCT 31.0* 28.4* 26.8* 27.6* 27.1*  MCV 95.4 95.3 94.4 96.8 96.1  PLT 55* 55* 47* 36* 37*   Metabolic Panel:  Recent Labs Lab 11/06/15 0855 11/08/15 0320  11/08/15 0423 11/09/15 0208 11/09/15 1950 11/10/15 0212 11/11/15 0449  NA 139 QUESTIONABLE RESULTS, RECOMMEND RECOLLECT TO VERIFY  < > 136  137 133* 137 139  K 4.6 QUESTIONABLE RESULTS, RECOMMEND RECOLLECT TO VERIFY  < > 3.9 3.7 3.7 4.0 3.8  CL 104 QUESTIONABLE RESULTS, RECOMMEND RECOLLECT TO VERIFY  < > 105 107 108 113* 115*  CO2 26 QUESTIONABLE RESULTS, RECOMMEND RECOLLECT TO VERIFY  --  19* 19* 18* 16* 16*  GLUCOSE 135* QUESTIONABLE RESULTS, RECOMMEND RECOLLECT TO VERIFY  < > 150* 108* 176* 168* 159*  BUN 44* QUESTIONABLE RESULTS,  RECOMMEND RECOLLECT TO VERIFY  < > 48* 38* 36* 38* 47*  CREATININE 2.17* QUESTIONABLE RESULTS, RECOMMEND RECOLLECT TO VERIFY  < > 2.55* 2.43* 2.23* 2.21* 2.38*  CALCIUM 10.5* QUESTIONABLE RESULTS, RECOMMEND RECOLLECT TO VERIFY  --  9.0 8.0* 7.2* 7.5* 7.1*  MG  --   --   --  1.4* 1.4*  --  2.1  --   PHOS  --   --   --  3.5 3.1  --  4.5  --   ALT 13* QUESTIONABLE RESULTS, RECOMMEND RECOLLECT TO VERIFY  --  11*  --   --   --   --   ALKPHOS 121 QUESTIONABLE RESULTS, RECOMMEND RECOLLECT TO VERIFY  --  82  --   --   --   --   BILITOT 2.6* QUESTIONABLE RESULTS, RECOMMEND RECOLLECT TO VERIFY  --  2.0*  --   --   --   --   PROT 7.6 QUESTIONABLE RESULTS, RECOMMEND RECOLLECT TO VERIFY  --  5.7*  --   --   --   --   ALBUMIN 3.2* QUESTIONABLE RESULTS, RECOMMEND RECOLLECT TO VERIFY  --  2.5* 2.1*  --   --   --   LABPROT  --  14.9  --   --   --   --   --   --   INR  --  1.17  --   --   --   --   --   --   < > = values in this interval not displayed. Cardiac Labs:  Recent Labs Lab 11/08/15 0327 11/08/15 1259 11/08/15 2039 11/09/15 0109  TROPIPOC 0.17*  --   --   --   TROPONINI  --  0.47* 0.68* 0.68*   BG:  Recent Labs Lab 11/10/15 1624 11/10/15 2027 11/11/15 0038 11/11/15 0438 11/11/15 0731  GLUCAP 194* 208* 199* 151* 150*   No results found for: HGBA1C  Microbiology  Blood cultures drawn 8/31 no growth 2 days  Urine culture taken 8/31 no growth   Imaging: CT head 8/31  No acute intracranial findings. There is moderate generalized atrophy and chronic appearing white matter hypodensities which likely represent small vessel ischemic disease.  Xray lumbar spine 8/31 No acute fracture or subluxation of the lumbar spine. Severe degenerative disc disease, greatest at L5-S1.  Chest Xray 8/31 Cardiomegaly without pulmonary edema or focal consolidation.   Medications: Infusions: . sodium chloride 125 mL/hr at 11/11/15 0620   Scheduled Medications: . sodium chloride   Intravenous Once   . [START ON 11/12/2015] dexamethasone  20 mg Oral Q Mon  . feeding supplement (GLUCERNA SHAKE)  237 mL Oral TID BM  . linagliptin  5 mg Oral Daily  . metoprolol tartrate  12.5 mg Oral BID  . pantoprazole  40 mg Oral QODAY  . piperacillin-tazobactam (ZOSYN)  IV  2.25 g Intravenous Q6H  . sodium chloride  500 mL Intravenous Once   And  . sodium chloride  250 mL Intravenous Once  . tamsulosin  0.4 mg  Oral QPC supper  . vancomycin  750 mg Intravenous Q48H   PRN Medications: sodium chloride, levalbuterol  Assessment/Plan: Pt is a 81 y.o. yo male with a PMHx of multiple myeloma, CKD stage III, prostate cancer, spinal stenosis, supraventricular tachycardia, COPD and diabetes mellitus who was admitted on 11/08/2015 with symptoms of weakness and altered mental status, which was determined to be secondary to neutropenic fever.   Active Problems:   Septic shock (HCC)  Neutropenic fever  -continue vanc and zosyn until WBC count improves.  - isolation precautions  - follow up CBC   Acute kidney injury Crt 2.38 with baseline 1.80. Improving with IV fluids. Will continue IV fluids.  -follow up BMET   Multiple myeloma Hematology is following  -continue Decadron 20 mg weekly, holding revlimid for now   Supraventricular tachycardia rates 100s -Metoprolol 12.5 BID increased to '25mg'$  BID for rate control   COPD PFT 2010 FEV1/FVC 66%  -continue levalbuterol nebs   Dispo: Anticipated discharge in approximately 2-4 day(s).   LOS: 3 days   Ledell Noss, MD 11/11/2015, 11:00 AM Pager: [phone]

## 2015-11-12 DIAGNOSIS — R5081 Fever presenting with conditions classified elsewhere: Secondary | ICD-10-CM

## 2015-11-12 DIAGNOSIS — I4891 Unspecified atrial fibrillation: Secondary | ICD-10-CM

## 2015-11-12 DIAGNOSIS — N184 Chronic kidney disease, stage 4 (severe): Secondary | ICD-10-CM

## 2015-11-12 DIAGNOSIS — I453 Trifascicular block: Secondary | ICD-10-CM

## 2015-11-12 DIAGNOSIS — J449 Chronic obstructive pulmonary disease, unspecified: Secondary | ICD-10-CM

## 2015-11-12 DIAGNOSIS — D709 Neutropenia, unspecified: Secondary | ICD-10-CM

## 2015-11-12 DIAGNOSIS — K521 Toxic gastroenteritis and colitis: Secondary | ICD-10-CM

## 2015-11-12 DIAGNOSIS — T3695XA Adverse effect of unspecified systemic antibiotic, initial encounter: Secondary | ICD-10-CM

## 2015-11-12 DIAGNOSIS — I25811 Atherosclerosis of native coronary artery of transplanted heart without angina pectoris: Secondary | ICD-10-CM

## 2015-11-12 DIAGNOSIS — R197 Diarrhea, unspecified: Secondary | ICD-10-CM

## 2015-11-12 DIAGNOSIS — D708 Other neutropenia: Secondary | ICD-10-CM

## 2015-11-12 LAB — GLUCOSE, CAPILLARY
GLUCOSE-CAPILLARY: 111 mg/dL — AB (ref 65–99)
GLUCOSE-CAPILLARY: 147 mg/dL — AB (ref 65–99)
GLUCOSE-CAPILLARY: 199 mg/dL — AB (ref 65–99)
Glucose-Capillary: 126 mg/dL — ABNORMAL HIGH (ref 65–99)
Glucose-Capillary: 129 mg/dL — ABNORMAL HIGH (ref 65–99)
Glucose-Capillary: 164 mg/dL — ABNORMAL HIGH (ref 65–99)
Glucose-Capillary: 183 mg/dL — ABNORMAL HIGH (ref 65–99)

## 2015-11-12 LAB — BASIC METABOLIC PANEL
Anion gap: 6 (ref 5–15)
BUN: 44 mg/dL — AB (ref 6–20)
CHLORIDE: 117 mmol/L — AB (ref 101–111)
CO2: 18 mmol/L — ABNORMAL LOW (ref 22–32)
Calcium: 6.9 mg/dL — ABNORMAL LOW (ref 8.9–10.3)
Creatinine, Ser: 2.33 mg/dL — ABNORMAL HIGH (ref 0.61–1.24)
GFR calc Af Amer: 27 mL/min — ABNORMAL LOW (ref >60)
GFR calc non Af Amer: 23 mL/min — ABNORMAL LOW (ref >60)
GLUCOSE: 123 mg/dL — AB (ref 65–99)
POTASSIUM: 3.6 mmol/L (ref 3.5–5.1)
SODIUM: 141 mmol/L (ref 135–145)

## 2015-11-12 LAB — CBC WITH DIFFERENTIAL/PLATELET
BASOS PCT: 0 %
Basophils Absolute: 0 10*3/uL (ref 0.0–0.1)
EOS PCT: 2 %
Eosinophils Absolute: 0 10*3/uL (ref 0.0–0.7)
HEMATOCRIT: 26.3 % — AB (ref 39.0–52.0)
HEMOGLOBIN: 8.6 g/dL — AB (ref 13.0–17.0)
LYMPHS PCT: 42 %
Lymphs Abs: 0.5 10*3/uL — ABNORMAL LOW (ref 0.7–4.0)
MCH: 31.7 pg (ref 26.0–34.0)
MCHC: 32.7 g/dL (ref 30.0–36.0)
MCV: 97 fL (ref 78.0–100.0)
Monocytes Absolute: 0 10*3/uL — ABNORMAL LOW (ref 0.1–1.0)
Monocytes Relative: 2 %
NEUTROS PCT: 54 %
Neutro Abs: 0.8 10*3/uL — ABNORMAL LOW (ref 1.7–7.7)
Platelets: 31 10*3/uL — ABNORMAL LOW (ref 150–400)
RBC: 2.71 MIL/uL — ABNORMAL LOW (ref 4.22–5.81)
RDW: 20.8 % — ABNORMAL HIGH (ref 11.5–15.5)
WBC: 1.3 10*3/uL — CL (ref 4.0–10.5)

## 2015-11-12 LAB — C DIFFICILE QUICK SCREEN W PCR REFLEX
C DIFFICLE (CDIFF) ANTIGEN: NEGATIVE
C Diff interpretation: NOT DETECTED
C Diff toxin: NEGATIVE

## 2015-11-12 LAB — VANCOMYCIN, TROUGH: Vancomycin Tr: 9 ug/mL — ABNORMAL LOW (ref 15–20)

## 2015-11-12 MED ORDER — METOPROLOL TARTRATE 12.5 MG HALF TABLET
12.5000 mg | ORAL_TABLET | Freq: Once | ORAL | Status: AC
  Administered 2015-11-12: 12.5 mg via ORAL
  Filled 2015-11-12: qty 1

## 2015-11-12 MED ORDER — MELATONIN 3 MG PO TABS
5.0000 mg | ORAL_TABLET | Freq: Once | ORAL | Status: AC | PRN
Start: 2015-11-12 — End: 2015-11-12
  Administered 2015-11-12: 4.5 mg via ORAL
  Filled 2015-11-12: qty 1.5

## 2015-11-12 MED ORDER — MELATONIN 3 MG PO TABS
6.0000 mg | ORAL_TABLET | Freq: Once | ORAL | Status: DC | PRN
  Filled 2015-11-12: qty 2

## 2015-11-12 MED ORDER — HEPARIN SODIUM (PORCINE) 5000 UNIT/ML IJ SOLN
5000.0000 [IU] | Freq: Two times a day (BID) | INTRAMUSCULAR | Status: DC
  Administered 2015-11-12 – 2015-11-13 (×3): 5000 [IU] via SUBCUTANEOUS
  Filled 2015-11-12 (×3): qty 1

## 2015-11-12 MED ORDER — MELATONIN 3 MG PO TABS: 6.0000 mg | ORAL_TABLET | Freq: Once | ORAL | Status: DC | PRN

## 2015-11-12 MED ORDER — CEFEPIME HCL 2 G IJ SOLR: 2.0000 g | Freq: Three times a day (TID) | INTRAMUSCULAR | Status: DC

## 2015-11-12 MED ORDER — DEXTROSE 5 % IV SOLN
2.0000 g | INTRAVENOUS | Status: AC
  Administered 2015-11-12 – 2015-11-14 (×3): 2 g via INTRAVENOUS
  Filled 2015-11-12 (×5): qty 2

## 2015-11-12 MED ORDER — MELATONIN 3 MG PO TABS
4.5000 mg | ORAL_TABLET | Freq: Every evening | ORAL | Status: DC | PRN
  Administered 2015-11-12 – 2015-11-13 (×2): 4.5 mg via ORAL
  Filled 2015-11-12 (×3): qty 1.5

## 2015-11-12 MED ORDER — AMIODARONE HCL 200 MG PO TABS
200.0000 mg | ORAL_TABLET | Freq: Two times a day (BID) | ORAL | Status: DC
  Administered 2015-11-12 – 2015-11-15 (×7): 200 mg via ORAL
  Filled 2015-11-12 (×8): qty 1

## 2015-11-12 MED ORDER — BISMUTH SUBSALICYLATE 262 MG/15ML PO SUSP
30.0000 mL | ORAL | Status: DC | PRN
  Administered 2015-11-12 – 2015-11-13 (×3): 30 mL via ORAL
  Filled 2015-11-12: qty 118

## 2015-11-12 NOTE — Progress Notes (Signed)
ANTICOAGULATION CONSULT NOTE - Initial Consult  Pharmacy Consult for SQ heparin  Indication: VTE prophylaxis  Allergies  Allergen Reactions  . Meperidine Hcl Other (See Comments)    REACTION: intol---causes nausea  . Insulins Other (See Comments)    No allergy but patient does NOT want any insulin therapy at this time. He discussed this with MD on 05/18/14    Patient Measurements: Height: '5\' 7"'$  (170.2 cm) (recorded on 11/07/15 @ 09:00) Weight: 133 lb 13.1 oz (60.7 kg) IBW/kg (Calculated) : 66.1   Vital Signs: Temp: 97.5 F (36.4 C) (09/04 0812) Temp Source: Oral (09/04 0812) BP: 112/73 (09/04 1103) Pulse Rate: 86 (09/04 1103)  Labs:  Recent Labs  11/10/15 0212 11/11/15 0449 11/12/15 0159  HGB 9.0* 8.8* 8.6*  HCT 27.6* 27.1* 26.3*  PLT 36* 37* 31*  CREATININE 2.21* 2.38* 2.33*    Estimated Creatinine Clearance: 18.5 mL/min (by C-G formula based on SCr of 2.33 mg/dL).   Medical History: Past Medical History:  Diagnosis Date  . Anemia   . CKD (chronic kidney disease), stage III 05/17/2014  . Coronary artery disease   . Degenerative joint disease   . DYSPNEA ON EXERTION 07/19/2008  . GERD (gastroesophageal reflux disease)   . Hiatal hernia   . Hypertension   . MGUS (monoclonal gammopathy of unknown significance) 01/31/2014   IgA kappa dx 07/22/10   . Monoclonal gammopathy   . Multiple myeloma ([place]) 10/11/2015   IGA kappa  84% plasma cells BM bx 10/08/15;  IgA 1,226 mg&; FLC ratio 24.25; kappa 402.6; Albumin 3.8, beta-2-miroglobulin 6.5; calcium 9.7; Hb 8.2; BUN 36; Cr 1.7; skeletal survey no lytic lesions 10/11/15  . Pancytopenia ([place]) 10/03/2015  . Peripheral neuropathy ([place])   . Prostate cancer ([place])   . pulmonary hypertension   . Pulmonary hypertension ([place])   . Spinal stenosis   . SVT (supraventricular tachycardia) ([place]) hospitalized 05/17/2014  . Type II diabetes mellitus (HCC)     Medications:  Prescriptions Prior to Admission  Medication Sig Dispense Refill  Last Dose  . amoxicillin (AMOXIL) 500 MG capsule Take 2,000 mg by mouth See admin instructions. Before dental procedures   unk  . aspirin 81 MG tablet Take 81 mg by mouth every other day.    11/07/2015 at Unknown time  . cholecalciferol (VITAMIN D) 1000 UNITS tablet Take 1,000 Units by mouth daily.   11/07/2015 at Unknown time  . Cyanocobalamin (VITAMIN B-12 IJ) Inject 1 mL as directed every 30 (thirty) days.   unk  . dexamethasone (DECADRON) 4 MG tablet Take 5 tablets (20 mg total) by mouth once a week. 40 tablet 6 11/02/2015  . lenalidomide (REVLIMID) 10 MG capsule Take one 10 mg tab every 3 days 21 capsule 6 11/05/2015  . linagliptin (TRADJENTA) 5 MG TABS tablet Take 5 mg by mouth daily.   11/07/2015 at Unknown time  . metoprolol tartrate (LOPRESSOR) 25 MG tablet Take 25-37.5 mg by mouth 2 (two) times daily. Take 37.'5mg'$  in the am and '25mg'$  in the evening   11/07/2015 at 1930  . omeprazole (PRILOSEC) 20 MG capsule Take 20 mg by mouth every other day.   11/07/2015 at Unknown time  . pyridOXINE (B-6) 50 MG tablet Take 50 mg by mouth daily.   11/07/2015 at Unknown time  . Tamsulosin HCl (FLOMAX) 0.4 MG CAPS Take 0.4 mg by mouth daily. 30 min after the same meal, daily    11/07/2015 at Unknown time  . vitamin C (ASCORBIC ACID) 500 MG  tablet Take 500 mg by mouth daily.     11/07/2015 at Unknown time    Assessment: 82 y.o male recently diagnosed with multiple myeloma, severe sepsis, and acute on chronic renal failure.  Pancytopenia secondary to multiple myeloma and sepsis-stable, pltc 31k.  Pltc = 72K on admission date 11/06/15.   Hgb 8.6 today.  No bleeding reported.  Dr. Hulen Luster ordered pharmacy consult for lovenox for VTE prophylaxis.  I spoke to Dr. Lindi Adie, oncologist on call about the use of lovenox in this  patient with multiple myeloma-high risk of blood clot, low pltc of 31 and acute on CKD stage III-IV with CrCl ~ 18 ml/min. He recommends SQ heparin 5000 units SQ q12h for VTE ppx- Dr. Hulen Luster agreed.    Goal of Therapy:  Monitor platelets by anticoagulation protocol: Yes   Plan:  Start SQ heparin 5000 units SQ q12h  Monitor CBC and for signs and symptoms of bleeding.   Nicole Cella, RPh Clinical Pharmacist Pager: [phone] 11/12/2015,11:19 AM

## 2015-11-12 NOTE — Progress Notes (Signed)
Internal Medicine Attending Admission Note Date: 11/12/2015  Patient name: Paul Baller, MD Medical record number: [id number] Date of birth: 1925-01-19 Age: 82 y.o. Gender: male  I saw and evaluated the patient. I reviewed the resident's transfer summary from 11/11/2015 and I agree with the resident's findings and plan as documented in her transfer summary with modifications based upon overnight events as outlined below.  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  Nursing reports the stools were more gelatinous than watery and that each episode only produce a small amount, but the episodes were frequent.  Other than the loose and frequent stools there were no new complaints.  Physical Exam - key components:  Vitals:   11/11/15 2115 11/12/15 0512 11/12/15 0812 11/12/15 1103  BP: 122/64 134/81 118/82 112/73  Pulse: 96 (!) 103 (!) 53 86  Resp: '16 18 17 19  '$ Temp: 97.8 F (36.6 C) 97.9 F (36.6 C) 97.5 F (36.4 C)   TempSrc: Oral Oral Oral   SpO2: 97% 94% 99% (!) 87%  Weight:      Height:    '5\' 7"'$  (1.702 m)   Gen: WDWN elderly man lying comfortably flat in bed in NAD. Skin: Dry flaking skin throughout which he states is a chronic issue since childhood. Lungs: w/o wheezes, rhonch1, or rales CV:  At the time of my examination he was slightly bradycardic and appeared to have a regular rhythm Abd: Soft, non-tender, slightly hyperactive bowel sounds w/o guarding or rebound tenderness Ext: w/o edema  Lab results:  Basic Metabolic Panel:  Recent Labs  11/10/15 0212 11/11/15 0449 11/12/15 0159  NA 137 139 141  K 4.0 3.8 3.6  CL 113* 115* 117*  CO2 16* 16* 18*  GLUCOSE 168* 159* 123*  BUN 38* 47* 44*  CREATININE 2.21* 2.38* 2.33*  CALCIUM 7.5* 7.1* 6.9*  MG 2.1  --   --   PHOS 4.5  --   --    CBC:  Recent Labs  11/11/15 0449 11/12/15 0159  WBC 1.6* 1.3*  NEUTROABS 0.9* 0.8*  HGB 8.8* 8.6*  HCT 27.1* 26.3*  MCV 96.1  97.0  PLT 37* 31*   CBG:  Recent Labs  11/11/15 1150 11/11/15 1623 11/11/15 2104 11/12/15 0013 11/12/15 0507 11/12/15 0808  GLUCAP 148* 119* 138* 129* 126* 111*   Misc. 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 He was found to be neutropenic and therefore was admitted to the medical intensive care unit for neutropenic fevers complicated by sepsis. Fortunately, he responded well to broad-spectrum antibiotics and IV fluid resuscitation with improvement in his vitals, resolution of his fevers, and movement of his acute renal failure closer to his chronic baseline. Cultures thus far have been negative or no growth to date. Overnight, he suffered from loose and frequent stools that were negative for C. difficile. The differential is broad and includes antibiotic associated diarrhea. His abdominal examination as of this morning was benign.  1) Neutropenic fevers complicated by sepsis: His absolute neutrophil count this morning remained low at 800. 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 It  would not be unexpected that he remains neutropenic for a while given the myeloma infiltration in his marrow. The therapy he has received may ultimately turn this around but initially his counts may actually worsen before they improve. Fortunately, he has remained afebrile with decent vital signs and up until now has tolerated the antibiotics for the last 5 days. As his cultures remained negative, including his C. difficile, it is safe to narrow his antibiotics. As febrile neutropenia gram-negative rod bacteremia carries a high mortality, it would be prudent to  continue the IV antibiotics until either the neutropenia resolves or he completes an empiric course for a presumed gram-negative bacteremia. We will therefore complete 7 days of IV antibiotics should he remain neutropenic. This means we will discontinue the antibiotics, assuming all cultures are negative and he remains clinically improving, on the morning of September 7. Without a central line Gram positive organisms are unlikely to have played a role in his presentation and at this point it is safe to discontinue the IV vancomycin. To further narrow his coverage to cover the presumed gram-negative rod bacteremia we will discontinue the Zosyn and start cefepime. As his creatinine clearance is estimated to be 23 mL/min we will renally dose the cefepime at 2 g IV every 24 hours. Again, this will likely be discontinued on the morning of September 7.  2) Loose stools: Likely antibiotic associated diarrhea. C. difficile has been ruled out by stool studies this morning. We are narrowing the spectrum of his antibiotics which hopefully will improve his diarrhea. We will continue to follow serial abdominal examinations to assure his abdomen remains benign and we are not missing another potential cause for the stool change.  3) Acute on chronic renal failure: His creatinine has been stable over the last 3 days with an eGFR of 23. Given his frequent stools we will continue the gentle IV hydration so he does not become dehydrated. We are renally dosing his medications and avoiding nephrotoxic agents.  4) Atrial fibrillation: We appreciate Dr. Thompson Caul input. He is temporarily being started on amiodarone and a low-dose beta blocker to control his rate. We will continue telemetric monitoring on this rate controlling regimen.  5) Multiple myeloma: We are continuing the Decadron at this time. We appreciate Dr. Ashok Cordia guidance during Dr. Azucena Freed vacation.  6) Disposition: He will require IV antibiotics through  September 6 given his febrile neutropenia. If he continues to do well clinically without any further fevers and his cultures remain negative we will be able to stop the antibiotics at that time. Anticipating that, we will ask physical therapy to begin working with him so that deconditioning does not become profound and hinder discharge from the hospital.

## 2015-11-12 NOTE — Progress Notes (Signed)
IP PROGRESS NOTE  Subjective:   He complains of frequent bowel movements during the night. He also complains of insomnia and exertional dyspnea.  Objective: Vital signs in last 24 hours: Blood pressure 134/81, pulse (!) 103, temperature 97.9 F (36.6 C), temperature source Oral, resp. rate 18, weight 133 lb 13.1 oz (60.7 kg), SpO2 94 %.  Intake/Output from previous day: 09/03 0701 - 09/04 0700 In: 5047.9 [P.O.:900; I.V.:4147.9] Out: 0856 [Urine:1825]  Physical Exam:  HEENT: No thrush or bleeding Lungs: Clear anteriorly Cardiac: Irregular Abdomen: Soft and nontender Extremities: No leg edema Neurologic: Alert, follows commands   Lab Results:  Recent Labs  11/11/15 0449 11/12/15 0159  WBC 1.6* 1.3*  HGB 8.8* 8.6*  HCT 27.1* 26.3*  PLT 37* 31*    BMET  Recent Labs  11/11/15 0449 11/12/15 0159  NA 139 141  K 3.8 3.6  CL 115* 117*  CO2 16* 18*  GLUCOSE 159* 123*  BUN 47* 44*  CREATININE 2.38* 2.33*  CALCIUM 7.1* 6.9*    Studies/Results: No results found.  Medications: I have reviewed the patient's current medications.  Assessment/Plan:  1. Multiple myeloma, IgG kappa, currently on treatment with weekly Decadron. Revlimid on hold secondary to pancytopenia  2. Admission 11/08/2015 sepsis syndrome, improved, cultures remain negative to date  3. Pancytopenia secondary to multiple myeloma and sepsis-stable  4. COPD  5.  Atrial fibrillation  6.  Acute/Chronic renal failure  7.   Diarrhea-likely secondary to ask, nutrition supplements  8.   History of hypercalcemia-improved following Zometa 11/07/2015  The fever has resolved and he is no longer hypotensive. No source for infection has been identified. He has persistent pancytopenia secondary to myeloma, Revlimid, and admission with sepsis  Recommendations: 1. Continue weekly Decadron, hold Revlimid 2. Complete at least 7 days of broad-spectrum antibiotics 3. Transfuse platelets for a count of less  than 10,000 or bleeding 4. Check stool for C. difficile if he has persistent diarrhea today  Please call oncology as needed. I will continue checking on him while [name] is out of town.    LOS: 4 days   Betsy Coder, MD   11/12/2015, 7:34 AM

## 2015-11-12 NOTE — Consult Note (Signed)
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 Primary Cardiologist:  HWBSmith, III  Reason for Consultation:  Atrial fibrillation with RVR  ASSESSMENT: 1. Atrial fibrillation with RVR, new onset, and likely acute illness related. The RVR is concerning because it may precipitate ischemic complications given his history of underlying CAD. 2. Multiple myeloma with chemotherapy related neutropenia and sepsis 3. Sepsis and hypotension, resolved 4. Coronary artery disease with prior remote multivessel PCI 5. History of PSVT 6. 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 7. Pancytopenia 8. Stage III/for CKD  PLAN:  1. Not currently a candidate for anticoagulation therapy for stroke prophylaxis because of bleeding risk. 2. Clinically, there is no evidence of volume overload, and therefore no overt evidence of cardiac decompensation related to atrial fibrillation. 3. Current management of atrial fibrillation should be with rate control and hopeful maintenance of sinus rhythm if spontaneous conversion now that he is feeling better and over his acute illness. Agree with low-dose beta blocker therapy. Will also recommend adding low-dose amiodarone to hopefully facilitate cardioversion to normal sinus rhythm. Amio should not be a long-term therapy because it would likely add to the possibility of high-grade AV block and requirement for pacemaker therapy.   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 Most recent addition is multiple myeloma and initiation of chemotherapy. He was admitted to the hospital with sepsis and hypotension. He has a history of coronary artery disease, previous PCI, AV conduction system disease  (trifascicular block), PSVT, hypertension, and stage III chronic kidney disease.  He currently has complained of multiple bowel movements. No abdominal discomfort. He denies dyspnea and chest pain.  Monitor demonstrates atrial fibrillation with RVR greater than 150 bpm.  PMH:   Past Medical History:  Diagnosis Date  . Anemia   . CKD (chronic kidney disease), stage III 05/17/2014  . Coronary artery disease   . Degenerative joint disease   . DYSPNEA ON EXERTION 07/19/2008  . GERD (gastroesophageal reflux disease)   . Hiatal hernia   . Hypertension   . MGUS (monoclonal gammopathy of unknown significance) 01/31/2014   IgA kappa dx 07/22/10   . Monoclonal gammopathy   . Multiple myeloma ([place]) 10/11/2015   IGA kappa  84% plasma cells BM bx 10/08/15;  IgA 1,226 mg&; FLC ratio 24.25; kappa 402.6; Albumin 3.8, beta-2-miroglobulin 6.5; calcium 9.7; Hb 8.2; BUN 36; Cr 1.7; skeletal survey no lytic lesions 10/11/15  . Pancytopenia ([place]) 10/03/2015  . Peripheral neuropathy ([place])   . Prostate cancer ([place])   . pulmonary hypertension   . Pulmonary hypertension ([place])   . Spinal stenosis   . SVT (supraventricular tachycardia) ([place]) hospitalized 05/17/2014  . Type II diabetes mellitus ([facility])     PSH:   Past Surgical History:  Procedure Laterality Date  . CHOLECYSTECTOMY    . COLON SURGERY    . CORONARY ANGIOPLASTY    . JOINT REPLACEMENT    . LAMINECTOMY  x 4 last sx 1960's  . TOTAL HIP ARTHROPLASTY Right 11/18/2010   Allergies:  Meperidine hcl and Insulins Prior to Admit Meds:   Prescriptions Prior to Admission  Medication Sig Dispense Refill Last Dose  . amoxicillin (AMOXIL) 500 MG capsule Take 2,000 mg by mouth See admin instructions. Before dental procedures   unk  .  aspirin 81 MG tablet Take 81 mg by mouth every other day.    11/07/2015 at Unknown time  . cholecalciferol (VITAMIN D) 1000 UNITS tablet Take 1,000 Units by mouth daily.   11/07/2015 at Unknown time  . Cyanocobalamin (VITAMIN B-12 IJ)  Inject 1 mL as directed every 30 (thirty) days.   unk  . dexamethasone (DECADRON) 4 MG tablet Take 5 tablets (20 mg total) by mouth once a week. 40 tablet 6 11/02/2015  . lenalidomide (REVLIMID) 10 MG capsule Take one 10 mg tab every 3 days 21 capsule 6 11/05/2015  . linagliptin (TRADJENTA) 5 MG TABS tablet Take 5 mg by mouth daily.   11/07/2015 at Unknown time  . metoprolol tartrate (LOPRESSOR) 25 MG tablet Take 25-37.5 mg by mouth 2 (two) times daily. Take 37.'5mg'$  in the am and '25mg'$  in the evening   11/07/2015 at 1930  . omeprazole (PRILOSEC) 20 MG capsule Take 20 mg by mouth every other day.   11/07/2015 at Unknown time  . pyridOXINE (B-6) 50 MG tablet Take 50 mg by mouth daily.   11/07/2015 at Unknown time  . Tamsulosin HCl (FLOMAX) 0.4 MG CAPS Take 0.4 mg by mouth daily. 30 min after the same meal, daily    11/07/2015 at Unknown time  . vitamin C (ASCORBIC ACID) 500 MG tablet Take 500 mg by mouth daily.     11/07/2015 at Unknown time   Fam HX:    Family History  Problem Relation Age of Onset  . Heart attack Mother   . Heart attack Father   . Cancer Father   . Stroke Neg Hx    Social HX:    Social History   Social History  . Marital status: Married    Spouse name: N/A  . Number of children: N/A  . Years of education: N/A   Occupational History  . Not on file.   Social History Main Topics  . Smoking status: Former Smoker    Types: Cigarettes    Quit date: 02/09/1974  . Smokeless tobacco: Never Used  . Alcohol use No  . Drug use: No  . Sexual activity: No   Other Topics Concern  . Not on file   Social History Narrative  . No narrative on file     Review of Systems: Feels weak, fatigued, poor appetite, and having watery diarrhea.. All other systems are negative.  Physical Exam: Blood pressure 118/82, pulse (!) 53, temperature 97.5 F (36.4 C), temperature source Oral, resp. rate 17, weight 133 lb 13.1 oz (60.7 kg), SpO2 99 %. Weight change:    Frail appearing, lying it 30  moaning. Engages in conversation and is lucid. Skin is fragile. Skin turgoris diminished. HEENT exam reveals no jaundice Neck exam reveals no JVD Chest is clear anterior and posterior by auscultation Cardiac exam reveals no gallop or rub. No murmur of significance is heard. Abdomen is soft. Bowel sounds are normal. Extremities reveal no edema. Marked loss of muscle mass is noted. No obvious neurological abnormality.  Labs: Lab Results  Component Value Date   WBC 1.3 (LL) 11/12/2015   HGB 8.6 (L) 11/12/2015   HCT 26.3 (L) 11/12/2015   MCV 97.0 11/12/2015   PLT 31 (L) 11/12/2015    Recent Labs Lab 11/08/15 0423  11/12/15 0159  NA 136  < > 141  K 3.9  < > 3.6  CL 105  < > 117*  CO2 19*  < > 18*  BUN 48*  < >  44*  CREATININE 2.55*  < > 2.33*  CALCIUM 9.0  < > 6.9*  PROT 5.7*  --   --   BILITOT 2.0*  --   --   ALKPHOS 82  --   --   ALT 11*  --   --   AST 17  --   --   GLUCOSE 150*  < > 123*  < > = values in this interval not displayed. No results found for: PTT Lab Results  Component Value Date   INR 1.17 11/08/2015   INR 0.94 11/13/2010   Lab Results  Component Value Date   TROPONINI 0.68 ([place]) 11/09/2015    No results found for: CHOL No results found for: HDL No results found for: LDLCALC No results found for: TRIG No results found for: CHOLHDL No results found for: LDLDIRECT    Radiology:  No results found.  EKG:  The tracing performed on 11/09/15 reveals atrial fibrillation, rapid ventricular response at 125 bpm, right bundle, and left anterior hemiblock.    Belva Crome III 11/12/2015 10:33 AM

## 2015-11-12 NOTE — Evaluation (Signed)
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 Hospital course also complicated by afib with RVR  Clinical Impression   Pt admitted with above diagnosis. Pt currently with functional limitations due to the deficits listed below (see PT Problem List). 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       Follow Up Recommendations SNF    Equipment Recommendations  Rolling walker with 5" wheels;3in1 (PT)    Recommendations for Other Services       Precautions / Restrictions Precautions Precautions: Fall;Other (comment) Precaution Comments: Neutropenic Precautions Restrictions Weight Bearing Restrictions: No      Mobility  Bed Mobility Overal bed mobility: Needs Assistance Bed Mobility: Rolling;Sidelying to Sit Rolling: Min assist Sidelying to sit: Mod assist       General bed mobility comments: Heavy mod assist to elevate trunk to sit; Effortful  Transfers Overall transfer level: Needs assistance Equipment used: 1 person hand held assist (and bilateral hold to gait belt) Transfers: Squat Pivot Transfers;Sit to/from Stand Sit to Stand: Max assist   Squat pivot transfers: Max assist     General transfer  comment: Profoundly weak and required Max assist and knee blocking for stability during transitions  Ambulation/Gait             General Gait Details: Unable today  Stairs            Wheelchair Mobility    Modified Rankin (Stroke Patients Only)       Balance Overall balance assessment: Needs assistance   Sitting balance-Leahy Scale: Fair       Standing balance-Leahy Scale: Zero                               Pertinent Vitals/Pain Pain Assessment: Faces Faces Pain Scale: Hurts even more Pain Location: Back pain; per daughter pt is in chronic pain due to spinal stenosis; Notable grimace, though, which pt attributes to some pain, but more the effort of moving Pain Descriptors / Indicators: Aching Pain Intervention(s): Monitored during session;Repositioned    Home Living Family/patient expects to be discharged to:: Skilled nursing facility                 Additional Comments: Plans to go to SNF section of [facility]    Prior Function Level of Independence: Independent with assistive device(s)         Comments: was independent with RW (had recently transitioned from cane to RW) and still driving until recent diagnosis of Multiple Myeloma     Hand Dominance        Extremity/Trunk Assessment   Upper Extremity Assessment: Generalized weakness           Lower Extremity Assessment: Generalized weakness (grossly 3-/5 throughout)  Communication   Communication: HOH  Cognition Arousal/Alertness: Awake/alert Behavior During Therapy: WFL for tasks assessed/performed Overall Cognitive Status: Within Functional Limits for tasks assessed                      General Comments General comments (skin integrity, edema, etc.): session conducted on 2 L O2; Sats greater than or equal to 95%    Exercises        Assessment/Plan    PT Assessment Patient needs continued PT services  PT Diagnosis Generalized  weakness;Difficulty walking   PT Problem List Decreased strength;Decreased activity tolerance;Decreased balance;Decreased mobility;Decreased coordination;Decreased knowledge of use of DME;Pain;Cardiopulmonary status limiting activity  PT Treatment Interventions DME instruction;Gait training;Functional mobility training;Therapeutic activities;Therapeutic exercise;Balance training;Neuromuscular re-education;Patient/family education   PT Goals (Current goals can be found in the Care Plan section) Acute Rehab PT Goals Patient Stated Goal: wants to get stronger PT Goal Formulation: With patient Time For Goal Achievement: 11/26/15 Potential to Achieve Goals: Good    Frequency Min 3X/week   Barriers to discharge        Co-evaluation               End of Session Equipment Utilized During Treatment: Gait belt;Oxygen Activity Tolerance: Patient limited by fatigue Patient left: in chair;with call bell/phone within reach;with family/visitor present Nurse Communication: Mobility status         Time: 2811-8867 PT Time Calculation (min) (ACUTE ONLY): 34 min   Charges:   PT Evaluation $PT Eval Moderate Complexity: 1 Procedure PT Treatments $Therapeutic Activity: 8-22 mins   PT G Codes:        Quin Hoop 11/12/2015, 1:10 PM  Roney Marion, PT  Acute Rehabilitation Services Pager [phone] Office [phone]

## 2015-11-12 NOTE — Progress Notes (Addendum)
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  Pharmacy has been consulted for Vancomycin/Zosn dosing. Pt recently diagnosed with multiple myeloma. Pt is hypotensive and has fever.  Random level was checked this AM after one dose to make sure the level wasn't elevated, random level was 9, ok to continue at current dose, no need to increase dose yet as not at steady state  Plan: -Continue Vancomycin 750 mg IV q48h -Zosyn 2.25g IV q6h -Trend labs, temp, renal function  -Check vancomycin trough at steady state if patient continues on vancomycin that long  Temp (24hrs), Avg:97.9 F (36.6 C), Min:97.8 F (36.6 C), Max:98.3 F (36.8 C)   Recent Labs Lab 11/08/15 0328  11/08/15 1505 11/09/15 0208 11/09/15 1950 11/10/15 0212 11/10/15 0620 11/11/15 0449 11/12/15 0159  WBC  --   < > 1.5* 0.9*  --  1.0*  --  1.6* 1.3*  CREATININE  --   < >  --  2.43* 2.23* 2.21*  --  2.38* 2.33*  LATICACIDVEN 1.67  --   --   --   --   --  1.5  --   --   VANCOTROUGH  --   --   --   --   --   --   --   --  9*  < > = values in this interval not displayed.  Estimated Creatinine Clearance: 18.5 mL/min (by C-G formula based on SCr of 2.33 mg/dL).    Allergies  Allergen Reactions  . Meperidine Hcl Other (See Comments)    REACTION: intol---causes nausea  . Insulins Other (See Comments)    No allergy but patient does NOT want any insulin therapy at this time. He discussed this with MD on 05/18/14    Narda Bonds 11/12/2015 4:29 AM

## 2015-11-12 NOTE — Progress Notes (Signed)
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 Otherwise he is feeling already denies fever or any new complaints.    Objective:  Vital signs in last 24 hours: Vitals:   11/11/15 2115 11/12/15 0512 11/12/15 0812 11/12/15 1103  BP: 122/64 134/81 118/82 112/73  Pulse: 96 (!) 103 (!) 53 86  Resp: '16 18 17 19  '$ Temp: 97.8 F (36.6 C) 97.9 F (36.6 C) 97.5 F (36.4 C)   TempSrc: Oral Oral Oral   SpO2: 97% 94% 99% (!) 87%  Weight:      Height:    '5\' 7"'$  (1.702 m)   24-hour weight change: Weight change:  Intake/Output:  09/03 0701 - 09/04 0700 In: 5047.9 [P.O.:900; I.V.:4147.9] Out: 7371 [Urine:1825]   Physical Exam  Constitutional: He is oriented to person, place, and time. He appears well-developed and well-nourished. No distress.  Cardiovascular: Regular rhythm.  Tachycardia present.   No murmur heard. Pulmonary/Chest: Effort normal. No respiratory distress. He has wheezes. He has no rales.  Upper airway wheeze   Abdominal: Soft. Bowel sounds are normal. He exhibits no distension. There is no tenderness. There is no guarding.  Neurological: He is alert and oriented to person, place, and time.  Extremities: no calf tenderness  Labs: CBC:  Recent Labs Lab 11/08/15 1505 11/09/15 0208 11/10/15 0212 11/11/15 0449 11/12/15 0159  WBC 1.5* 0.9* 1.0* 1.6* 1.3*  NEUTROABS 1.0* 0.6* 0.6* 0.9* 0.8*  HGB 9.3* 9.0* 9.0* 8.8* 8.6*  HCT 28.4* 26.8* 27.6* 27.1* 26.3*  MCV 95.3 94.4 96.8 96.1 97.0  PLT 55* 47* 36* 37* 31*   Metabolic Panel:  Recent Labs Lab 11/06/15 0855 11/08/15 0320  11/08/15 0423 11/09/15 0208 11/09/15 1950 11/10/15 0212 11/11/15 0449 11/12/15 0159  NA 139 QUESTIONABLE RESULTS, RECOMMEND RECOLLECT TO VERIFY  < > 136 137 133* 137 139 141  K 4.6 QUESTIONABLE RESULTS, RECOMMEND RECOLLECT TO VERIFY  < > 3.9 3.7 3.7 4.0 3.8 3.6  CL 104 QUESTIONABLE RESULTS, RECOMMEND RECOLLECT TO VERIFY  < > 105 107 108  113* 115* 117*  CO2 26 QUESTIONABLE RESULTS, RECOMMEND RECOLLECT TO VERIFY  --  19* 19* 18* 16* 16* 18*  GLUCOSE 135* QUESTIONABLE RESULTS, RECOMMEND RECOLLECT TO VERIFY  < > 150* 108* 176* 168* 159* 123*  BUN 44* QUESTIONABLE RESULTS, RECOMMEND RECOLLECT TO VERIFY  < > 48* 38* 36* 38* 47* 44*  CREATININE 2.17* QUESTIONABLE RESULTS, RECOMMEND RECOLLECT TO VERIFY  < > 2.55* 2.43* 2.23* 2.21* 2.38* 2.33*  CALCIUM 10.5* QUESTIONABLE RESULTS, RECOMMEND RECOLLECT TO VERIFY  --  9.0 8.0* 7.2* 7.5* 7.1* 6.9*  MG  --   --   --  1.4* 1.4*  --  2.1  --   --   PHOS  --   --   --  3.5 3.1  --  4.5  --   --   ALT 13* QUESTIONABLE RESULTS, RECOMMEND RECOLLECT TO VERIFY  --  11*  --   --   --   --   --   ALKPHOS 121 QUESTIONABLE RESULTS, RECOMMEND RECOLLECT TO VERIFY  --  82  --   --   --   --   --   BILITOT 2.6* QUESTIONABLE RESULTS, RECOMMEND RECOLLECT TO VERIFY  --  2.0*  --   --   --   --   --   PROT 7.6 QUESTIONABLE RESULTS, RECOMMEND RECOLLECT TO VERIFY  --  5.7*  --   --   --   --   --  ALBUMIN 3.2* QUESTIONABLE RESULTS, RECOMMEND RECOLLECT TO VERIFY  --  2.5* 2.1*  --   --   --   --   LABPROT  --  14.9  --   --   --   --   --   --   --   INR  --  1.17  --   --   --   --   --   --   --   < > = values in this interval not displayed. Cardiac Labs:  BG:  Recent Labs Lab 11/11/15 1623 11/11/15 2104 11/12/15 0013 11/12/15 0507 11/12/15 0808  GLUCAP 119* 138* 129* 126* 111*   No results found for: HGBA1C  Microbiology: Blood cultures drawn 8/31 no growth 4 days  Urine culture taken 8/31 no growth   Imaging: CT head 8/31  No acute intracranial findings. There is moderate generalized atrophy and chronic appearing white matter hypodensities which likely represent small vessel ischemic disease.  Xray lumbar spine 8/31 No acute fracture or subluxation of the lumbar spine. Severe degenerative disc disease, greatest at L5-S1.  Chest Xray 8/31 Cardiomegaly without pulmonary edema or focal  consolidation  Medications: Infusions: . sodium chloride 125 mL/hr at 11/12/15 0211   Scheduled Medications: . sodium chloride   Intravenous Once  . amiodarone  200 mg Oral BID  . ceFEPime (MAXIPIME) IV  2 g Intravenous Q24H  . dexamethasone  20 mg Oral Q Mon  . feeding supplement (GLUCERNA SHAKE)  237 mL Oral TID BM  . heparin subcutaneous  5,000 Units Subcutaneous Q12H  . linagliptin  5 mg Oral Daily  . metoprolol tartrate  25 mg Oral BID  . pantoprazole  40 mg Oral QODAY  . sodium chloride  500 mL Intravenous Once   And  . sodium chloride  250 mL Intravenous Once  . tamsulosin  0.4 mg Oral QPC supper   PRN Medications: sodium chloride, levalbuterol  Assessment/Plan: Pt is a 82 y.o. yo male with a PMHx of multiple myeloma, CKD stage III, prostate cancer, spinal stenosis, supraventricular tachycardia, COPD and diabetes mellitus who was admitted on 11/08/2015 with symptoms of altered mental status, which was determined to be secondary to neutropenic fever.   Active Problems:   Septic shock (HCC)  Neutropenic fever He was experiencing multiple episodes of loose diarrhea overnight. C. Difficile testing resulted negative. This area is most likely antibiotic related so Vanc and zosyn were switched to cefepime for a 7 day total course for coverage of gram negative rods.  -continues cefepime with stop date 9/7 (he has received 5/7 day total course of empiric antibiotic treatment)  - isolation precautions  - follow up CBC   Acute kidney injury Crt 2.3 with baseline 1.80. Improving with IV fluids. Will continue IV fluids.  -follow up BMET   Multiple myeloma Hematology is following  -continue Decadron 20 mg weekly, holding revlimid for now   Atrial fibrillation with RVR rates 100s. Cardiology is following.  -Metoprolol '25mg'$  BID for rate control  -Added amiodarone '200mg'$  BID for rhythm control   COPD PFT 2010 FEV1/FVC 66%  -Continue levalbuterol nebs PRN wheeze   DVT  prophylaxis Heparin at decreased dose per pharmacology consult   Dispo: Anticipated discharge in approximately 2-4 day(s).   LOS: 4 days   Paul Noss, MD 11/12/2015, 12:16 PM Pager: [phone]

## 2015-11-13 ENCOUNTER — Other Ambulatory Visit: Payer: Medicare Other

## 2015-11-13 DIAGNOSIS — I480 Paroxysmal atrial fibrillation: Secondary | ICD-10-CM

## 2015-11-13 DIAGNOSIS — Z79899 Other long term (current) drug therapy: Secondary | ICD-10-CM

## 2015-11-13 LAB — CBC WITH DIFFERENTIAL/PLATELET
BASOS ABS: 0 10*3/uL (ref 0.0–0.1)
Basophils Relative: 0 %
Eosinophils Absolute: 0 10*3/uL (ref 0.0–0.7)
Eosinophils Relative: 0 %
HCT: 25.2 % — ABNORMAL LOW (ref 39.0–52.0)
Hemoglobin: 8.4 g/dL — ABNORMAL LOW (ref 13.0–17.0)
LYMPHS ABS: 0.3 10*3/uL — AB (ref 0.7–4.0)
Lymphocytes Relative: 37 %
MCH: 32.1 pg (ref 26.0–34.0)
MCHC: 33.3 g/dL (ref 30.0–36.0)
MCV: 96.2 fL (ref 78.0–100.0)
MONO ABS: 0 10*3/uL — AB (ref 0.1–1.0)
Monocytes Relative: 4 %
NEUTROS PCT: 59 %
Neutro Abs: 0.4 10*3/uL — ABNORMAL LOW (ref 1.7–7.7)
PLATELETS: 25 10*3/uL — AB (ref 150–400)
RBC: 2.62 MIL/uL — AB (ref 4.22–5.81)
RDW: 20.8 % — AB (ref 11.5–15.5)
WBC: 0.7 10*3/uL — AB (ref 4.0–10.5)

## 2015-11-13 LAB — BASIC METABOLIC PANEL
ANION GAP: 6 (ref 5–15)
BUN: 42 mg/dL — ABNORMAL HIGH (ref 6–20)
CALCIUM: 6.5 mg/dL — AB (ref 8.9–10.3)
CO2: 17 mmol/L — ABNORMAL LOW (ref 22–32)
Chloride: 118 mmol/L — ABNORMAL HIGH (ref 101–111)
Creatinine, Ser: 2.12 mg/dL — ABNORMAL HIGH (ref 0.61–1.24)
GFR, EST AFRICAN AMERICAN: 30 mL/min — AB (ref >60)
GFR, EST NON AFRICAN AMERICAN: 26 mL/min — AB (ref >60)
GLUCOSE: 165 mg/dL — AB (ref 65–99)
POTASSIUM: 4.9 mmol/L (ref 3.5–5.1)
SODIUM: 141 mmol/L (ref 135–145)

## 2015-11-13 LAB — GLUCOSE, CAPILLARY
GLUCOSE-CAPILLARY: 162 mg/dL — AB (ref 65–99)
GLUCOSE-CAPILLARY: 130 mg/dL — AB (ref 65–99)
Glucose-Capillary: 158 mg/dL — ABNORMAL HIGH (ref 65–99)
Glucose-Capillary: 124 mg/dL — ABNORMAL HIGH (ref 65–99)
Glucose-Capillary: 132 mg/dL — ABNORMAL HIGH (ref 65–99)

## 2015-11-13 LAB — CULTURE, BLOOD (ROUTINE X 2)
CULTURE: NO GROWTH
Culture: NO GROWTH

## 2015-11-13 NOTE — Progress Notes (Signed)
Patient Name: [name], MD Date of Encounter: 11/13/2015  Hospital Problem List     Active Problems:   Septic shock ([place])   Febrile neutropenia (HCC)    Subjective   C/o watery stools during the night. No chest pain or dyspnea.  Inpatient Medications    . sodium chloride   Intravenous Once  . amiodarone  200 mg Oral BID  . ceFEPime (MAXIPIME) IV  2 g Intravenous Q24H  . dexamethasone  20 mg Oral Q Mon  . feeding supplement (GLUCERNA SHAKE)  237 mL Oral TID BM  . heparin subcutaneous  5,000 Units Subcutaneous Q12H  . linagliptin  5 mg Oral Daily  . metoprolol tartrate  25 mg Oral BID  . pantoprazole  40 mg Oral QODAY  . sodium chloride  500 mL Intravenous Once   And  . sodium chloride  250 mL Intravenous Once  . tamsulosin  0.4 mg Oral QPC supper    Vital Signs    Vitals:   11/12/15 1931 11/12/15 2138 11/13/15 0603 11/13/15 0930  BP: 117/70 121/76 117/76 123/76  Pulse: 94 80 84 84  Resp: '18  18 16  '$ Temp: 97.8 F (36.6 C)  98.1 F (36.7 C) 97.7 F (36.5 C)  TempSrc: Oral  Oral Oral  SpO2: 100%  100% 98%  Weight:      Height:        Intake/Output Summary (Last 24 hours) at 11/13/15 1025 Last data filed at 11/13/15 0900  Gross per 24 hour  Intake          4445.41 ml  Output             1575 ml  Net          2870.41 ml   Filed Weights   11/09/15 0500 11/10/15 0453  Weight: 133 lb 9.6 oz (60.6 kg) 133 lb 13.1 oz (60.7 kg)    Physical Exam    General: Pleasant frail ill appearing older male, NAD. Neuro: Alert and oriented X 3. Moves all extremities spontaneously. Psych: Normal affect. HEENT:  Normal  Neck: Supple without bruits or JVD. Lungs:  Resp regular and unlabored, CTA. Heart: RRR no s3, s4, or murmurs. Abdomen: Soft, non-tender, non-distended, BS + x 4.  Extremities: No clubbing, cyanosis or edema. DP/PT/Radials 2+ and equal bilaterally.  Labs    CBC  Recent Labs  11/12/15 0159 11/13/15 0513  WBC 1.3* 0.7*  NEUTROABS 0.8* 0.4*    HGB 8.6* 8.4*  HCT 26.3* 25.2*  MCV 97.0 96.2  PLT 31* 25*   Basic Metabolic Panel  Recent Labs  11/12/15 0159 11/13/15 0513  NA 141 141  K 3.6 4.9  CL 117* 118*  CO2 18* 17*  GLUCOSE 123* 165*  BUN 44* 42*  CREATININE 2.33* 2.12*  CALCIUM 6.9* 6.5*   Liver Function Tests No results for input(s): AST, ALT, ALKPHOS, BILITOT, PROT, ALBUMIN in the last 72 hours. No results for input(s): LIPASE, AMYLASE in the last 72 hours. Cardiac Enzymes No results for input(s): CKTOTAL, CKMB, CKMBINDEX, TROPONINI in the last 72 hours. BNP Invalid input(s): POCBNP D-Dimer No results for input(s): DDIMER in the last 72 hours. Hemoglobin A1C No results for input(s): HGBA1C in the last 72 hours. Fasting Lipid Panel No results for input(s): CHOL, HDL, LDLCALC, TRIG, CHOLHDL, LDLDIRECT in the last 72 hours. Thyroid Function Tests No results for input(s): TSH, T4TOTAL, T3FREE, THYROIDAB in the last 72 hours.  Invalid input(s): [employer]  Telemetry  A-Fib rates better controlled.  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 Most recent addition is multiple myeloma and initiation of chemotherapy. He was admitted to the hospital with sepsis and hypotension. He has a history of coronary artery disease, previous PCI, AV conduction system disease (trifascicular block), PSVT, hypertension, and stage III chronic kidney disease.  1. Atrial fibrillation with RVR: new onset, and likely acute illness related. The RVR is concerning because it may precipitate ischemic complications given his history of underlying CAD.  -- Was started on low dose amiodarone yesterday with the hope of spontaneous conversion to SR. Remains in a-fib but rate is now better controlled. --  Remains on low dose BB. Amio not for long term therapy given the risk of high-grade AV block. Not currently a candidate for anticoagulation  therapy because of bleeding risk.   2. Multiple myeloma with chemotherapy related neutropenia and sepsis  3. Sepsis and hypotension: resolved  4. Coronary artery disease with prior remote multivessel PCI  5. History of PSVT: None noted on telemetry. Continue BB.  6. 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  7. Pancytopenia  8. Stage III/for CKD  Signed, Reino Bellis NP-C Pager [phone]  Patient examined chart reviewed. Dr [name] in good spirits Afib with good rate control No anticoagulation due to cytopenia.  CAD stabel with no chest pain Continue beta blocker Exam noted for being elderly and pale  Jenkins Rouge

## 2015-11-13 NOTE — Progress Notes (Signed)
Internal Medicine Attending:   I saw and examined the patient. I reviewed the resident's note and I agree with the resident's findings and plan as documented in the resident's note. 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 For his neutropenic sepsis he is currently treated with cefepime and will complete another day of antibiotics. For his paroxysmal A. fib he is currently rate controlled with metoprolol and treated with amlodipine due to his pancytopenia we are not fully anticoagulating. We are monitoring his thrombocytopenia with the help of hematology pharmacy we are currently providing DVT prophylaxis cautiously with subcutaneous heparin. He has had no bleeding.,

## 2015-11-13 NOTE — Progress Notes (Signed)
IP PROGRESS NOTE  Subjective:  The diarrhea has resolved. The diarrhea has resolved. No bleeding. Objective: Vital signs in last 24 hours: Blood pressure 123/76, pulse 84, temperature 97.7 F (36.5 C), temperature source Oral, resp. rate 16, height 5' 7" (1.702 m), weight 133 lb 13.1 oz (60.7 kg), SpO2 98 %.  Intake/Output from previous day: 09/04 0701 - 09/05 0700 In: 4445.4 [P.O.:960; I.V.:3485.4] Out: 1850 [Urine:1850]  Physical Exam:  HEENT: No thrush or bleeding  Abdomen: Soft and nontender, no splenomegaly Extremities: No leg edema Neurologic: Alert, follows commands   Lab Results:  Recent Labs  11/12/15 0159 11/13/15 0513  WBC 1.3* 0.7*  HGB 8.6* 8.4*  HCT 26.3* 25.2*  PLT 31* 25*    BMET  Recent Labs  11/12/15 0159 11/13/15 0513  NA 141 141  K 3.6 4.9  CL 117* 118*  CO2 18* 17*  GLUCOSE 123* 165*  BUN 44* 42*  CREATININE 2.33* 2.12*  CALCIUM 6.9* 6.5*    Studies/Results: No results found.  Medications: I have reviewed the patient's current medications.  Assessment/Plan:  1. Multiple myeloma, IgA kappa, currently on treatment with weekly Decadron. Revlimid on hold secondary to pancytopenia  2. Admission 11/08/2015 sepsis syndrome, improved, cultures remain negative to date  3. Pancytopenia secondary to multiple myeloma and sepsis-stable  4. COPD  5.  Atrial fibrillation  6.  Acute/Chronic renal failure  7.   Diarrhea-likely secondary to ask, nutrition supplements  8.   History of hypercalcemia-improved following Zometa 11/07/2015  He remains afebrile. There is persistent pancytopenia. The sepsis syndrome has resolved. Cultures remain negative. I discussed treatment options with Dr. [name]. He understands the cytopenias are secondary to myeloma, Revlimid, and potentially the episode of sepsis. Revlimid will remain on hold. It is possible the persistent neutropenia/thrombocytopenia is related to progression of myeloma. We will consider  switching to Velcade-based therapy if his counts do not improve this week.  Recommendations: 1. Continue weekly Decadron, hold Revlimid 2. Complete at least 7 days of broad-spectrum antibiotics 3. Transfuse platelets for a count of less than 10,000 or bleeding 4. Ambulate, SCDs, I would hold heparin while the platelet count is this low.     LOS: 5 days   Betsy Coder, MD   11/13/2015, 1:56 PM

## 2015-11-13 NOTE — Progress Notes (Signed)
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 His diarrhea has improved, he had only 2 episodes overnight with stopping antibiotics and starting pepto bismol.   Objective:  Vital signs in last 24 hours: Vitals:   11/12/15 1624 11/12/15 1931 11/12/15 2138 11/13/15 0603  BP: 105/82 117/70 121/76 117/76  Pulse: 87 94 80 84  Resp: '18 18  18  '$ Temp: 97.7 F (36.5 C) 97.8 F (36.6 C)  98.1 F (36.7 C)  TempSrc: Oral Oral  Oral  SpO2: 100% 100%  100%  Weight:      Height:       Physical Exam  Constitutional: He appears well-developed and well-nourished. No distress.  Cardiovascular: Normal rate.  An irregularly irregular rhythm present.  No murmur heard. Pulmonary/Chest: Effort normal. No respiratory distress. He has no wheezes. He has no rales.  Abdominal: Soft. He exhibits no distension. There is no tenderness.  Neurological: He is alert.  Extremities: no peripheral edema   Labs: CBC:  Recent Labs Lab 11/09/15 0208 11/10/15 0212 11/11/15 0449 11/12/15 0159 11/13/15 0513  WBC 0.9* 1.0* 1.6* 1.3* 0.7*  NEUTROABS 0.6* 0.6* 0.9* 0.8* 0.4*  HGB 9.0* 9.0* 8.8* 8.6* 8.4*  HCT 26.8* 27.6* 27.1* 26.3* 25.2*  MCV 94.4 96.8 96.1 97.0 96.2  PLT 47* 36* 37* 31* 25*   Metabolic Panel:  Recent Labs Lab 11/06/15 0855 11/08/15 0320  11/08/15 0423 11/09/15 0208 11/09/15 1950 11/10/15 0212 11/11/15 0449 11/12/15 0159 11/13/15 0513  NA 139 QUESTIONABLE RESULTS, RECOMMEND RECOLLECT TO VERIFY  < > 136 137 133* 137 139 141 141  K 4.6 QUESTIONABLE RESULTS, RECOMMEND RECOLLECT TO VERIFY  < > 3.9 3.7 3.7 4.0 3.8 3.6 4.9  CL 104 QUESTIONABLE RESULTS, RECOMMEND RECOLLECT TO VERIFY  < > 105 107 108 113* 115* 117* 118*  CO2 26 QUESTIONABLE RESULTS, RECOMMEND RECOLLECT TO VERIFY  --  19* 19* 18* 16* 16* 18* 17*  GLUCOSE 135* QUESTIONABLE RESULTS, RECOMMEND RECOLLECT TO VERIFY  < > 150* 108* 176* 168* 159* 123* 165*  BUN 44* QUESTIONABLE RESULTS, RECOMMEND  RECOLLECT TO VERIFY  < > 48* 38* 36* 38* 47* 44* 42*  CREATININE 2.17* QUESTIONABLE RESULTS, RECOMMEND RECOLLECT TO VERIFY  < > 2.55* 2.43* 2.23* 2.21* 2.38* 2.33* 2.12*  CALCIUM 10.5* QUESTIONABLE RESULTS, RECOMMEND RECOLLECT TO VERIFY  --  9.0 8.0* 7.2* 7.5* 7.1* 6.9* 6.5*  MG  --   --   --  1.4* 1.4*  --  2.1  --   --   --   PHOS  --   --   --  3.5 3.1  --  4.5  --   --   --   ALT 13* QUESTIONABLE RESULTS, RECOMMEND RECOLLECT TO VERIFY  --  11*  --   --   --   --   --   --   ALKPHOS 121 QUESTIONABLE RESULTS, RECOMMEND RECOLLECT TO VERIFY  --  82  --   --   --   --   --   --   BILITOT 2.6* QUESTIONABLE RESULTS, RECOMMEND RECOLLECT TO VERIFY  --  2.0*  --   --   --   --   --   --   PROT 7.6 QUESTIONABLE RESULTS, RECOMMEND RECOLLECT TO VERIFY  --  5.7*  --   --   --   --   --   --   ALBUMIN 3.2* QUESTIONABLE RESULTS, RECOMMEND RECOLLECT TO VERIFY  --  2.5* 2.1*  --   --   --   --   --  LABPROT  --  14.9  --   --   --   --   --   --   --   --   INR  --  1.17  --   --   --   --   --   --   --   --   < > = values in this interval not displayed. Cardiac Labs:  Recent Labs Lab 11/08/15 0327 11/08/15 1259 11/08/15 2039 11/09/15 0109  TROPIPOC 0.17*  --   --   --   TROPONINI  --  0.47* 0.68* 0.68*   BG:  Recent Labs Lab 11/12/15 1223 11/12/15 1622 11/12/15 2050 11/12/15 2357 11/13/15 0418  GLUCAP 147* 164* 199* 183* 162*   No results found for: HGBA1C  Microbiology: Blood cultures drawn 8/31 no growth 4 days  Urine culture taken 8/31 no growth   Imaging: CT head 8/31  No acute intracranial findings. There is moderate generalized atrophy and chronic appearing white matter hypodensities which likely represent small vessel ischemic disease.  Xray lumbar spine 8/31 No acute fracture or subluxation of the lumbar spine. Severe degenerative disc disease, greatest at L5-S1.  Chest Xray 8/31 Cardiomegaly without pulmonary edema or focal consolidation   Medications: Infusions: . sodium chloride 125 mL/hr at 11/13/15 0004   Scheduled Medications: . sodium chloride   Intravenous Once  . amiodarone  200 mg Oral BID  . ceFEPime (MAXIPIME) IV  2 g Intravenous Q24H  . dexamethasone  20 mg Oral Q Mon  . feeding supplement (GLUCERNA SHAKE)  237 mL Oral TID BM  . heparin subcutaneous  5,000 Units Subcutaneous Q12H  . linagliptin  5 mg Oral Daily  . metoprolol tartrate  25 mg Oral BID  . pantoprazole  40 mg Oral QODAY  . sodium chloride  500 mL Intravenous Once   And  . sodium chloride  250 mL Intravenous Once  . tamsulosin  0.4 mg Oral QPC supper   PRN Medications: sodium chloride, bismuth subsalicylate, levalbuterol, Melatonin  Assessment/Plan: Pt is a 82 y.o. yo male with a PMHx of multiple myeloma, CKD stage III, prostate cancer, spinal stenosis, supraventricular tachycardia, COPD and diabetes mellitus who was admitted on 11/08/2015 with symptoms of altered mental status, which was determined to be secondary to neutropenic fever.   Active Problems:   Septic shock (HCC)  Neutropenic feverHe remains afebrile. Continue renally dosed cefepime for a 7 day total course for coverage of gram negative rods.  -continues cefepime with stop date 9/7 (he has received 6/7 day total course of empiric antibiotic treatment)  - isolation precautions  - follow up CBC   Atrial fibrillation with RVR rates 100s. Cardiology is following and we appreciate their recommendations.   -Metoprolol '25mg'$  BID for rate control  -Added amiodarone '200mg'$  BID for rhythm control   Acute kidney injuryCrt 2.1 with baseline 1.80. Improving with IV fluids. Will continue gently IV fluid hydration.  -follow up BMET  -avoid nephrotoxic agents   Multiple myelomaHematology is following  -continue Decadron 20 mg weekly, holding revlimid for now   COPD PFT 2010 FEV1/FVC 66%  -Continue levalbuterol nebs PRN wheeze   DVT prophylaxis Heparin at decreased dose per  pharmacology consult   PT recommends SNF stay and family has been communicating with [facility]  Dispo: Anticipated discharge in approximately 2-4 day(s).   LOS: 5 days   Ledell Noss, MD 11/13/2015, 7:21 AM Pager: [phone]

## 2015-11-13 NOTE — Care Management Important Message (Signed)
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

## 2015-11-14 ENCOUNTER — Inpatient Hospital Stay (HOSPITAL_COMMUNITY): Admission: RE | Admit: 2015-11-14 | Payer: Medicare Other | Source: Ambulatory Visit

## 2015-11-14 DIAGNOSIS — G4700 Insomnia, unspecified: Secondary | ICD-10-CM

## 2015-11-14 DIAGNOSIS — E1122 Type 2 diabetes mellitus with diabetic chronic kidney disease: Secondary | ICD-10-CM

## 2015-11-14 DIAGNOSIS — N183 Chronic kidney disease, stage 3 (moderate): Secondary | ICD-10-CM

## 2015-11-14 DIAGNOSIS — I480 Paroxysmal atrial fibrillation: Secondary | ICD-10-CM

## 2015-11-14 LAB — CBC WITH DIFFERENTIAL/PLATELET
BASOS ABS: 0 10*3/uL (ref 0.0–0.1)
Basophils Relative: 0 %
EOS PCT: 3 %
Eosinophils Absolute: 0 10*3/uL (ref 0.0–0.7)
HEMATOCRIT: 25.4 % — AB (ref 39.0–52.0)
HEMOGLOBIN: 8.1 g/dL — AB (ref 13.0–17.0)
LYMPHS ABS: 0.3 10*3/uL — AB (ref 0.7–4.0)
LYMPHS PCT: 42 %
MCH: 31.4 pg (ref 26.0–34.0)
MCHC: 31.9 g/dL (ref 30.0–36.0)
MCV: 98.4 fL (ref 78.0–100.0)
MONOS PCT: 1 %
Monocytes Absolute: 0 10*3/uL — ABNORMAL LOW (ref 0.1–1.0)
NEUTROS ABS: 0.4 10*3/uL — AB (ref 1.7–7.7)
Neutrophils Relative %: 54 %
Platelets: 22 10*3/uL — CL (ref 150–400)
RBC: 2.58 MIL/uL — AB (ref 4.22–5.81)
RDW: 20.9 % — AB (ref 11.5–15.5)
WBC: 0.7 10*3/uL — CL (ref 4.0–10.5)

## 2015-11-14 LAB — BASIC METABOLIC PANEL
ANION GAP: 12 (ref 5–15)
BUN: 36 mg/dL — ABNORMAL HIGH (ref 6–20)
CHLORIDE: 113 mmol/L — AB (ref 101–111)
CO2: 16 mmol/L — AB (ref 22–32)
Calcium: 6.4 mg/dL — CL (ref 8.9–10.3)
Creatinine, Ser: 1.84 mg/dL — ABNORMAL HIGH (ref 0.61–1.24)
GFR calc non Af Amer: 31 mL/min — ABNORMAL LOW (ref >60)
GFR, EST AFRICAN AMERICAN: 36 mL/min — AB (ref >60)
GLUCOSE: 123 mg/dL — AB (ref 65–99)
Potassium: 3.6 mmol/L (ref 3.5–5.1)
Sodium: 141 mmol/L (ref 135–145)

## 2015-11-14 LAB — MAGNESIUM: Magnesium: 2 mg/dL (ref 1.7–2.4)

## 2015-11-14 MED ORDER — TRAZODONE HCL 50 MG PO TABS
25.0000 mg | ORAL_TABLET | Freq: Once | ORAL | Status: AC | PRN
  Administered 2015-11-14: 25 mg via ORAL
  Filled 2015-11-14: qty 1

## 2015-11-14 MED ORDER — LORAZEPAM 0.5 MG PO TABS
0.5000 mg | ORAL_TABLET | Freq: Every day | ORAL | Status: DC
  Administered 2015-11-14 – 2015-11-16 (×3): 0.5 mg via ORAL
  Filled 2015-11-14 (×3): qty 1

## 2015-11-14 MED ORDER — VITAMIN D 1000 UNITS PO TABS
1000.0000 [IU] | ORAL_TABLET | Freq: Every day | ORAL | Status: DC
  Administered 2015-11-14 – 2015-11-16 (×3): 1000 [IU] via ORAL
  Filled 2015-11-14 (×4): qty 1

## 2015-11-14 MED ORDER — LEVALBUTEROL HCL 0.63 MG/3ML IN NEBU
0.6300 mg | INHALATION_SOLUTION | Freq: Three times a day (TID) | RESPIRATORY_TRACT | Status: DC | PRN
  Administered 2015-11-15 – 2015-11-16 (×2): 0.63 mg via RESPIRATORY_TRACT
  Filled 2015-11-14 (×2): qty 3

## 2015-11-14 MED ORDER — SODIUM CHLORIDE 0.9 % IV SOLN
1.0000 g | Freq: Once | INTRAVENOUS | Status: AC
  Administered 2015-11-14: 1 g via INTRAVENOUS
  Filled 2015-11-14: qty 10

## 2015-11-14 NOTE — NC FL2 (Signed)
[place] LEVEL OF CARE SCREENING TOOL     IDENTIFICATION  Patient Name: Paul Baller, MD Birthdate: 12-Nov-1924 Sex: male Admission Date (Current Location): 11/08/2015  [facility] and [place] Number:  Herbalist and Address:  [facility], [place] [address] [place], [place] [address]      Provider Number: [id number]  Attending Physician Name and Address:  Lucious Groves, DO  Relative Name and Phone Number:  Dr. Elsie Saas, son, [phone]    Current Level of Care: Hospital Recommended Level of Care: [place] Prior Approval Number:    Date Approved/Denied:   PASRR Number: [id number]  Discharge Plan: SNF    Current Diagnoses: Patient Active Problem List   Diagnosis Date Noted  . PAF (paroxysmal atrial fibrillation) ([place]) 11/13/2015  . Febrile neutropenia ([place])   . Septic shock ([place]) 11/08/2015  . Arterial hypotension   . Sepsis ([place])   . CKD (chronic kidney disease) stage 4, GFR 15-29 ml/min (HCC) 11/06/2015  . Multiple myeloma not having achieved remission ([place]) 10/11/2015  . Pancytopenia ([place]) 10/03/2015  . SVT (supraventricular tachycardia) ([place]) 05/17/2014  . COPD (chronic obstructive pulmonary disease) ([place]) 05/17/2014  . CKD (chronic kidney disease), stage III 05/17/2014  . Other specified fever   . MGUS (monoclonal gammopathy of unknown significance) 01/31/2014  . PROSTATE CANCER 07/19/2008  . DIABETES MELLITUS 07/19/2008  . Essential hypertension 07/19/2008  . Coronary atherosclerosis 07/19/2008  . Secondary pulmonary hypertension ([place]) 07/19/2008  . COPD 07/19/2008  . DYSPNEA ON EXERTION 07/19/2008    Orientation RESPIRATION BLADDER Height & Weight     Self, Situation, Time, Place  Normal Continent Weight: 69 kg (152 lb 1.9 oz) Height:  '5\' 7"'$  (170.2 cm) (recorded on 11/07/15 @ 09:00)  BEHAVIORAL SYMPTOMS/MOOD NEUROLOGICAL BOWEL NUTRITION STATUS      Continent Diet (Please see DC  Summary)  AMBULATORY STATUS COMMUNICATION OF NEEDS Skin   Extensive Assist Verbally Normal                       Personal Care Assistance Level of Assistance  Bathing, Feeding, Dressing Bathing Assistance: Maximum assistance Feeding assistance: Independent Dressing Assistance: Limited assistance     Functional Limitations Info             SPECIAL CARE FACTORS FREQUENCY  PT (By licensed PT)     PT Frequency: 5x/week              Contractures      Additional Factors Info  Code Status, Allergies, Psychotropic Code Status Info: DNR Allergies Info: Meperidine Hcl, Insulins Psychotropic Info: Ativan         Current Medications (11/14/2015):  This is the current hospital active medication list Current Facility-Administered Medications  Medication Dose Route Frequency Provider Last Rate Last Dose  . 0.9 %  sodium chloride infusion   Intravenous Continuous Ledell Noss, MD 75 mL/hr at 11/14/15 1118    . 0.9 %  sodium chloride infusion   Intravenous Once Kristen N Ward, DO      . 0.9 %  sodium chloride infusion  250 mL Intravenous PRN Javier Glazier, MD      . amiodarone (PACERONE) tablet 200 mg  200 mg Oral BID Belva Crome, MD   200 mg at 11/13/15 2128  . bismuth subsalicylate (PEPTO BISMOL) 262 MG/15ML suspension 30 mL  30 mL Oral Q4H PRN Alexa Angela Burke, MD   30 mL at  11/13/15 0014  . ceFEPIme (MAXIPIME) 2 g in dextrose 5 % 50 mL IVPB  2 g Intravenous Q24H Ledell Noss, MD   2 g at 11/13/15 1246  . dexamethasone (DECADRON) tablet 20 mg  20 mg Oral Q Mon Annia Belt, MD   20 mg at 11/12/15 0936  . feeding supplement (GLUCERNA SHAKE) (GLUCERNA SHAKE) liquid 237 mL  237 mL Oral TID BM Javier Glazier, MD   237 mL at 11/12/15 1400  . levalbuterol (XOPENEX) nebulizer solution 0.63 mg  0.63 mg Nebulization Q8H PRN Ledell Noss, MD      . linagliptin (TRADJENTA) tablet 5 mg  5 mg Oral Daily Annia Belt, MD   5 mg at 11/14/15 1012  . LORazepam (ATIVAN) tablet 0.5  mg  0.5 mg Oral QHS Ledell Noss, MD      . Melatonin TABS 4.5 mg  4.5 mg Oral QHS PRN Florinda Marker, MD   4.5 mg at 11/13/15 2129  . metoprolol tartrate (LOPRESSOR) tablet 25 mg  25 mg Oral BID Norman Herrlich, MD   25 mg at 11/14/15 1012  . pantoprazole (PROTONIX) EC tablet 40 mg  40 mg Oral QODAY Javier Glazier, MD   40 mg at 11/14/15 1012  . sodium chloride 0.9 % bolus 500 mL  500 mL Intravenous Once Kristen N Ward, DO       And  . sodium chloride 0.9 % bolus 250 mL  250 mL Intravenous Once Kristen N Ward, DO      . tamsulosin (FLOMAX) capsule 0.4 mg  0.4 mg Oral QPC supper Javier Glazier, MD   0.4 mg at 11/13/15 1759     Discharge Medications: Please see discharge summary for a list of discharge medications.  Relevant Imaging Results:  Relevant Lab Results:   Additional Information SSN: [place], [place]

## 2015-11-14 NOTE — Progress Notes (Signed)
Physical Therapy Treatment Patient Details Name: Paul MACLAY, MD MRN: [id number] DOB: 1924/11/17 Today's Date: 11/14/2015    History of Present Illness Dr. Mcgath is a 82 y.o. yo male with a PMHx of multiple myeloma, CKD stage III, prostate cancer, spinal stenosis, supraventricular tachycardia, COPD and diabetes mellitus who was admitted on 11/08/2015 with symptoms of weakness and altered mental status, which was determined to be secondary to neutropenic fever. Hospital course also complicated by afib with RVR    PT Comments    Making progress with functional mobility; Able to walk in room with 2 person assist for safety; Quite fatigued post walking bout  Follow Up Recommendations  SNF     Equipment Recommendations  Rolling walker with 5" wheels;3in1 (PT)    Recommendations for Other Services       Precautions / Restrictions Precautions Precautions: Fall;Other (comment) Precaution Comments: Neutropenic Precautions    Mobility  Bed Mobility Overal bed mobility: Needs Assistance Bed Mobility: Rolling;Sidelying to Sit Rolling: Min assist Sidelying to sit: Mod assist       General bed mobility comments: Heavy mod assist to elevate trunk to sit; Effortful  Transfers Overall transfer level: Needs assistance Equipment used: Rolling walker (2 wheeled) Transfers: Sit to/from Stand Sit to Stand: +2 physical assistance;Mod assist         General transfer comment: Heavy mod assist to power up; bilateral support given at elbows/shoulder girdle and gait belt; mod assist to control descent to sit  Ambulation/Gait Ambulation/Gait assistance: +2 safety/equipment;Mod assist Ambulation Distance (Feet): 8 Feet Assistive device: Rolling walker (2 wheeled) Gait Pattern/deviations: Decreased step length - right;Decreased step length - left;Trunk flexed;Narrow base of support;Shuffle     General Gait Details: Chair pushed behind for safety and to bolster pt's confidence with  walking; mod assist mainly for RW management; Pt reports he has been walking with trunk flexed due to spinal stenosis for a few years   Stairs            Wheelchair Mobility    Modified Rankin (Stroke Patients Only)       Balance     Sitting balance-Leahy Scale: Fair       Standing balance-Leahy Scale: Poor                      Cognition Arousal/Alertness: Awake/alert Behavior During Therapy: WFL for tasks assessed/performed Overall Cognitive Status: Within Functional Limits for tasks assessed                      Exercises      General Comments General comments (skin integrity, edema, etc.): Session conducted on Room Air and O2 sats were at 94% at end of session      Pertinent Vitals/Pain Pain Assessment: Faces Faces Pain Scale: Hurts even more Pain Location: Chronic back pain; states his grimace with moving is more effort than pain Pain Descriptors / Indicators: Aching Pain Intervention(s): Monitored during session;Repositioned    Home Living                      Prior Function            PT Goals (current goals can now be found in the care plan section) Acute Rehab PT Goals Patient Stated Goal: wants to get stronger; plans on Rehab at [facility] PT Goal Formulation: With patient Time For Goal Achievement: 11/26/15 Potential to Achieve Goals: Good Progress towards PT goals: Progressing toward goals  Frequency  Min 3X/week    PT Plan Current plan remains appropriate    Co-evaluation             End of Session Equipment Utilized During Treatment: Gait belt Activity Tolerance: Patient tolerated treatment well;Patient limited by fatigue Patient left: in chair;with call bell/phone within reach;with family/visitor present     Time: 0855-0916 PT Time Calculation (min) (ACUTE ONLY): 21 min  Charges:  $Gait Training: 8-22 mins                    G Codes:      Garrigan, Holly Hamff 11/14/2015, 10:01 AM  Holly  Garrigan, PT  Acute Rehabilitation Services Pager [phone] Office [phone]   

## 2015-11-14 NOTE — Progress Notes (Signed)
CRITICAL LAB VALUE CALCIUM: 6.4 On call notified.   Ermalinda Memos, RN

## 2015-11-14 NOTE — Progress Notes (Signed)
       Patient Name: [name], MD Date of Encounter: 11/14/2015    SUBJECTIVE: Feels better than previously. No diarrhea.  TELEMETRY:  A fibrillation with better rate control at ~85 bpm Vitals:   11/13/15 0930 11/13/15 1758 11/13/15 2018 11/14/15 0423  BP: 123/76 139/81 116/66 128/75  Pulse: 84 90 94 91  Resp: 16 18 18 18   Temp: 97.7 F (36.5 C) 97.5 F (36.4 C) 97.7 F (36.5 C) 98.1 F (36.7 C)  TempSrc: Oral Oral Oral Oral  SpO2: 98% 98% 96% 100%  Weight:   152 lb 1.9 oz (69 kg)   Height:        Intake/Output Summary (Last 24 hours) at 11/14/15 0736 Last data filed at 11/14/15 [id number]  Gross per 24 hour  Intake          2849.17 ml  Output              925 ml  Net          1924.17 ml   LABS: Basic Metabolic Panel:  Recent Labs  11/13/15 0513 11/14/15 0402 11/14/15 0554  NA 141 141  --   K 4.9 3.6  --   CL 118* 113*  --   CO2 17* 16*  --   GLUCOSE 165* 123*  --   BUN 42* 36*  --   CREATININE 2.12* 1.84*  --   CALCIUM 6.5* 6.4*  --   MG  --   --  2.0   CBC:  Recent Labs  11/13/15 0513 11/14/15 0402  WBC 0.7* 0.7*  NEUTROABS 0.4* PENDING  HGB 8.4* 8.1*  HCT 25.2* 25.4*  MCV 96.2 98.4  PLT 25* 22*   Cardiac Enzymes: No results for input(s): CKTOTAL, CKMB, CKMBINDEX, TROPONINI in the last 72 hours. BNP: Invalid input(s): POCBNP Hemoglobin A1C: No results for input(s): HGBA1C in the last 72 hours. Fasting Lipid Panel: No results for input(s): CHOL, HDL, LDLCALC, TRIG, CHOLHDL, LDLDIRECT in the last 72 hours.  Radiology/Studies:  No new data  Physical Exam: Blood pressure 128/75, pulse 91, temperature 98.1 F (36.7 C), temperature source Oral, resp. rate 18, height 5\' 7"  (1.702 m), weight 152 lb 1.9 oz (69 kg), SpO2 100 %. Weight change:   Wt Readings from Last 3 Encounters:  11/13/15 152 lb 1.9 oz (69 kg)  11/07/15 120 lb (54.4 kg)  10/22/15 120 lb (54.4 kg)   Frail and ill appearing. No respiratory distress. Lungs clear except  basilar rales. No murmur or rub on cardiac auscultation. No edema  ASSESSMENT:  1. Atrial fibrillation with RVR improved with beta blocker and amiodarone. 2. CAD, stable. 3. MM with pancytopenia.  Plan:  1. Continue current therapy. 2. At discharge DC the amiodarone. 3. If rate control issues as OP, will add Digoxin.  Signed, Belva Crome III 11/14/2015, 7:36 AM

## 2015-11-14 NOTE — Progress Notes (Signed)
Internal Medicine Attending:   I saw and examined the patient. I reviewed the resident's note and I agree with the resident's findings and plan as documented in the resident's note.   Dr [name], reported poor sleep, melatonin and trazodone were not effective last night.  Will try low dose of ativan tonight.  We are monitoring his pancytopenia and he will remain in the hospital.  Continue daily CBCs, transfuse platelets for counts <10k.  Today is day 7/7 of antibiotics.

## 2015-11-14 NOTE — Clinical Social Work Note (Signed)
Clinical Social Work Assessment  Patient Details  Name: Paul TEWS, MD MRN: [id number] Date of Birth: 06-Jul-1924  Date of referral:  11/14/15               Reason for consult:  Facility Placement                Permission sought to share information with:  Facility Sport and exercise psychologist, Family Supports Permission granted to share information::  Yes, Verbal Permission Granted  Name::     Dr. Elsie Saas  Agency::  [facility]  Relationship::  Son  Contact Information:  [phone]  Housing/Transportation Living arrangements for the past 2 months:  [place] of Information:  Patient Patient Interpreter Needed:  None Criminal Activity/Legal Involvement Pertinent to Current Situation/Hospitalization:  No - Comment as needed Significant Relationships:  Adult Children Lives with:  Self, Facility Resident Do you feel safe going back to the place where you live?  No Need for family participation in patient care:  No (Coment)  Care giving concerns:  CSW received referral for possible SNF placement at time of discharge. CSW met with patient regarding PT recommendation of SNF placement at time of discharge. Patient stated that he is from [place]. Patient stated he is currently unable to care for himself at home given patient's current physical needs and fall risk. Patient expressed understanding of PT recommendation and is agreeable to SNF placement at time of discharge. CSW to continue to follow and assist with discharge planning needs.   Social Worker assessment / plan:  CSW spoke with patient concerning possibility of rehab at [facility] before returning home.  Employment status:  Retired Forensic scientist:  Medicare PT Recommendations:  [place] / Referral to community resources:  [place]  Patient/Family's Response to care:  Patient recognizes need for rehab before returning home and is  agreeable to a SNF in [place]. Patient reported preference for [facility].  Patient/Family's Understanding of and Emotional Response to Diagnosis, Current Treatment, and Prognosis:  Patient/family is realistic regarding therapy needs and expressed being hopeful for SNF placement. No questions/concerns about plan or treatment.    Emotional Assessment Appearance:  Appears stated age Attitude/Demeanor/Rapport:  Other (Appropriate) Affect (typically observed):  Accepting, Appropriate, Pleasant Orientation:  Oriented to Self, Oriented to Place, Oriented to  Time, Oriented to Situation Alcohol / Substance use:  Not Applicable Psych involvement (Current and /or in the community):  No (Comment)  Discharge Needs  Concerns to be addressed:  Care Coordination Readmission within the last 30 days:  No Current discharge risk:  None Barriers to Discharge:  Continued Medical Work up   [employer], [place] 11/14/2015, 12:42 PM

## 2015-11-14 NOTE — Progress Notes (Addendum)
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 He denies fever, diarrhea, or shortness of breath. Will contact the patients son, Dr. [name] today to update on the plan [phone].  Objective:  Vital signs in last 24 hours: Vitals:   11/13/15 1758 11/13/15 2018 11/14/15 0423 11/14/15 0928  BP: 139/81 116/66 128/75 125/70  Pulse: 90 94 91 90  Resp: '18 18 18 18  '$ Temp: 97.5 F (36.4 C) 97.7 F (36.5 C) 98.1 F (36.7 C) 98 F (36.7 C)  TempSrc: Oral Oral Oral Oral  SpO2: 98% 96% 100% 100%  Weight:  152 lb 1.9 oz (69 kg)    Height:       Physical Exam  Constitutional: He is oriented to person, place, and time. He appears well-developed and well-nourished. No distress.  Cardiovascular: Normal rate.  An irregularly irregular rhythm present.  No murmur heard. Pulmonary/Chest: He has no wheezes. He has no rales.  Abdominal: Soft. Bowel sounds are normal. He exhibits no distension. There is no tenderness.  Neurological: He is alert and oriented to person, place, and time.  Extremities no calf tenderness. No peripheral edema  Labs: CBC:  Recent Labs Lab 11/10/15 0212 11/11/15 0449 11/12/15 0159 11/13/15 0513 11/14/15 0402  WBC 1.0* 1.6* 1.3* 0.7* 0.7*  NEUTROABS 0.6* 0.9* 0.8* 0.4* 0.4*  HGB 9.0* 8.8* 8.6* 8.4* 8.1*  HCT 27.6* 27.1* 26.3* 25.2* 25.4*  MCV 96.8 96.1 97.0 96.2 98.4  PLT 36* 37* 31* 25* 22*   Metabolic Panel:  Recent Labs Lab 11/08/15 0320  11/08/15 0423 11/09/15 8657  11/10/15 8469 11/11/15 0449 11/12/15 0159 11/13/15 0513 11/14/15 0402 11/14/15 0554  NA QUESTIONABLE RESULTS, RECOMMEND RECOLLECT TO VERIFY  < > 136 137  < > 137 139 141 141 141  --   K QUESTIONABLE RESULTS, RECOMMEND RECOLLECT TO VERIFY  < > 3.9 3.7  < > 4.0 3.8 3.6 4.9 3.6  --   CL QUESTIONABLE RESULTS, RECOMMEND RECOLLECT TO VERIFY  < > 105 107  < > 113* 115* 117* 118* 113*  --   CO2 QUESTIONABLE RESULTS, RECOMMEND  RECOLLECT TO VERIFY  --  19* 19*  < > 16* 16* 18* 17* 16*  --   GLUCOSE QUESTIONABLE RESULTS, RECOMMEND RECOLLECT TO VERIFY  < > 150* 108*  < > 168* 159* 123* 165* 123*  --   BUN QUESTIONABLE RESULTS, RECOMMEND RECOLLECT TO VERIFY  < > 48* 38*  < > 38* 47* 44* 42* 36*  --   CREATININE QUESTIONABLE RESULTS, RECOMMEND RECOLLECT TO VERIFY  < > 2.55* 2.43*  < > 2.21* 2.38* 2.33* 2.12* 1.84*  --   CALCIUM QUESTIONABLE RESULTS, RECOMMEND RECOLLECT TO VERIFY  --  9.0 8.0*  < > 7.5* 7.1* 6.9* 6.5* 6.4*  --   MG  --   --  1.4* 1.4*  --  2.1  --   --   --   --  2.0  PHOS  --   --  3.5 3.1  --  4.5  --   --   --   --   --   ALT QUESTIONABLE RESULTS, RECOMMEND RECOLLECT TO VERIFY  --  11*  --   --   --   --   --   --   --   --   ALKPHOS QUESTIONABLE RESULTS, RECOMMEND RECOLLECT TO VERIFY  --  82  --   --   --   --   --   --   --   --  BILITOT QUESTIONABLE RESULTS, RECOMMEND RECOLLECT TO VERIFY  --  2.0*  --   --   --   --   --   --   --   --   PROT QUESTIONABLE RESULTS, RECOMMEND RECOLLECT TO VERIFY  --  5.7*  --   --   --   --   --   --   --   --   ALBUMIN QUESTIONABLE RESULTS, RECOMMEND RECOLLECT TO VERIFY  --  2.5* 2.1*  --   --   --   --   --   --   --   LABPROT 14.9  --   --   --   --   --   --   --   --   --   --   INR 1.17  --   --   --   --   --   --   --   --   --   --   < > = values in this interval not displayed. Cardiac Labs:  Recent Labs Lab 11/08/15 0327 11/08/15 1259 11/08/15 2039 11/09/15 0109  TROPIPOC 0.17*  --   --   --   TROPONINI  --  0.47* 0.68* 0.68*   BG:  Recent Labs Lab 11/13/15 0418 11/13/15 0811 11/13/15 1233 11/13/15 1659 11/13/15 2024  GLUCAP 162* 130* 158* 124* 132*   No results found for: HGBA1C  Microbiology: Blood cultures drawn 8/31 no growth 4days  Urine culture taken 8/31 no growth   Imaging: CT head 8/31  No acute intracranial findings. There is moderate generalized atrophy and chronic appearing white matter hypodensities which likely  represent small vessel ischemic disease.  Xray lumbar spine 8/31 No acute fracture or subluxation of the lumbar spine. Severe degenerative disc disease, greatest at L5-S1.  Chest Xray 8/31 Cardiomegaly without pulmonary edema or focal consolidation  Medications: Infusions: . sodium chloride 75 mL/hr at 11/14/15 1118   Scheduled Medications: . sodium chloride   Intravenous Once  . amiodarone  200 mg Oral BID  . ceFEPime (MAXIPIME) IV  2 g Intravenous Q24H  . dexamethasone  20 mg Oral Q Mon  . feeding supplement (GLUCERNA SHAKE)  237 mL Oral TID BM  . linagliptin  5 mg Oral Daily  . metoprolol tartrate  25 mg Oral BID  . pantoprazole  40 mg Oral QODAY  . sodium chloride  500 mL Intravenous Once   And  . sodium chloride  250 mL Intravenous Once  . tamsulosin  0.4 mg Oral QPC supper   PRN Medications: sodium chloride, bismuth subsalicylate, levalbuterol, Melatonin  Assessment/Plan: Pt is a 82 y.o.yo malewith a PMHx of multiple myeloma, CKD stage III, prostate cancer, spinal stenosis, supraventricular tachycardia, COPD and diabetes mellituswho was admitted on 8/31/2017with symptoms of altered mental status, which was determined to be secondary to neutropenic fever.   Active Problems: Septic shock (HCC)  Neutropenic feverHe remains afebrile. Continue renally dosed cefepime for a 7 day total course for coverage of gram negative rods.  -continues cefepime with stop date 9/7 (he has received 6/7 day total course of empiric antibiotic treatment)  - isolation precautions  - follow up CBC   Atrial fibrillation with RVRrates 90. Cardiology is following and we appreciate their recommendations.   - continue Metoprolol '25mg'$  BID for rate control and amiodarone '200mg'$  BID   Hypocalcemia corrected calcium 7.9  -Restarted home Vitamin D, ordered 1g calcium push will treat further based on QTc  Acute kidney injuryCrt back to baseline 1.80. Improving with IV fluids. Will  continue gently IV fluid hydration.  -follow up BMET  -avoid nephrotoxic agents   Multiple myelomaHematology is following, they are considering treatment options at this time.  -continue Decadron 20 mg weekly, holding revlimid for now   Diabetes mellitus: continue CBG monitoring and linagliptin 5 mg daily  Insomnia: Started Ativan 0.5 mg for bedtime  COPD PFT 2010 FEV1/FVC 66%  -Continue levalbuterol nebs PRN for wheeze  DVTprophylaxis SCDs  PT recommends SNF stay and family has been communicating with [facility]   Dispo: Anticipated discharge in approximately 1-2 day(s).   LOS: 6 days   Ledell Noss, MD 11/14/2015, 11:46 AM Pager: [phone]

## 2015-11-15 DIAGNOSIS — D696 Thrombocytopenia, unspecified: Secondary | ICD-10-CM

## 2015-11-15 LAB — BASIC METABOLIC PANEL
ANION GAP: 7 (ref 5–15)
BUN: 29 mg/dL — ABNORMAL HIGH (ref 6–20)
CALCIUM: 7 mg/dL — AB (ref 8.9–10.3)
CHLORIDE: 119 mmol/L — AB (ref 101–111)
CO2: 17 mmol/L — AB (ref 22–32)
Creatinine, Ser: 1.73 mg/dL — ABNORMAL HIGH (ref 0.61–1.24)
GFR calc non Af Amer: 33 mL/min — ABNORMAL LOW (ref >60)
GFR, EST AFRICAN AMERICAN: 39 mL/min — AB (ref >60)
Glucose, Bld: 128 mg/dL — ABNORMAL HIGH (ref 65–99)
POTASSIUM: 4.1 mmol/L (ref 3.5–5.1)
Sodium: 143 mmol/L (ref 135–145)

## 2015-11-15 LAB — CBC WITH DIFFERENTIAL/PLATELET
BASOS PCT: 0 %
Basophils Absolute: 0 10*3/uL (ref 0.0–0.1)
EOS PCT: 4 %
Eosinophils Absolute: 0 10*3/uL (ref 0.0–0.7)
HEMATOCRIT: 25.2 % — AB (ref 39.0–52.0)
HEMOGLOBIN: 8 g/dL — AB (ref 13.0–17.0)
Lymphocytes Relative: 48 %
Lymphs Abs: 0.5 10*3/uL — ABNORMAL LOW (ref 0.7–4.0)
MCH: 31.5 pg (ref 26.0–34.0)
MCHC: 31.7 g/dL (ref 30.0–36.0)
MCV: 99.2 fL (ref 78.0–100.0)
MONOS PCT: 3 %
Monocytes Absolute: 0 10*3/uL — ABNORMAL LOW (ref 0.1–1.0)
NEUTROS PCT: 45 %
Neutro Abs: 0.6 10*3/uL — ABNORMAL LOW (ref 1.7–7.7)
Platelets: 26 10*3/uL — CL (ref 150–400)
RBC: 2.54 MIL/uL — AB (ref 4.22–5.81)
RDW: 21.1 % — AB (ref 11.5–15.5)
WBC: 1.1 10*3/uL — AB (ref 4.0–10.5)

## 2015-11-15 LAB — GLUCOSE, CAPILLARY
Glucose-Capillary: 130 mg/dL — ABNORMAL HIGH (ref 65–99)
Glucose-Capillary: 126 mg/dL — ABNORMAL HIGH (ref 65–99)
Glucose-Capillary: 148 mg/dL — ABNORMAL HIGH (ref 65–99)

## 2015-11-15 MED ORDER — ACETAMINOPHEN 325 MG PO TABS
650.0000 mg | ORAL_TABLET | Freq: Four times a day (QID) | ORAL | Status: DC | PRN
  Administered 2015-11-15 – 2015-11-16 (×3): 650 mg via ORAL
  Filled 2015-11-15 (×3): qty 2

## 2015-11-15 NOTE — Progress Notes (Signed)
IP PROGRESS NOTE  Subjective:  He complains of feeling claustrophobic at night. He reports generalized weakness. Objective: Vital signs in last 24 hours: Blood pressure (!) 158/95, pulse (!) 112, temperature 97.7 F (36.5 C), temperature source Oral, resp. rate 19, height '5\' 7"'$  (1.702 m), weight 152 lb 1.9 oz (69 kg), SpO2 (!) 89 %.  Intake/Output from previous day: 09/06 0701 - 09/07 0700 In: 2191.3 [P.O.:600; I.V.:1431.3; IV Piggyback:160] Out: 1100 [Urine:1100]  Physical Exam:  HEENT: No thrush or bleeding Cardiac: Irregular Lungs: Scattered wheeze, moves air bilaterally Abdomen: Soft and nontender, no splenomegaly Extremities: No leg edema Neurologic: Alert, follows commands   Lab Results:  Recent Labs  11/14/15 0402 11/15/15 0326  WBC 0.7* 1.1*  HGB 8.1* 8.0*  HCT 25.4* 25.2*  PLT 22* 26*   ANC 0.6 BMET  Recent Labs  11/14/15 0402 11/15/15 0326  NA 141 143  K 3.6 4.1  CL 113* 119*  CO2 16* 17*  GLUCOSE 123* 128*  BUN 36* 29*  CREATININE 1.84* 1.73*  CALCIUM 6.4* 7.0*    Studies/Results: No results found.  Medications: I have reviewed the patient's current medications.  Assessment/Plan:  1. Multiple myeloma, IgA kappa, currently on treatment with weekly Decadron. Revlimid on hold secondary to pancytopenia  2. Admission 11/08/2015 sepsis syndrome, improved, cultures remain negative to date  3. Pancytopenia secondary to multiple myeloma and sepsis-stable  4. COPD  5.  Atrial fibrillation  6.  Acute/Chronic renal failure  7.   Diarrhea-likely secondary to ask, nutrition supplements  8.   History of hypercalcemia-improved following Zometa 11/07/2015  He appears stable. The sepsis syndrome has resolved. He has stable pancytopenia. The neutropenia and thrombocytopenia are secondary to multiple myeloma and Revlimid. Hopefully his counts will improve over the next few days. We will consider switching to Velcade when treatment is resumed. The  malaise and limited mobility are related to the myeloma, recent infection, and multiple comorbid conditions. He is participated in physical therapy. He is a candidate for inpatient rehabilitation?  Recommendations: 1. Continue weekly Decadron, hold Revlimid 2. Transfuse platelets for bleeding or a count of less than 10,000 3. Increase ambulation as tolerated, physical therapy     LOS: 7 days   Betsy Coder, MD   11/15/2015, 3:53 PM

## 2015-11-15 NOTE — Clinical Social Work Note (Signed)
CSW informed that patient not medically stable for discharge today. CSW will continue to follow and facilitate discharge to [facility] skilled facility when medically stable.  Baran Kuhrt Givens, MSW, LCSW Licensed Clinical Social Worker [place] [phone]

## 2015-11-15 NOTE — Consult Note (Signed)
   [facility] CM Inpatient Consult   11/15/2015  [name], MD Apr 18, 1924 [id number]    Patient screened for potential [facility] Care Management services. Chart reviewed. Noted discharge plan is for  SNF. Confirmed discharge plan with inpatient Licensed CSW.  There are no identifiable [facility] Care Management needs at this time. If patient's post hospital needs change, please place a [facility] Care Management consult. For questions please contact:  Marthenia Rolling, [place], RN,BSN [facility] Liaison [phone]

## 2015-11-15 NOTE — Progress Notes (Signed)
Internal Medicine Attending:   I saw and examined the patient. I reviewed the resident's note and I agree with the resident's findings and plan as documented in the resident's note.  Blood counts trending up slightly, patient's predominate complaint is poor sleep, we are hesitant to add to many medications for him and he understands this, he thinks he may sleep better tonight in the chair.  He does have some occasional shortness of breath and has been using xopenx nebulizer, today he has no wheezing or rales on exam, we will continue to monitor his respiratory status.

## 2015-11-15 NOTE — Progress Notes (Signed)
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 He denies any sensation of shortness of breath. He denies chest pain, diarrhea or any change in his back pain.   Objective:  Vital signs in last 24 hours: Vitals:   11/14/15 1700 11/14/15 1953 11/15/15 0357 11/15/15 0753  BP: 137/63 134/76 (!) 157/78 (!) 158/95  Pulse: 100 79 65 (!) 112  Resp: '18 19 20 19  '$ Temp: 97.6 F (36.4 C) 98 F (36.7 C) 97.5 F (36.4 C) 97.7 F (36.5 C)  TempSrc: Oral Oral Oral Oral  SpO2: 100% 96% 91% (!) 89%  Weight:      Height:       Physical Exam  Constitutional: He is oriented to person, place, and time. No distress.  Cardiovascular: Normal rate and regular rhythm.   No murmur heard. Pulmonary/Chest: Accessory muscle usage present. He has wheezes. He has no rales.  Expiratory wheeze resolves after xopenex treatment   Abdominal: Soft. He exhibits no distension. There is no tenderness.  Neurological: He is alert and oriented to person, place, and time.  Skin: Skin is warm and dry.  Extremiteis: no calf tenderness, no peripheral edema   Labs: CBC:  Recent Labs Lab 11/11/15 0449 11/12/15 0159 11/13/15 0513 11/14/15 0402 11/15/15 0326  WBC 1.6* 1.3* 0.7* 0.7* 1.1*  NEUTROABS 0.9* 0.8* 0.4* 0.4* 0.6*  HGB 8.8* 8.6* 8.4* 8.1* 8.0*  HCT 27.1* 26.3* 25.2* 25.4* 25.2*  MCV 96.1 97.0 96.2 98.4 99.2  PLT 37* 31* 25* 22* 26*   Metabolic Panel:  Recent Labs Lab 11/09/15 0208  11/10/15 0212 11/11/15 0449 11/12/15 0159 11/13/15 0513 11/14/15 0402 11/14/15 0554 11/15/15 0326  NA 137  < > 137 139 141 141 141  --  143  K 3.7  < > 4.0 3.8 3.6 4.9 3.6  --  4.1  CL 107  < > 113* 115* 117* 118* 113*  --  119*  CO2 19*  < > 16* 16* 18* 17* 16*  --  17*  GLUCOSE 108*  < > 168* 159* 123* 165* 123*  --  128*  BUN 38*  < > 38* 47* 44* 42* 36*  --  29*  CREATININE 2.43*  < > 2.21* 2.38* 2.33* 2.12* 1.84*  --  1.73*  CALCIUM 8.0*  < >  7.5* 7.1* 6.9* 6.5* 6.4*  --  7.0*  MG 1.4*  --  2.1  --   --   --   --  2.0  --   PHOS 3.1  --  4.5  --   --   --   --   --   --   ALBUMIN 2.1*  --   --   --   --   --   --   --   --   < > = values in this interval not displayed. Cardiac Labs:  Recent Labs Lab 11/08/15 1259 11/08/15 2039 11/09/15 0109  TROPONINI 0.47* 0.68* 0.68*   BG:  Recent Labs Lab 11/13/15 0418 11/13/15 0811 11/13/15 1233 11/13/15 1659 11/13/15 2024  GLUCAP 162* 130* 158* 124* 132*   No results found for: HGBA1C  Microbiology: Blood cultures drawn 8/31 no growth 5days  Urine culture taken 8/31 no growth   Imaging: CT head 8/31  No acute intracranial findings. There is moderate generalized atrophy and chronic appearing white matter hypodensities which likely represent small vessel ischemic disease.  Xray lumbar spine  8/31 No acute fracture or subluxation of the lumbar spine. Severe degenerative disc disease, greatest at L5-S1.  Chest Xray 8/31 Cardiomegaly without pulmonary edema or focal consolidation   Medications: Infusions: . sodium chloride 75 mL/hr at 11/14/15 1118   Scheduled Medications: . sodium chloride   Intravenous Once  . amiodarone  200 mg Oral BID  . cholecalciferol  1,000 Units Oral Daily  . dexamethasone  20 mg Oral Q Mon  . feeding supplement (GLUCERNA SHAKE)  237 mL Oral TID BM  . linagliptin  5 mg Oral Daily  . LORazepam  0.5 mg Oral QHS  . metoprolol tartrate  25 mg Oral BID  . pantoprazole  40 mg Oral QODAY  . sodium chloride  500 mL Intravenous Once   And  . sodium chloride  250 mL Intravenous Once  . tamsulosin  0.4 mg Oral QPC supper   PRN Medications: sodium chloride, bismuth subsalicylate, levalbuterol, Melatonin  Assessment/Plan: Pt is a 82 y.o.yo malewith a PMHx of multiple myeloma, CKD stage III, prostate cancer, spinal stenosis, supraventricular tachycardia, COPD and diabetes mellituswho was admitted on 8/31/2017with symptoms of altered  mental status, which was determined to be secondary to neutropenic fever.   Active Problems:   Septic shock (HCC)   Febrile neutropenia (HCC)   PAF (paroxysmal atrial fibrillation) (HCC)  Neutropenic feverHe remains afebrile. Completed empiric antibiotic 7 day course for coverage of gram negative rods.  - isolation precautions  - follow up CBC   Atrial fibrillation with RVRrates 90-100s. Cardiology is following and we appreciate their recommendations.  - continue Metoprolol '25mg'$  BID for rate control and amiodarone '200mg'$  BID    Hypocalcemia Improving, corrected Ca 8.5 today. QTc difficult to obtain as he is in afib. Continue home Vitamin D.   Acute kidney injuryCrt back to baseline 1.80. Improving with IV fluids. Will continue gently IV fluid hydration.  -follow up BMET  -avoid nephrotoxic agents   Multiple myelomaHematology is following, they are considering treatment options at this time.  -continue Decadron 20 mg every monday, holding revlimid for now   Diabetes mellitus continue CBG monitoring and linagliptin 5 mg daily  Insomnia continue melatonin and Ativan 0.5 mg for bedtime  COPD PFT 2010 FEV1/FVC 66%    -Continue levalbuterol nebs PRN for wheeze  DVTprophylaxis SCDs  PT recommends SNF stay and family has been communicating with [facility]   Dispo: Anticipated discharge in approximately 2-4 day(s).   LOS: 7 days   Ledell Noss, MD 11/15/2015, 8:41 AM Pager: [phone]

## 2015-11-15 NOTE — Progress Notes (Signed)
       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/2015    SUBJECTIVE: Still issues with sleeping in chair now   TELEMETRY:  A fibrillation with better rate control  80-90's  Vitals:   11/14/15 1700 11/14/15 1953 11/15/15 0357 11/15/15 0753  BP: 137/63 134/76 (!) 157/78 (!) 158/95  Pulse: 100 79 65 (!) 112  Resp: 18 19 20 19   Temp: 97.6 F (36.4 C) 98 F (36.7 C) 97.5 F (36.4 C) 97.7 F (36.5 C)  TempSrc: Oral Oral Oral Oral  SpO2: 100% 96% 91% (!) 89%  Weight:      Height:        Intake/Output Summary (Last 24 hours) at 11/15/15 0913 Last data filed at 11/15/15 [id number]  Gross per 24 hour  Intake          2191.25 ml  Output             1100 ml  Net          1091.25 ml   LABS: Basic Metabolic Panel:  Recent Labs  11/14/15 0402 11/14/15 0554 11/15/15 0326  NA 141  --  143  K 3.6  --  4.1  CL 113*  --  119*  CO2 16*  --  17*  GLUCOSE 123*  --  128*  BUN 36*  --  29*  CREATININE 1.84*  --  1.73*  CALCIUM 6.4*  --  7.0*  MG  --  2.0  --    CBC:  Recent Labs  11/14/15 0402 11/15/15 0326  WBC 0.7* 1.1*  NEUTROABS 0.4* 0.6*  HGB 8.1* 8.0*  HCT 25.4* 25.2*  MCV 98.4 99.2  PLT 22* 26*    Radiology/Studies:  No new data  Physical Exam: Blood pressure (!) 158/95, pulse (!) 112, temperature 97.7 F (36.5 C), temperature source Oral, resp. rate 19, height 5\' 7"  (1.702 m), weight 69 kg (152 lb 1.9 oz), SpO2 (!) 89 %. Weight change:   Wt Readings from Last 3 Encounters:  11/13/15 69 kg (152 lb 1.9 oz)  11/07/15 54.4 kg (120 lb)  10/22/15 54.4 kg (120 lb)   Frail and ill appearing. No respiratory distress. Lungs clear except basilar rales. No murmur or rub on cardiac auscultation. No edema  ASSESSMENT:  1. Atrial fibrillation with RVR improved with beta blocker and amiodarone. 2. CAD, stable. 3. MM with pancytopenia.  Plan:  1. Continue current therapy. 2. At discharge DC the amiodarone. 3. If rate control issues as OP, will add  Digoxin. 4. No anticoagulation given cytopenia  Rozann Lesches 11/15/2015, 9:13 AM

## 2015-11-16 ENCOUNTER — Other Ambulatory Visit: Payer: Self-pay | Admitting: *Deleted

## 2015-11-16 DIAGNOSIS — C9000 Multiple myeloma not having achieved remission: Secondary | ICD-10-CM

## 2015-11-16 DIAGNOSIS — E43 Unspecified severe protein-calorie malnutrition: Secondary | ICD-10-CM | POA: Insufficient documentation

## 2015-11-16 LAB — CBC WITH DIFFERENTIAL/PLATELET
BASOS PCT: 0 %
Basophils Absolute: 0 10*3/uL (ref 0.0–0.1)
EOS PCT: 2 %
Eosinophils Absolute: 0 10*3/uL (ref 0.0–0.7)
HEMATOCRIT: 25.6 % — AB (ref 39.0–52.0)
Hemoglobin: 8.1 g/dL — ABNORMAL LOW (ref 13.0–17.0)
LYMPHS ABS: 0.5 10*3/uL — AB (ref 0.7–4.0)
Lymphocytes Relative: 47 %
MCH: 31.3 pg (ref 26.0–34.0)
MCHC: 31.6 g/dL (ref 30.0–36.0)
MCV: 98.8 fL (ref 78.0–100.0)
MONOS PCT: 4 %
Monocytes Absolute: 0 10*3/uL — ABNORMAL LOW (ref 0.1–1.0)
Neutro Abs: 0.5 10*3/uL — ABNORMAL LOW (ref 1.7–7.7)
Neutrophils Relative %: 47 %
Platelets: 27 10*3/uL — CL (ref 150–400)
RBC: 2.59 MIL/uL — AB (ref 4.22–5.81)
RDW: 21 % — AB (ref 11.5–15.5)
WBC: 1 10*3/uL — AB (ref 4.0–10.5)

## 2015-11-16 LAB — GLUCOSE, CAPILLARY
GLUCOSE-CAPILLARY: 103 mg/dL — AB (ref 65–99)
Glucose-Capillary: 96 mg/dL (ref 65–99)
Glucose-Capillary: 116 mg/dL — ABNORMAL HIGH (ref 65–99)

## 2015-11-16 NOTE — Care Management Important Message (Signed)
Important Message  Patient Details  Name: Paul TINDER, Paul Burnett MRN: [id number] Date of Birth: January 25, 1925   Medicare Important Message Given:  Yes    Syniah Berne Montine Circle 11/16/2015, 10:44 AM

## 2015-11-16 NOTE — Discharge Summary (Signed)
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 PCP: Lajean Manes, MD  Date of Admission: 11/08/2015  2:19 AM Date of Discharge: 11/17/2015 Attending Physician: Lucious Groves, DO  Discharge Diagnosis: 1. Neutropenic fever   Admitted with AMS and hypotension which improved with stabilization in ICU   Blood cx and urine cx negative   Received 7 day course of empiric abx   Active Problems:   Septic shock (HCC)   Febrile neutropenia (HCC)   PAF (paroxysmal atrial fibrillation) (HCC)   Protein-calorie malnutrition, severe   Discharge Medications:   Medication List    STOP taking these medications   amoxicillin 500 MG capsule Commonly known as:  AMOXIL   lenalidomide 10 MG capsule Commonly known as:  REVLIMID     TAKE these medications   aspirin 81 MG tablet Take 81 mg by mouth every other day.   cholecalciferol 1000 units tablet Commonly known as:  VITAMIN D Take 1,000 Units by mouth daily.   dexamethasone 4 MG tablet Commonly known as:  DECADRON Take 5 tablets (20 mg total) by mouth every Monday. Start taking on:  11/19/2015 What changed:  when to take this   linagliptin 5 MG Tabs tablet Commonly known as:  TRADJENTA Take 5 mg by mouth daily.   LORazepam 0.5 MG tablet Commonly known as:  ATIVAN Take 1 tablet (0.5 mg total) by mouth at bedtime.   metoprolol tartrate 25 MG tablet Commonly known as:  LOPRESSOR Take 25-37.5 mg by mouth 2 (two) times daily. Take 37.'5mg'$  in the am and '25mg'$  in the evening   omeprazole 20 MG capsule Commonly known as:  PRILOSEC Take 20 mg by mouth every other day.   pyridOXINE 50 MG tablet Commonly known as:  B-6 Take 50 mg by mouth daily.   tamsulosin 0.4 MG Caps capsule Commonly known as:  FLOMAX Take 0.4 mg by mouth daily. 30 min after the same meal, daily   VITAMIN B-12 IJ Inject 1 mL as directed every 30 (thirty) days.   vitamin C 500 MG tablet Commonly known as:  ASCORBIC ACID Take 500 mg by  mouth daily.       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  At the hospital follow up visit please address:  1.  Neutropenic fever- Any new signs of fever or altered mental status?  Multiple myeloms- Has he had blood count improvements, if so, can he be started on new therapy for multiple myeloma.  2.  Labs / imaging needed at time of follow-up: CBC, BMET   3.  Pending labs/ test needing follow-up: none   Follow-up Appointments: Follow-up Information    Annia Belt, MD Follow up on 11/20/2015.   Specialty:  Oncology Why:  Please present for your weekly labs appointment with Dr. 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 Presented on 8/31 with complaints of progressive generalized weakness, fever, vomiting, worsening of chronic back pain, and incoherence.He was diagnosed with multiple  myeloma 3 weeks prior to admission, his treatment medications include dexamethasone '20mg'$  weekly and lenalidomide (revlimid) 5 doses so far.  In the ED he was found to have fever of 104, hypotensive with blood pressure 90/40, tachycardic with HR 125, and hypoxemic with SpO2 75 on room air. CBC showed pancytopenia with WBC 1.1, Neutrophils 0.9, Hgb 6.4, Plt 51. Blood cultures and urine cultures were drawn and he was started on Levaquin, vancomycin, and pipercillin-tazobactam and admitted to the ICU. His symptoms improved significantly and blood cultures and urine cultures had  no growth. He was transferred to the floors and vanc zosyn were narrowed to cefepime to cover gram negative rods. He received 7 day total course of empiric antibiotics.   Multiple Myeloma  He was diagnosed with multiple myeloma 3 weeks prior to admission, his treatment medications include dexamethasone '20mg'$  every monday and lenalidomide (revlimid) 5 doses so far. Dexamethasone was continued but revlimid was held. Pancytopenia persisted for the duration of his hospitalization. He was followed by oncology during the hospitalization and they have considered switching to velcade based therapy as counts improve with outpatient follow up.   Paroxysmal Atrial fibrillation  He takes Metoprolol 37.4 mg in the morning and 25 mg in the evening. He was initially started on Metoprolol 12.5 mg this dose was gradually increased as hypotensive blood pressure allowed in the setting of sepsis. He went into A. Fib on 9/4 and was started on amiodarone, he returned to normal sinus rhythm on 9/8.   Acute Kidney injury Creatinine 2.7 on presentation with baseline 1.8. Improved with IV fluids to 1.7 on the day of discharge   COPD PFT 2010 FEV1/FVC 66%. Levalbuterol nebs prn controlled wheeze.   Insomnia- Achieved the greatest control with combination of tylenol and ativan 0.5 mg.   Hypocalcemia Home Vitamin D was continued.   DM - He was monitored with CBG and maintained good glycemic control with home medication Linagliption 5 mg daily.   Discharge Vitals:   BP (!) 150/59 (BP Location: Right Arm)   Pulse 95   Temp (!) 96.1 F (35.6 C) (Oral)   Resp 18   Ht '5\' 7"'$  (1.702 m) Comment: recorded on 11/07/15 @ 09:00  Wt 121 lb 9.6 oz (55.2 kg)   SpO2 97%   BMI 19.05 kg/m   Pertinent Labs, Studies, and Procedures: CBC 8/31 WBC 1.1, neutrophils 0.9, Hgb 6.4, plt 51 CBC 9/8 WBC 1.0, neutrophil 0.5, Hgb 8.1, plt 27   Procedures Performed:  Dg Chest 1 View  Result Date: 11/08/2015 CLINICAL DATA:  Generalized  weakness.  Fever and nausea.  Sepsis. EXAM: CHEST 1 VIEW COMPARISON:  Chest radiograph 10/11/2015 FINDINGS: The cardiac silhouette is enlarged, likely exaggerated by AP technique. No focal airspace consolidation. No overt pulmonary edema. No pneumothorax or sizable pleural effusion. IMPRESSION: Cardiomegaly without pulmonary edema or focal consolidation. Electronically Signed   By: Ulyses Jarred M.D.   On: 11/08/2015 04:00   Dg Lumbar Spine Complete  Result Date: 11/08/2015 CLINICAL DATA:  Sepsis.  Generalized weakness. EXAM: LUMBAR SPINE - COMPLETE 4+ VIEW COMPARISON:  Lumbar spine radiograph 10/11/2015 FINDINGS: No evidence of fracture or acute subluxation. There is extensive lumbar degenerative disease including multilevel disc space narrowing and mild vertebral body height loss. Disc space narrowing is most severe at L5-S1. Multiple large anterior and lateral osteophytes are noted. Grade 1 retrolisthesis at L3-L4. Multilevel facet arthrosis. There is an incompletely visualized right total hip arthroplasty. Cholecystectomy clips are noted. Multiple pelvic surgical clips are  seen. IMPRESSION: No acute fracture or subluxation of the lumbar spine. Severe degenerative disc disease, greatest at L5-S1. Electronically Signed   By: Ulyses Jarred M.D.   On: 11/08/2015 04:04   Ct Head Wo Contrast  Result Date: 11/08/2015 CLINICAL DATA:  Increasing weakness EXAM: CT HEAD WITHOUT CONTRAST TECHNIQUE: Contiguous axial images were obtained from the base of the skull through the vertex without intravenous contrast. COMPARISON:  None. FINDINGS: Brain: There is no intracranial hemorrhage, mass or evidence of acute infarction. There is mild generalized atrophy. There is mild chronic microvascular ischemic change. There is no significant extra-axial fluid collection. No acute intracranial findings are evident. Vascular: No hyperdense vessel or unexpected calcification. Skull/Sinuses/Orbits: The calvarium and skullbase are  intact. Moderate membrane thickening of the left sphenoid sinus, indeterminate chronicity. IMPRESSION: No acute intracranial findings. There is moderate generalized atrophy and chronic appearing white matter hypodensities which likely represent small vessel ischemic disease. Electronically Signed   By: Andreas Newport M.D.   On: 11/08/2015 04:20   Consultations: Treatment Team:  Annia Belt, MD Ladell Pier, MD Belva Crome, MD  Discharge Instructions: Discharge Instructions    Call MD for:  extreme fatigue    Complete by:  As directed   Call MD for:  persistant dizziness or light-headedness    Complete by:  As directed   Call MD for:  temperature >100.4    Complete by:  As directed   Diet - low sodium heart healthy    Complete by:  As directed   Increase activity slowly    Complete by:  As directed      Signed: Ledell Noss, MD 11/17/2015, 10:39 AM   Pager: [phone]

## 2015-11-16 NOTE — Progress Notes (Signed)
Subjective: This morning, he reported not sleeping well last night was looking for to going to skilled nursing facility tomorrow. He expressed his deepest appreciation for our current president and inquired as to whether I shared it though I politely declined to answer the question.   Objective:  Vital signs in last 24 hours: Vitals:   11/15/15 2045 11/16/15 0420 11/16/15 0828 11/16/15 1729  BP: (!) 155/87 135/73 (!) 154/87 (!) 175/57  Pulse: 94 69 67 79  Resp: '18 17 18 17  '$ Temp: 97.7 F (36.5 C) 97.7 F (36.5 C) 98 F (36.7 C) 98.2 F (36.8 C)  TempSrc: Oral Oral Oral Oral  SpO2: 94% 98% 99% 97%  Weight:      Height:       Physical Exam  Constitutional: He is oriented to person, place, and time. No distress.  HENT:  Head: Normocephalic and atraumatic.  Eyes: No scleral icterus.  Cardiovascular: Normal rate and regular rhythm.   Pulmonary/Chest: Effort normal and breath sounds normal.  Abdominal: Soft. He exhibits no distension. There is no tenderness.  Neurological: He is alert and oriented to person, place, and time.  Skin: Skin is warm and dry.    Labs: CBC:  Recent Labs Lab 11/12/15 0159 11/13/15 0513 11/14/15 0402 11/15/15 0326 11/16/15 0423  WBC 1.3* 0.7* 0.7* 1.1* 1.0*  NEUTROABS 0.8* 0.4* 0.4* 0.6* 0.5*  HGB 8.6* 8.4* 8.1* 8.0* 8.1*  HCT 26.3* 25.2* 25.4* 25.2* 25.6*  MCV 97.0 96.2 98.4 99.2 98.8  PLT 31* 25* 22* 26* 27*   Metabolic Panel:  Recent Labs Lab 11/10/15 0212 11/11/15 0449 11/12/15 0159 11/13/15 0513 11/14/15 0402 11/14/15 0554 11/15/15 0326  NA 137 139 141 141 141  --  143  K 4.0 3.8 3.6 4.9 3.6  --  4.1  CL 113* 115* 117* 118* 113*  --  119*  CO2 16* 16* 18* 17* 16*  --  17*  GLUCOSE 168* 159* 123* 165* 123*  --  128*  BUN 38* 47* 44* 42* 36*  --  29*  CREATININE 2.21* 2.38* 2.33* 2.12* 1.84*  --  1.73*  CALCIUM 7.5* 7.1* 6.9* 6.5* 6.4*  --  7.0*  MG 2.1  --   --   --   --  2.0  --   PHOS 4.5  --   --   --   --   --   --      BG:  Recent Labs Lab 11/15/15 1153 11/15/15 1703 11/16/15 0825 11/16/15 1102 11/16/15 1726  GLUCAP 126* 148* 96 116* 103*   No results found for: HGBA1C  Microbiology: Blood cultures drawn 8/31 no growth 5days  Urine culture taken 8/31 no growth   Imaging: CT head 8/31  No acute intracranial findings. There is moderate generalized atrophy and chronic appearing white matter hypodensities which likely represent small vessel ischemic disease.  Xray lumbar spine 8/31 No acute fracture or subluxation of the lumbar spine. Severe degenerative disc disease, greatest at L5-S1.  Chest Xray 8/31 Cardiomegaly without pulmonary edema or focal consolidation   Medications: Infusions:   Scheduled Medications: . sodium chloride   Intravenous Once  . cholecalciferol  1,000 Units Oral Daily  . dexamethasone  20 mg Oral Q Mon  . feeding supplement (GLUCERNA SHAKE)  237 mL Oral TID BM  . linagliptin  5 mg Oral Daily  . LORazepam  0.5 mg Oral QHS  . metoprolol tartrate  25 mg Oral BID  . pantoprazole  40 mg  Oral QODAY  . sodium chloride  500 mL Intravenous Once   And  . sodium chloride  250 mL Intravenous Once  . 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   Active Problems:   Septic shock (HCC)   Febrile neutropenia (HCC)   PAF (paroxysmal atrial fibrillation) (HCC)   Protein-calorie malnutrition, severe  Neutropenic fever: Afebrile again having completed 7-day course of antibiotics for empiric coverage of Gram negative rods.  - isolation precautions  - follow up CBC   Atrial fibrillation:HR 69-112.  -Continue Metoprolol '25mg'$  BID for  rate control -Discontinue amiodarone '200mg'$  BID -Cardiology following, appreciate recommendations  Hypocalcemia: Continue home Vitamin D.   Acute on chronic kidney disease Stage III:Crt at baseline 1.7-1.8, resolved.  Multiple myeloma: Per Oncology, may switch to Velcade-based therapy if counts improve though will need close outpatient follow-up. -Continue Decadron 20 mg every monday, holding revlimid for now   Diabetes mellitus Continue CBG monitoring and linagliptin 5 mg daily  Insomnia Continue melatonin and Ativan 0.5 mg for bedtime  COPD PFT 2010 FEV1/FVC 66%    -Continue levalbuterol nebs PRN for wheeze  DVTprophylaxis SCDs  PT recommends SNF stay and family has been communicating with [facility]   Dispo: Anticipated discharge in approximately 2-4 day(s).   LOS: 8 days   Riccardo Dubin, MD 11/16/2015, 6:07 PM Pager: [phone]

## 2015-11-16 NOTE — Progress Notes (Signed)
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 yo male with a PMHx of multiple myeloma, CKD stage III, prostate cancer, spinal stenosis, supraventricular tachycardia, COPD and diabetes mellitus who was admitted on 11/08/2015 with symptoms of weakness and altered mental status, which was determined to be secondary to neutropenic fever. Hospital course also complicated by afib with RVR    PT Comments    Patient making progress with mobility and gait.  Follow Up Recommendations  SNF     Equipment Recommendations  Rolling walker with 5" wheels;3in1 (PT)    Recommendations for Other Services       Precautions / Restrictions Precautions Precautions: Fall Precaution Comments: Neutropenic Precautions Restrictions Weight Bearing Restrictions: No    Mobility  Bed Mobility               General bed mobility comments: Patient in recliner  Transfers Overall transfer level: Needs assistance Equipment used: Rolling walker (2 wheeled) Transfers: Sit to/from Stand Sit to Stand: Mod assist;+2 physical assistance         General transfer comment: Verbal cues for hand placement.  Assist to power up to standing and for balance.  On standing, noted patient's chair covering and cushion were wet - notified RN poss cath leaking.  Changed chair covering.  Ambulation/Gait Ambulation/Gait assistance: Min assist;+2 safety/equipment (chair) Ambulation Distance (Feet): 54 Feet Assistive device: Rolling walker (2 wheeled) Gait Pattern/deviations: Step-through pattern;Decreased step length - right;Decreased step length - left;Decreased stride length;Shuffle;Trunk flexed Gait velocity: decreased Gait velocity interpretation: Below normal speed for age/gender General Gait Details: Patient with very flexed posture.  Cues to stay close to RW.  Followed with chair for  safety.   Stairs            Wheelchair Mobility    Modified Rankin (Stroke Patients Only)       Balance             Standing balance-Leahy Scale: Poor                      Cognition Arousal/Alertness: Awake/alert Behavior During Therapy: WFL for tasks assessed/performed Overall Cognitive Status: Within Functional Limits for tasks assessed                      Exercises      General Comments        Pertinent Vitals/Pain Pain Assessment: Faces Faces Pain Scale: Hurts even more Pain Location: Back pain Pain Descriptors / Indicators: Aching;Sore Pain Intervention(s): Monitored during session;Repositioned    Home Living                      Prior Function            PT Goals (current goals can now be found in the care plan section) Acute Rehab PT Goals Patient Stated Goal: wants to get stronger; plans on Rehab at [facility] Progress towards PT goals: Progressing toward goals    Frequency  Min 3X/week    PT Plan Current plan remains appropriate    Co-evaluation             End of Session Equipment Utilized During Treatment: Gait belt Activity Tolerance: Patient tolerated treatment well;Patient limited by fatigue Patient left: in chair;with call bell/phone within reach     Time: 1423-1433 PT Time Calculation (min) (  ACUTE ONLY): 10 min  Charges:  $Gait Training: 8-22 mins                    G Codes:      Davis, Susan H 11/16/2015, 5:01 PM Susan H. Davis, PT, MBA Acute Rehab Services Pager [phone]   

## 2015-11-16 NOTE — Progress Notes (Signed)
Internal Medicine Attending:   I saw and examined the patient. I reviewed the resident's note and I agree with the resident's findings and plan as documented in the resident's note.  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  Otherwise he is stable for discharge.  I understand he has a bed at SNF available tomorrow.

## 2015-11-16 NOTE — Clinical Social Work Note (Addendum)
Patient not yet medically stable for discharge per MD note. Call made to [facility]) with [facility] and update provided. Rosendo Gros advised that patient may be ready to discharge over weekend and asked that the rehab nurse be contacted [phone]) if patient is ready for discharge over the weekend. CSW also talked by phone with patient's son, Dr. Elsie Saas [phone]) regarding patient and his discharge back to [facility] rehab.   Information provided to weekend CSW regarding patient.   Kevona Lupinacci Givens, MSW, LCSW Licensed Clinical Social Worker [place] [phone]

## 2015-11-16 NOTE — Progress Notes (Signed)
       Patient Name: [name], MD Date of Encounter: 11/16/2015    SUBJECTIVE: Sleeping in chair still Mild dyspnea relieved with inhaler   TELEMETRY:  A fibrillation with better rate control  80-90's  Vitals:   11/15/15 1709 11/15/15 2045 11/16/15 0420 11/16/15 0828  BP: 132/78 (!) 155/87 135/73 (!) 154/87  Pulse: 97 94 69 67  Resp: 18 18 17 18   Temp: 97.3 F (36.3 C) 97.7 F (36.5 C) 97.7 F (36.5 C) 98 F (36.7 C)  TempSrc: Oral Oral Oral Oral  SpO2: 94% 94% 98% 99%  Weight:      Height:        Intake/Output Summary (Last 24 hours) at 11/16/15 0829 Last data filed at 11/16/15 0600  Gross per 24 hour  Intake             1100 ml  Output              750 ml  Net              350 ml   LABS: Basic Metabolic Panel:  Recent Labs  11/14/15 0402 11/14/15 0554 11/15/15 0326  NA 141  --  143  K 3.6  --  4.1  CL 113*  --  119*  CO2 16*  --  17*  GLUCOSE 123*  --  128*  BUN 36*  --  29*  CREATININE 1.84*  --  1.73*  CALCIUM 6.4*  --  7.0*  MG  --  2.0  --    CBC:  Recent Labs  11/15/15 0326 11/16/15 0423  WBC 1.1* 1.0*  NEUTROABS 0.6* 0.5*  HGB 8.0* 8.1*  HCT 25.2* 25.6*  MCV 99.2 98.8  PLT 26* 27*    Radiology/Studies:  No new data  Physical Exam: Blood pressure (!) 154/87, pulse 67, temperature 98 F (36.7 C), temperature source Oral, resp. rate 18, height 5\' 7"  (1.702 m), weight 55.2 kg (121 lb 9.6 oz), SpO2 99 %. Weight change:   Wt Readings from Last 3 Encounters:  11/15/15 55.2 kg (121 lb 9.6 oz)  11/07/15 54.4 kg (120 lb)  10/22/15 54.4 kg (120 lb)   Frail and ill appearing. No respiratory distress. Lungs clear except basilar rales. No murmur or rub on cardiac auscultation. No edema  ASSESSMENT:  1. Atrial fibrillation with RVR improved with beta blocker and amiodarone. 2. CAD, stable. 3. MM with pancytopenia.  Plan:  1. Continue current therapy. 2. D/C amiodarone as Dr Tamala Julian did not want him d/c on it 3. If rate control  issues as OP, will add Digoxin. 4. No anticoagulation given cytopenia  Will arrange outpatient f/u Dr Tamala Julian   Signed, Jenkins Rouge 11/16/2015, 8:29 AM

## 2015-11-16 NOTE — Progress Notes (Signed)
Initial Nutrition Assessment  DOCUMENTATION CODES:   Severe malnutrition in context of chronic illness  INTERVENTION:  Continue Glucerna Shake po TID, each supplement provides 220 kcal and 10 grams of protein.  Encourage adequate PO intake.   NUTRITION DIAGNOSIS:   Malnutrition related to chronic illness as evidenced by percent weight loss, severe depletion of muscle mass.  GOAL:   Patient will meet greater than or equal to 90% of their needs  MONITOR:   PO intake, Supplement acceptance, Labs, Weight trends, Skin, I & O's  REASON FOR ASSESSMENT:    (Poor PO via RN)    ASSESSMENT:   82 y.o. yo male with a PMHx of multiple myeloma, CKD stage III, prostate cancer, spinal stenosis, supraventricular tachycardia, COPD and diabetes mellitus who was admitted on 11/08/2015 with symptoms of altered mental status, which was determined to be secondary to neutropenic fever.   Meal completion has been varied from 0-50%. Pt reports having a lack of appetite since admission. Pt reports eating well PTA with usual consumption of at least 3 meals a day. Per Epic weight records, pt with a 13.5% weight loss in 5 months. Pt unaware of weight lost. Pt currently has Glucerna Shake ordered and has been consuming them. RD to continue with current orders. Pt reports plans for discharge tomorrow to SNF.   Nutrition-Focused physical exam completed. Findings are moderate fat depletion, moderate to severe muscle depletion, and no edema.   Labs and medications reviewed.   Diet Order:  Diet regular Room service appropriate? Yes; Fluid consistency: Thin  Skin:  Reviewed, no issues  Last BM:  9/8  Height:   Ht Readings from Last 1 Encounters:  11/12/15 '5\' 7"'$  (1.702 m)    Weight:   Wt Readings from Last 1 Encounters:  11/15/15 121 lb 9.6 oz (55.2 kg)    Ideal Body Weight:  67.27 kg  BMI:  Body mass index is 19.05 kg/m.  Estimated Nutritional Needs:   Kcal:  1600-1800  Protein:  70-80  grams  Fluid:  1.6 - 1.8 L/day  EDUCATION NEEDS:   No education needs identified at this time  Corrin Parker, MS, RD, LDN Pager # [phone] After hours/ weekend pager # [phone]

## 2015-11-16 NOTE — Progress Notes (Signed)
IP PROGRESS NOTE  Subjective:  No new complaint. He was able to ambulate with physical therapy. No bleeding. Objective: Vital signs in last 24 hours: Blood pressure (!) 154/87, pulse 67, temperature 98 F (36.7 C), temperature source Oral, resp. rate 18, height _0  (1.702 m), weight 121 lb 9.6 oz (55.2 kg), SpO2 99 %.  Intake/Output from previous day: 09/07 0701 - 09/08 0700 In: 1100 [P.O.:1100] Out: 750 [Urine:750]  Physical Exam:  HEENT: No thrush or bleeding Cardiac: Irregular  Extremities: No leg edema Neurologic: Alert, follows commands   Lab Results:  Recent Labs  11/15/15 0326 11/16/15 0423  WBC 1.1* 1.0*  HGB 8.0* 8.1*  HCT 25.2* 25.6*  PLT 26* 27*   ANC 0.5 BMET  Recent Labs  11/14/15 0402 11/15/15 0326  NA 141 143  K 3.6 4.1  CL 113* 119*  CO2 16* 17*  GLUCOSE 123* 128*  BUN 36* 29*  CREATININE 1.84* 1.73*  CALCIUM 6.4* 7.0*    Studies/Results: No results found.  Medications: I have reviewed the patient's current medications.  Assessment/Plan:  1. Multiple myeloma, IgA kappa, currently on treatment with weekly Decadron. Revlimid on hold secondary to pancytopenia  2. Admission 11/08/2015 sepsis syndrome, improved, cultures remain negative to date  3. Pancytopenia secondary to multiple myeloma and sepsis-stable  4. COPD  5.  Atrial fibrillation  6.  Acute/Chronic renal failure  7.   Diarrhea-likely secondary to ask, nutrition supplements  8.   History of hypercalcemia-improved following Zometa 11/07/2015  He appears unchanged. He has stable pancytopenia. The pancytopenia is secondary to multiple myeloma. The persistent severe neutropenia and thrombocytopenia are likely in part secondary to Revlimid that he received for 5 days beginning on 11/01/2015. The sepsis syndrome on hospital admission is also likely contributing.  I am reluctant to resume Revlimid therapy at present. We will consider switching to [facility] therapy if the  neutropenia and thrombocytopenia improve over the next week. I discussed the situation with his son this morning. He is stable for discharge from an oncology standpoint. We will arrange for close outpatient follow-up of the CBC. I will arrange for an office visit next week. He should call for a fever or bleeding.  Recommendations: 1. Continue weekly Decadron, hold Revlimid 2. Transfuse platelets for bleeding or a count of less than 10,000 3. Increase ambulation as tolerated, physical therapy 4. Okay for discharge from an oncology standpoint. An outpatient lab and office visit will be arranged for the week of 11/19/2015.  Please call oncology as needed. I will see him 11/19/2015 if he remains in the hospital.     LOS: 8 days   Betsy Coder, MD   11/16/2015, 2:07 PM

## 2015-11-17 LAB — GLUCOSE, CAPILLARY: Glucose-Capillary: 92 mg/dL (ref 65–99)

## 2015-11-17 MED ORDER — DEXAMETHASONE 4 MG PO TABS: 20.0000 mg | ORAL_TABLET | ORAL | 0 refills | Status: AC

## 2015-11-17 MED ORDER — LORAZEPAM 0.5 MG PO TABS: 0.5000 mg | ORAL_TABLET | Freq: Every day | ORAL | 0 refills | Status: DC

## 2015-11-17 NOTE — Progress Notes (Signed)
[name], MD to be D/C'd [place] per MD order.  Discussed prescriptions and follow up appointments with the patient. Prescriptions given to patient, medication list explained in detail. Pt verbalized understanding.    Medication List    STOP taking these medications   amoxicillin 500 MG capsule Commonly known as:  AMOXIL   lenalidomide 10 MG capsule Commonly known as:  REVLIMID     TAKE these medications   aspirin 81 MG tablet Take 81 mg by mouth every other day.   cholecalciferol 1000 units tablet Commonly known as:  VITAMIN D Take 1,000 Units by mouth daily.   dexamethasone 4 MG tablet Commonly known as:  DECADRON Take 5 tablets (20 mg total) by mouth every Monday. Start taking on:  11/19/2015 What changed:  when to take this   linagliptin 5 MG Tabs tablet Commonly known as:  TRADJENTA Take 5 mg by mouth daily.   LORazepam 0.5 MG tablet Commonly known as:  ATIVAN Take 1 tablet (0.5 mg total) by mouth at bedtime.   metoprolol tartrate 25 MG tablet Commonly known as:  LOPRESSOR Take 25-37.5 mg by mouth 2 (two) times daily. Take 37.5mg  in the am and 25mg  in the evening   omeprazole 20 MG capsule Commonly known as:  PRILOSEC Take 20 mg by mouth every other day.   pyridOXINE 50 MG tablet Commonly known as:  B-6 Take 50 mg by mouth daily.   tamsulosin 0.4 MG Caps capsule Commonly known as:  FLOMAX Take 0.4 mg by mouth daily. 30 min after the same meal, daily   VITAMIN B-12 IJ Inject 1 mL as directed every 30 (thirty) days.   vitamin C 500 MG tablet Commonly known as:  ASCORBIC ACID Take 500 mg by mouth daily.       Vitals:   11/17/15 0429 11/17/15 0946  BP: (!) 179/85 (!) 150/59  Pulse: 94 95  Resp: 18 18  Temp: (!) 96.3 F (35.7 C) (!) 96.1 F (35.6 C)    Skin clean, dry and intact without evidence of skin break down, no evidence of skin tears noted. IV catheter discontinued intact. Site without signs and symptoms  of complications. Dressing and pressure applied. Pt denies pain at this time. No complaints noted.  An After Visit Summary was printed and given to the patient. Patient escorted via stretcher and D/C with to [facility] with Ambulance.  Haywood Lasso BSN, RN [facility] Phone [phone]

## 2015-11-17 NOTE — Clinical Social Work Note (Addendum)
CSW facilitated patient discharge including contacting patient family and facility to confirm patient discharge plans. Clinical information faxed to facility and family agreeable with plan. Patient's daughter will transport him back to [facility] by car because patient does not want to wait on [facility] all day. RN to call report prior to discharge [phone]).  CSW will sign off for now as social work intervention is no longer needed. Please consult [place] again if new needs arise.  Dayton Scrape, [place] [phone]  11:24 am Update: Patient now going by [facility] due to safety concerns when he gets to the facility. They will not have a wheelchair available to get to the car. [facility] called. There are 3 transports in front of him. Patient's daughter notified.  Dayton Scrape, [place]

## 2015-11-17 NOTE — Progress Notes (Signed)
       Patient Name: Paul Baller, MD Date of Encounter: 11/17/2015    SUBJECTIVE: Sleeping in chair still Mild dyspnea relieved with inhaler   TELEMETRY:  A fibrillation with better rate control  80-90's  Vitals:   11/16/15 0828 11/16/15 1729 11/16/15 2103 11/17/15 0429  BP: (!) 154/87 (!) 175/57 (!) 153/69 (!) 179/85  Pulse: 67 79 (!) 58 94  Resp: 18 17 19 18   Temp: 98 F (36.7 C) 98.2 F (36.8 C) 97.7 F (36.5 C) (!) 96.3 F (35.7 C)  TempSrc: Oral Oral Oral Axillary  SpO2: 99% 97% 94% 96%  Weight:      Height:        Intake/Output Summary (Last 24 hours) at 11/17/15 0941 Last data filed at 11/17/15 0600  Gross per 24 hour  Intake              410 ml  Output              600 ml  Net             -190 ml   LABS: Basic Metabolic Panel:  Recent Labs  11/15/15 0326  NA 143  K 4.1  CL 119*  CO2 17*  GLUCOSE 128*  BUN 29*  CREATININE 1.73*  CALCIUM 7.0*   CBC:  Recent Labs  11/15/15 0326 11/16/15 0423  WBC 1.1* 1.0*  NEUTROABS 0.6* 0.5*  HGB 8.0* 8.1*  HCT 25.2* 25.6*  MCV 99.2 98.8  PLT 26* 27*    Radiology/Studies:  No new data  Physical Exam: Blood pressure (!) 179/85, pulse 94, temperature (!) 96.3 F (35.7 C), temperature source Axillary, resp. rate 18, height 5\' 7"  (1.702 m), weight 121 lb 9.6 oz (55.2 kg), SpO2 96 %. Weight change:   Wt Readings from Last 3 Encounters:  11/15/15 121 lb 9.6 oz (55.2 kg)  11/07/15 120 lb (54.4 kg)  10/22/15 120 lb (54.4 kg)   Frail and ill appearing. No respiratory distress. Lungs clear except basilar rales. No murmur or rub on cardiac auscultation. Cor:  Irreg. irreg  ( not on tele )  No edema  ASSESSMENT:  1. Atrial fibrillation with RVR improved with beta blocker  Currently off amiodarone.  HR is well controlled No on anticoagulation due to thrombocytopenis   2. CAD, stable.   3. MM with pancytopenia.  Plan is for DC to [facility] today  Follow up with Dr. Linard Burnett in the  office.    Mertie Moores, MD  11/17/2015 9:42 AM    [address],  [place], [place]  [address] Pager [phone] Phone: [phone]; Fax: [phone]

## 2015-11-17 NOTE — Clinical Social Work Note (Signed)
CSW notified rehab nurse at [facility] that dc order in and will send discharge summary when available. Patient can transport when ready.  Dayton Scrape, [place]

## 2015-11-17 NOTE — Progress Notes (Signed)
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 He is sitting comfortably in his chair and denies any shortness of breath.   Objective:  Vital signs in last 24 hours: Vitals:   11/16/15 0828 11/16/15 1729 11/16/15 2103 11/17/15 0429  BP: (!) 154/87 (!) 175/57 (!) 153/69 (!) 179/85  Pulse: 67 79 (!) 58 94  Resp: '18 17 19 18  '$ Temp: 98 F (36.7 C) 98.2 F (36.8 C) 97.7 F (36.5 C) (!) 96.3 F (35.7 C)  TempSrc: Oral Oral Oral Axillary  SpO2: 99% 97% 94% 96%  Weight:      Height:       24-hour weight change: Weight change:  Intake/Output:  09/08 0701 - 09/09 0700 In: 410 [P.O.:410] Out: 600 [Urine:600]   Physical Exam  Constitutional: He is oriented to person, place, and time. He appears well-developed and well-nourished. No distress.  Cardiovascular: Normal rate and regular rhythm.   No murmur heard. Pulmonary/Chest: Effort normal. No respiratory distress. He has no wheezes. He has no rales.  Abdominal: Soft. He exhibits no distension. There is no tenderness.  Neurological: He is alert and oriented to person, place, and time.  Extremities: some bleeding over left extremity related to chronic icthiosis, no calf tenderness.   Labs: CBC:  Recent Labs Lab 11/12/15 0159 11/13/15 0513 11/14/15 0402 11/15/15 0326 11/16/15 0423  WBC 1.3* 0.7* 0.7* 1.1* 1.0*  NEUTROABS 0.8* 0.4* 0.4* 0.6* 0.5*  HGB 8.6* 8.4* 8.1* 8.0* 8.1*  HCT 26.3* 25.2* 25.4* 25.2* 25.6*  MCV 97.0 96.2 98.4 99.2 98.8  PLT 31* 25* 22* 26* 27*   Metabolic Panel:  Recent Labs Lab 11/11/15 0449 11/12/15 0159 11/13/15 0513 11/14/15 0402 11/14/15 0554 11/15/15 0326  NA 139 141 141 141  --  143  K 3.8 3.6 4.9 3.6  --  4.1  CL 115* 117* 118* 113*  --  119*  CO2 16* 18* 17* 16*  --  17*  GLUCOSE 159* 123* 165* 123*  --  128*  BUN 47* 44* 42* 36*  --  29*  CREATININE 2.38* 2.33* 2.12* 1.84*  --  1.73*  CALCIUM 7.1* 6.9* 6.5* 6.4*  --  7.0*  MG   --   --   --   --  2.0  --    Cardiac Labs: No results for input(s): CKTOTAL, CKMB, CKMBINDEX, TROPIPOC, TROPONINI, BNP in the last 168 hours. BG:  Recent Labs Lab 11/15/15 1703 11/16/15 0825 11/16/15 1102 11/16/15 1726 11/17/15 0739  GLUCAP 148* 96 116* 103* 92   No results found for: HGBA1C  Microbiology: Blood cultures drawn 8/31 no growth 5days  Urine culture taken 8/31 no growth   Imaging: CT head 8/31  No acute intracranial findings. There is moderate generalized atrophy and chronic appearing white matter hypodensities which likely represent small vessel ischemic disease.  Xray lumbar spine 8/31 No acute fracture or subluxation of the lumbar spine. Severe degenerative disc disease, greatest at L5-S1.  Chest Xray 8/31 Cardiomegaly without pulmonary edema or focal consolidation   Medications: Infusions:   Scheduled Medications: . sodium chloride   Intravenous Once  . cholecalciferol  1,000 Units Oral Daily  . dexamethasone  20 mg Oral Q Mon  . feeding supplement (GLUCERNA SHAKE)  237 mL Oral TID BM  . linagliptin  5 mg Oral Daily  . LORazepam  0.5 mg Oral QHS  . metoprolol tartrate  25 mg Oral BID  . pantoprazole  40 mg Oral QODAY  . sodium chloride  500 mL Intravenous Once   And  . sodium chloride  250 mL Intravenous Once  . 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  Active Problems:   Septic shock (HCC)   Febrile neutropenia (HCC)   PAF (paroxysmal atrial fibrillation) (HCC)   Protein-calorie malnutrition, severe  Neutropenic fever: Afebrile again having completed 7-day course of antibiotics for empiric coverage of  Gram negative rods.  - isolation precautions  - follow up CBC   Atrial fibrillation:HR 58-94 -Continue Metoprolol '25mg'$  BID for rate control -Cardiology following, appreciate recommendations  Hypocalcemia:Continue home Vitamin D.   Acute on chronic kidney disease Stage III:Crt at baseline 1.7, resolved.  Multiple myeloma: Per Oncology, may switch to Velcade-based therapy if counts improve though will need close outpatient follow-up. -Continue Decadron 20 mg every monday, holding revlimid for now   Diabetes mellitus Continue CBG monitoring and linagliptin 5 mg daily  Insomnia Continue melatonin and Ativan 0.5 mg and tylenol for bedtime  COPD PFT 2010 FEV1/FVC 66%    -Continue levalbuterol nebs PRN for wheeze  DVTprophylaxis SCDs  PT recommends SNF stay and family has been communicating with [facility]   Dispo: Anticipated discharge in approximately 1-2 day(s).   LOS: 9 days   Ledell Noss, MD 11/17/2015, 9:04 AM Pager: [phone]

## 2015-11-17 NOTE — Telephone Encounter (Signed)
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 If confirmed, I gave the charge nurse an order to resume amiodarone 200 mg daily.  On Monday, we must call [facility] and set up an office ECG for 7 days later if amiodarone was started.

## 2015-11-17 NOTE — Discharge Instructions (Signed)
Thank you for trusting [place] with your medical care!  You were hospitalized for neutropenic fever and treated with empiric antibiotic.   Please take note of the following changes to your medications: Start taking ativan 0.5 mg and tylenol at bedtime  Stop taking revlimid  Continue taking all of your other home medications including dexamethasone 20mg  every Monday   To make sure you are getting better, please make it to the follow-up appointments listed on the first page.  If you have any questions, please call [phone].

## 2015-11-19 ENCOUNTER — Telehealth: Payer: Self-pay | Admitting: *Deleted

## 2015-11-19 LAB — CBC AND DIFFERENTIAL
HEMATOCRIT: 23 % — AB (ref 41–53)
HEMOGLOBIN: 8 g/dL — AB (ref 13.5–17.5)
Platelets: 31 10*3/uL — AB (ref 150–399)
WBC: 1.3 10^3/mL

## 2015-11-19 NOTE — Telephone Encounter (Signed)
Verbal order called to [facility]: [place], RN for CBC/ diff to be faxed to Dr. Benay Spice.

## 2015-11-20 ENCOUNTER — Encounter: Payer: Self-pay | Admitting: Internal Medicine

## 2015-11-20 ENCOUNTER — Other Ambulatory Visit: Payer: Self-pay | Admitting: *Deleted

## 2015-11-20 ENCOUNTER — Other Ambulatory Visit: Payer: Medicare Other

## 2015-11-20 ENCOUNTER — Non-Acute Institutional Stay (SKILLED_NURSING_FACILITY): Payer: Medicare Other | Admitting: Internal Medicine

## 2015-11-20 DIAGNOSIS — D709 Neutropenia, unspecified: Secondary | ICD-10-CM

## 2015-11-20 DIAGNOSIS — M4806 Spinal stenosis, lumbar region: Secondary | ICD-10-CM

## 2015-11-20 DIAGNOSIS — R6521 Severe sepsis with septic shock: Secondary | ICD-10-CM | POA: Diagnosis not present

## 2015-11-20 DIAGNOSIS — N184 Chronic kidney disease, stage 4 (severe): Secondary | ICD-10-CM | POA: Diagnosis not present

## 2015-11-20 DIAGNOSIS — I480 Paroxysmal atrial fibrillation: Secondary | ICD-10-CM

## 2015-11-20 DIAGNOSIS — C9000 Multiple myeloma not having achieved remission: Secondary | ICD-10-CM | POA: Diagnosis not present

## 2015-11-20 DIAGNOSIS — E43 Unspecified severe protein-calorie malnutrition: Secondary | ICD-10-CM

## 2015-11-20 DIAGNOSIS — D61818 Other pancytopenia: Secondary | ICD-10-CM | POA: Diagnosis not present

## 2015-11-20 DIAGNOSIS — J438 Other emphysema: Secondary | ICD-10-CM

## 2015-11-20 DIAGNOSIS — M48061 Spinal stenosis, lumbar region without neurogenic claudication: Secondary | ICD-10-CM

## 2015-11-20 DIAGNOSIS — A419 Sepsis, unspecified organism: Secondary | ICD-10-CM | POA: Diagnosis not present

## 2015-11-20 DIAGNOSIS — E119 Type 2 diabetes mellitus without complications: Secondary | ICD-10-CM

## 2015-11-20 DIAGNOSIS — R5081 Fever presenting with conditions classified elsewhere: Secondary | ICD-10-CM | POA: Diagnosis not present

## 2015-11-20 DIAGNOSIS — D472 Monoclonal gammopathy: Secondary | ICD-10-CM

## 2015-11-20 NOTE — Telephone Encounter (Addendum)
Called to follow up on CBC requested on 9/11. Spoke with Manuela Schwartz, they were unable to send result. They received a fax error message. Gave another fax # to send result to Dr. Benay Spice.  Dr. Benay Spice reviewed CBC. Improved, per MD. Pt to follow up as scheduled 9/14.

## 2015-11-20 NOTE — Progress Notes (Signed)
Patient ID: Paul Baller, MD, male   DOB: February 06, 1925, 82 y.o.   MRN: [id number]  Provider:  Rexene Edison. Mariea Clonts, D.O., C.M.D. Location:   [facility] Room Number: 150 rehab admission Place of Service:  SNF (31)  PCP: Mathews Argyle, MD Patient Care Team: Lajean Manes, MD as PCP - General (Internal Medicine)  Extended Emergency Contact Information Primary Emergency Contact: [name] Address: [address]          [place], [place] [address] [name] of [place] Mobile Phone: [phone] Relation: Son  Code Status: DNR Goals of Care: Advanced Directive information Advanced Directives 11/20/2015  Does patient have an advance directive? Yes  Type of Paramedic of [place];Living will  Does patient want to make changes to advanced directive? -  Copy of advanced directive(s) in chart? Yes  Would patient like information on creating an advanced directive? -   Chief Complaint  Patient presents with  . 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  He was being followed by [name] as an outpatient prior to this admission.  He underwent his bone marrow biopsy on 10/08/15 for further evaluation of pancytopenia, sudden fall in hgb and progressive fall in platelets plus macrocytosis.  He has chronic renal insufficiency and cr was also elevated from baseline.  Unfortunately the bone marrow showed 84% plasma cells, hgb was 8,2, wbc 2900 with 52% neutrophils, platelet count 99K on 7/31. Beta 2 microglobulin was elevated at 6.5.  He had no lytic lesions on skeletal bone survey.  He was officially diagnosed with IgA kappa multiple myeloma on  10/11/15.  Treatment plan was: "Best available treatment for someone in his age range is a combination of dose adjusted lenalidomide (Revlimid) 10 mg daily 21 days on 7 days rest plus dose adjusted dexamethasone 20 mg by mouth weekly. Aspirin prophylaxis in view of increased clotting risk with the Revlimid. Bisphosphonate therapy with Zometa 4 mg IV monthly to inhibit bone breakdown."   He was started on the dexamethasone that day, a transfusion of 2 units prbcs on 8/14 when hgb was 7.6 and received his first revlimid 11/01/15.  8/29, he returned to oncology and had elevation of cr to 2.2 from 1.7, BUN 44 from 26, ca 10.5, alb 3.2, wbc 2200, platelets 72K, hgb stable at 8.6.  [name] reduced the revlimid to every three days and moved up the zometa to tues, 9/5 with f/u planned for 9/18.  He initially presented to the ED with change in mental status and hypotension on 11/08/15.  His blood and urine cultures were negative and he was stabilized with a 7 day course of empiric IV abx (levaquin, vanc, and zosyn) and fluids in the ICU. He was transferred to the floor for monitoring and abx were narrowed to cefepime for gram negative rods.  He came to [place] in stable condition but maintaining significant pancytopenia and neutropenia.  He is afebrile.  He admits to significant weakness and fatigue.  His back and buttocks are bothersome to him when sitting up.  He may be switched to velcade therapy if his counts improve enough for treatment.    He had an  episode of afib 9/4 requiring a dose of amiodarone and this returned him to NSR on 9/8.  His COPD was stable and he was doing well with xopenex nebs at the hospital and requests tid prn here.  His asa and omeprazole were discontinued at the hospital due to his platelets (and he only took omeprazole to protect his stomach from the asa).  He was doing well with ativan and tylenol for sleep at the hospital.  He does not feel like he needs the tylenol now and doesn't  think the ativan really helps.  He has done well on the tradjenta for his diabetes.  Will need monitoring due to his decadron, but, thus far, control has been excellent and bid cbgs seem like too much.    Past Medical History:  Diagnosis Date  . Anemia   . CKD (chronic kidney disease), stage III 05/17/2014  . Coronary artery disease   . Degenerative joint disease   . DYSPNEA ON EXERTION 07/19/2008  . GERD (gastroesophageal reflux disease)   . Hiatal hernia   . Hypertension   . MGUS (monoclonal gammopathy of unknown significance) 01/31/2014   IgA kappa dx 07/22/10   . Monoclonal gammopathy   . Multiple myeloma ([facility]) 10/11/2015   IGA kappa  84% plasma cells BM bx 10/08/15;  IgA 1,226 mg&; FLC ratio 24.25; kappa 402.6; Albumin 3.8, beta-2-miroglobulin 6.5; calcium 9.7; Hb 8.2; BUN 36; Cr 1.7; skeletal survey no lytic lesions 10/11/15  . Pancytopenia ([facility]) 10/03/2015  . Peripheral neuropathy ([facility])   . Prostate cancer ([facility])   . pulmonary hypertension   . Pulmonary hypertension ([facility])   . Spinal stenosis   . SVT (supraventricular tachycardia) ([facility] [facility]) hospitalized 05/17/2014  . Type II diabetes mellitus ([facility])    Past Surgical History:  Procedure Laterality Date  . CHOLECYSTECTOMY    . COLON SURGERY    . CORONARY ANGIOPLASTY    . JOINT REPLACEMENT    . LAMINECTOMY  x 4 last sx 1960's  . TOTAL HIP ARTHROPLASTY Right 11/18/2010    reports that he quit smoking about 41 years ago. His smoking use included Cigarettes. He has never used smokeless tobacco. He reports that he does not drink alcohol or use drugs. Social History   Social History  . Marital status: Married    Spouse name: N/A  . Number of children: N/A  . Years of education: N/A   Occupational History  . Not on file.   Social History Main Topics  . Smoking status: Former Smoker    Types: Cigarettes    Quit date: 02/09/1974  . Smokeless tobacco: Never Used  . Alcohol use No  . Drug use: No  . Sexual activity: No   Other Topics  Concern  . Not on file   Social History Narrative  . No narrative on file    Functional Status Survey:    Family History  Problem Relation Age of Onset  . Heart attack Mother   . Heart attack Father   . Cancer Father   . Stroke Neg Hx     Health Maintenance  Topic Date Due  . HEMOGLOBIN A1C  September 14, 1924  . FOOT EXAM  09/27/1936  . OPHTHALMOLOGY EXAM  09/27/1936  . URINE MICROALBUMIN  09/27/1936  . TETANUS/TDAP  09/27/1945  . ZOSTAVAX  09/28/1986  . PNA vac Low Risk Adult (2 of 2 - PPSV23) 11/09/2014  . INFLUENZA VACCINE  10/09/2015    Allergies  Allergen Reactions  . Meperidine Hcl Other (  See Comments)    REACTION: intol---causes nausea  . Insulins Other (See Comments)    No allergy but patient does NOT want any insulin therapy at this time. He discussed this with MD on 05/18/14      Medication List       Accurate as of 11/20/15 11:57 AM. Always use your most recent med list.          cholecalciferol 1000 units tablet Commonly known as:  VITAMIN D Take 1,000 Units by mouth daily.   dexamethasone 4 MG tablet Commonly known as:  DECADRON Take 5 tablets (20 mg total) by mouth every Monday.   linagliptin 5 MG Tabs tablet Commonly known as:  TRADJENTA Take 5 mg by mouth daily.   LORazepam 0.5 MG tablet Commonly known as:  ATIVAN Take 1 tablet (0.5 mg total) by mouth at bedtime.   metoprolol tartrate 25 MG tablet Commonly known as:  LOPRESSOR Take 37.74m in the am and 275min the evening   pyridOXINE 50 MG tablet Commonly known as:  B-6 Take 50 mg by mouth daily.   tamsulosin 0.4 MG Caps capsule Commonly known as:  FLOMAX Take 0.4 mg by mouth daily. 30 min after the same meal, daily   VITAMIN B-12 IJ Inject 1 mL as directed every 30 (thirty) days.   vitamin C 500 MG tablet Commonly known as:  ASCORBIC ACID Take 500 mg by mouth daily.       Review of Systems  Constitutional: Positive for activity change, fatigue and unexpected weight change.  Negative for appetite change, chills and fever.  HENT: Negative for congestion and sinus pressure.   Eyes: Negative for visual disturbance.  Respiratory: Positive for wheezing. Negative for chest tightness and shortness of breath.        Requests the levalbuterol be resumed  Cardiovascular: Negative for chest pain, palpitations and leg swelling.  Gastrointestinal: Negative for abdominal distention, abdominal pain, blood in stool, constipation, diarrhea, nausea and vomiting.  Genitourinary: Negative for dysuria.  Musculoskeletal: Positive for arthralgias, back pain and gait problem.  Skin: Positive for color change and pallor.  Neurological: Positive for weakness. Negative for dizziness, light-headedness and numbness.  Hematological: Bruises/bleeds easily.  Psychiatric/Behavioral: Positive for sleep disturbance. Negative for confusion and suicidal ideas.    Vitals:   11/20/15 1138  BP: (!) 157/83  Pulse: 63  Resp: (!) 22  SpO2: 93%  Weight: 139 lb (63 kg)   Body mass index is 21.77 kg/m. Physical Exam  Constitutional: He is oriented to person, place, and time. No distress.  HENT:  Head: Normocephalic and atraumatic.  Right Ear: External ear normal.  Mouth/Throat: Oropharynx is clear and moist.  Eyes: Conjunctivae and EOM are normal. Pupils are equal, round, and reactive to light.  Neck: Normal range of motion. Neck supple. No JVD present. No thyromegaly present.  Cardiovascular: Normal rate, regular rhythm, normal heart sounds and intact distal pulses.   Pulmonary/Chest: Effort normal. He has wheezes.  Abdominal: Soft. Bowel sounds are normal. He exhibits no distension and no mass. There is no tenderness. There is no rebound and no guarding.  Musculoskeletal: He exhibits tenderness.  Of lower back; keeps repositioning himself in his chair due to discomfort despite 3 cushions beneath him in the recliner chair  Lymphadenopathy:    He has no cervical adenopathy.  Neurological: He  is alert and oriented to person, place, and time. No cranial nerve deficit.  Skin: Skin is warm and dry.  Pale and slightly jaundice  appearing  Psychiatric: He has a normal mood and affect. His behavior is normal. Judgment and thought content normal.    Labs reviewed: Basic Metabolic Panel:  Recent Labs  11/08/15 0423 11/09/15 0208  11/10/15 0212  11/13/15 0513 11/14/15 0402 11/14/15 0554 11/15/15 0326  NA 136 137  < > 137  < > 141 141  --  143  K 3.9 3.7  < > 4.0  < > 4.9 3.6  --  4.1  CL 105 107  < > 113*  < > 118* 113*  --  119*  CO2 19* 19*  < > 16*  < > 17* 16*  --  17*  GLUCOSE 150* 108*  < > 168*  < > 165* 123*  --  128*  BUN 48* 38*  < > 38*  < > 42* 36*  --  29*  CREATININE 2.55* 2.43*  < > 2.21*  < > 2.12* 1.84*  --  1.73*  CALCIUM 9.0 8.0*  < > 7.5*  < > 6.5* 6.4*  --  7.0*  MG 1.4* 1.4*  --  2.1  --   --   --  2.0  --   PHOS 3.5 3.1  --  4.5  --   --   --   --   --   < > = values in this interval not displayed. Liver Function Tests:  Recent Labs  11/06/15 0855 11/08/15 0320 11/08/15 0423 11/09/15 0208  AST 18 QUESTIONABLE RESULTS, RECOMMEND RECOLLECT TO VERIFY 17  --   ALT 13* QUESTIONABLE RESULTS, RECOMMEND RECOLLECT TO VERIFY 11*  --   ALKPHOS 121 QUESTIONABLE RESULTS, RECOMMEND RECOLLECT TO VERIFY 82  --   BILITOT 2.6* QUESTIONABLE RESULTS, RECOMMEND RECOLLECT TO VERIFY 2.0*  --   PROT 7.6 QUESTIONABLE RESULTS, RECOMMEND RECOLLECT TO VERIFY 5.7*  --   ALBUMIN 3.2* QUESTIONABLE RESULTS, RECOMMEND RECOLLECT TO VERIFY 2.5* 2.1*   No results for input(s): LIPASE, AMYLASE in the last 8760 hours. No results for input(s): AMMONIA in the last 8760 hours. CBC:  Recent Labs  11/14/15 0402 11/15/15 0326 11/16/15 0423 11/19/15 0852  WBC 0.7* 1.1* 1.0* 1.3  NEUTROABS 0.4* 0.6* 0.5*  --   HGB 8.1* 8.0* 8.1* 8.0*  HCT 25.4* 25.2* 25.6* 23*  MCV 98.4 99.2 98.8  --   PLT 22* 26* 27* 31*   Cardiac Enzymes:  Recent Labs  11/08/15 1259 11/08/15 2039  11/09/15 0109  TROPONINI 0.47* 0.68* 0.68*   BNP: Invalid input(s): POCBNP No results found for: HGBA1C Lab Results  Component Value Date   TSH 0.918 05/17/2014   No results found for: VITAMINB12 No results found for: FOLATE Lab Results  Component Value Date   IRON 69 07/22/2010   TIBC 332 07/22/2010   FERRITIN 130 07/22/2010    Imaging and Procedures obtained prior to SNF admission: Dg Chest 1 View  Result Date: 11/08/2015 CLINICAL DATA:  Generalized weakness.  Fever and nausea.  Sepsis. EXAM: CHEST 1 VIEW COMPARISON:  Chest radiograph 10/11/2015 FINDINGS: The cardiac silhouette is enlarged, likely exaggerated by AP technique. No focal airspace consolidation. No overt pulmonary edema. No pneumothorax or sizable pleural effusion. IMPRESSION: Cardiomegaly without pulmonary edema or focal consolidation. Electronically Signed   By: Ulyses Jarred M.D.   On: 11/08/2015 04:00   Dg Lumbar Spine Complete  Result Date: 11/08/2015 CLINICAL DATA:  Sepsis.  Generalized weakness. EXAM: LUMBAR SPINE - COMPLETE 4+ VIEW COMPARISON:  Lumbar spine radiograph 10/11/2015 FINDINGS: No evidence of fracture  or acute subluxation. There is extensive lumbar degenerative disease including multilevel disc space narrowing and mild vertebral body height loss. Disc space narrowing is most severe at L5-S1. Multiple large anterior and lateral osteophytes are noted. Grade 1 retrolisthesis at L3-L4. Multilevel facet arthrosis. There is an incompletely visualized right total hip arthroplasty. Cholecystectomy clips are noted. Multiple pelvic surgical clips are seen. IMPRESSION: No acute fracture or subluxation of the lumbar spine. Severe degenerative disc disease, greatest at L5-S1. Electronically Signed   By: Ulyses Jarred M.D.   On: 11/08/2015 04:04   Ct Head Wo Contrast  Result Date: 11/08/2015 CLINICAL DATA:  Increasing weakness EXAM: CT HEAD WITHOUT CONTRAST TECHNIQUE: Contiguous axial images were obtained from the  base of the skull through the vertex without intravenous contrast. COMPARISON:  None. FINDINGS: Brain: There is no intracranial hemorrhage, mass or evidence of acute infarction. There is mild generalized atrophy. There is mild chronic microvascular ischemic change. There is no significant extra-axial fluid collection. No acute intracranial findings are evident. Vascular: No hyperdense vessel or unexpected calcification. Skull/Sinuses/Orbits: The calvarium and skullbase are intact. Moderate membrane thickening of the left sphenoid sinus, indeterminate chronicity. IMPRESSION: No acute intracranial findings. There is moderate generalized atrophy and chronic appearing white matter hypodensities which likely represent small vessel ischemic disease. Electronically Signed   By: Andreas Newport M.D.   On: 11/08/2015 04:20   Oncology notes reviewed. Last of Dr. Carlyle Lipa h/ps requested.  Assessment/Plan 1. Septic shock ([facility]) -resolved with hydration and empiric iv abx for 7 days for gram negative rods  2. Febrile neutropenia (HCC) -neutropenia persists -on contact precautions, counseled on regular use of hand sanitizer before meals, after touching items and shaking hands with staff to prevent another infection in his immunocompromised state -latest wbc 1.3  3. Multiple myeloma not having achieved remission ([facility]CC) -if counts improve, he may be started on a new treatment, revlimid on hold, got his zometa -notes indicate that [name] has discussed his goals of care to some extent and I opted not to bring it up on our very first meeting--medication was adjusted based on his frail state and worsening lab values also.  I do want to be certain that the treatments are not going to make his qol worse if his goal is quality over quantity.  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  4. Spinal stenosis, lumbar region, without neurogenic claudication -appears oncology has concerns that this  is actually worse due to his myeloma affecting the bone marrow -he is on the decadron for the bone pain -also will start barrier cream due to his sore bottom  5. Protein-calorie malnutrition, severe -has been getting worse, he is weak and frail, will encourage supplements and po intake, especially adequate hydration  6. Pancytopenia ([facility]) -f/u labs show stability, but not improvement of his counts thus far, keep f/u appt with Dr. Learta Codding later on -last platelets today 31K -he is off asa   7. CKD (chronic kidney disease) stage 4, GFR 15-29 ml/min (HCC) -had worsened to stage 4 with myeloma development and revlimid -encourage hydration and monitor per oncology recs  8. Controlled type 2 diabetes mellitus without complication, without long-term current use of insulin (HCC) -cont tradjenta and check cbgs prn any changes with him or fever development  9. Other emphysema (HCC) -add levalbuterol nebs tid prn wheezing or sob  10. PAF (paroxysmal atrial fibrillation) ([facility]) -converted with amiodarone at hospital, off his asa due to his low platelets and on  lopressor for rate control -will monitor for hypotension and make changes or hold if needed  Family/ staff Communication: discussed with rehab nurse, saw pt's daughter, but she was on her way out of unit after bringing his hs chardonnay  Labs/tests ordered:  No new, await visit with Dr. Learta Codding to determine next labs

## 2015-11-22 ENCOUNTER — Other Ambulatory Visit (HOSPITAL_BASED_OUTPATIENT_CLINIC_OR_DEPARTMENT_OTHER): Payer: Medicare Other

## 2015-11-22 ENCOUNTER — Telehealth: Payer: Self-pay | Admitting: Oncology

## 2015-11-22 ENCOUNTER — Ambulatory Visit (HOSPITAL_BASED_OUTPATIENT_CLINIC_OR_DEPARTMENT_OTHER): Payer: Medicare Other | Admitting: Nurse Practitioner

## 2015-11-22 ENCOUNTER — Telehealth: Payer: Self-pay | Admitting: Nurse Practitioner

## 2015-11-22 VITALS — BP 156/70 | HR 69 | Temp 98.0°F | Resp 18 | Ht 67.0 in | Wt 139.1 lb

## 2015-11-22 DIAGNOSIS — C9000 Multiple myeloma not having achieved remission: Secondary | ICD-10-CM | POA: Diagnosis present

## 2015-11-22 DIAGNOSIS — M549 Dorsalgia, unspecified: Secondary | ICD-10-CM

## 2015-11-22 DIAGNOSIS — D61818 Other pancytopenia: Secondary | ICD-10-CM | POA: Diagnosis not present

## 2015-11-22 DIAGNOSIS — D472 Monoclonal gammopathy: Secondary | ICD-10-CM

## 2015-11-22 DIAGNOSIS — G893 Neoplasm related pain (acute) (chronic): Secondary | ICD-10-CM

## 2015-11-22 LAB — CBC WITH DIFFERENTIAL/PLATELET
BASO%: 0 % (ref 0.0–2.0)
BASOS ABS: 0 10*3/uL (ref 0.0–0.1)
EOS%: 4.5 % (ref 0.0–7.0)
Eosinophils Absolute: 0.1 10*3/uL (ref 0.0–0.5)
HEMATOCRIT: 23.3 % — AB (ref 38.4–49.9)
HGB: 7.8 g/dL — ABNORMAL LOW (ref 13.0–17.1)
LYMPH#: 0.5 10*3/uL — AB (ref 0.9–3.3)
LYMPH%: 33.6 % (ref 14.0–49.0)
MCH: 31.6 pg (ref 27.2–33.4)
MCHC: 33.5 g/dL (ref 32.0–36.0)
MCV: 94.3 fL (ref 79.3–98.0)
MONO#: 0 10*3/uL — AB (ref 0.1–0.9)
MONO%: 1.5 % (ref 0.0–14.0)
NEUT#: 0.8 10*3/uL — ABNORMAL LOW (ref 1.5–6.5)
NEUT%: 60.4 % (ref 39.0–75.0)
PLATELETS: 40 10*3/uL — AB (ref 140–400)
RBC: 2.47 10*6/uL — AB (ref 4.20–5.82)
RDW: 20.4 % — ABNORMAL HIGH (ref 11.0–14.6)
WBC: 1.3 10*3/uL — ABNORMAL LOW (ref 4.0–10.3)

## 2015-11-22 LAB — COMPREHENSIVE METABOLIC PANEL
ALK PHOS: 128 U/L (ref 40–150)
ALT: 22 U/L (ref 0–55)
AST: 16 U/L (ref 5–34)
Albumin: 2.4 g/dL — ABNORMAL LOW (ref 3.5–5.0)
Anion Gap: 9 mEq/L (ref 3–11)
BILIRUBIN TOTAL: 1.02 mg/dL (ref 0.20–1.20)
BUN: 35.8 mg/dL — AB (ref 7.0–26.0)
CALCIUM: 8.4 mg/dL (ref 8.4–10.4)
CO2: 26 mEq/L (ref 22–29)
Chloride: 111 mEq/L — ABNORMAL HIGH (ref 98–109)
Creatinine: 1.7 mg/dL — ABNORMAL HIGH (ref 0.7–1.3)
EGFR: 35 mL/min/{1.73_m2} — AB (ref >90)
GLUCOSE: 154 mg/dL — AB (ref 70–140)
Potassium: 4.2 mEq/L (ref 3.5–5.1)
Sodium: 146 mEq/L — ABNORMAL HIGH (ref 136–145)
Total Protein: 6.1 g/dL — ABNORMAL LOW (ref 6.4–8.3)

## 2015-11-22 NOTE — Telephone Encounter (Signed)
Dr Benay Spice, I am one of Dr Azucena Freed IM partners. He indicated you had agreed to cover for this pt in his absence. Dr Synthia Innocent most recent note stated "Change Revlimid to one 10 mg tab every three days: for this cycle take revlimid on 8/31,9/3,9/6 then stop until 9/14." I could find no recs on what to do after 9/14. Pt called asking for refill. Can you help please?  Thanks Larey Dresser

## 2015-11-22 NOTE — Telephone Encounter (Signed)
Request refill on Revlimid.

## 2015-11-22 NOTE — Telephone Encounter (Signed)
Rezlinidme refill  [employer]

## 2015-11-22 NOTE — Telephone Encounter (Signed)
Dr Darnell Level checked in his inbasket. 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 And did he go to appt with Dr Benay Spice today?

## 2015-11-22 NOTE — Telephone Encounter (Signed)
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 He did mention Dr Benay Spice name and according to [facility], he did see Dr Gearldine Shown office today. I also called home #- Lattie Haw, his daughter answered. Told her about Revlimid which she knew not to refill; stated [employer] must had sent refill over not pt. Stated he's in rehab.

## 2015-11-22 NOTE — Telephone Encounter (Signed)
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 We are holding his revlimid since he got neutropenic & septic. He was supposed to see Dr Benay Spice today. He is considering changing to Velcade if counts have recovered. Dr Viona Gilmore does not need a refill on Revlimid. He only took 5 doses of a 28 day Rx.

## 2015-11-22 NOTE — Progress Notes (Addendum)
  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 He was discharged from the hospital 11/17/2015. He continues weekly Decadron. Revlimid placed on hold due to pancytopenia. Blood counts were improved 11/19/2015.  He denies fever. No nausea or vomiting. No further diarrhea. He has shortness of breath with exertion. He notes bilateral leg swelling. He attributes this to the IV fluids he received in the hospital. His appetite is poor. He has chronic back pain. The caregiver accompanying him reports he has a painful sacral ulcer.  Objective:  Vital signs in last 24 hours:  Blood pressure (!) 156/70, pulse 69, temperature 98 F (36.7 C), temperature source Oral, resp. rate 18, height '5\' 7"'$  (1.702 m), weight 139 lb 1.6 oz (63.1 kg), SpO2 99 %.    HEENT: No thrush or ulcers. Resp: Distant breath sounds. Cardio: Irregular. GI: Abdomen soft and nontender. No hepatomegaly. Vascular: Pitting edema below the knees bilaterally. Skin: Erythema with a small area of superficial ulceration at the sacrum. Skin in general is dry.   Lab Results:  Lab Results  Component Value Date   WBC 1.3 (L) 11/22/2015   HGB 7.8 (L) 11/22/2015   HCT 23.3 (L) 11/22/2015   MCV 94.3 11/22/2015   PLT 40 (L) 11/22/2015   NEUTROABS 0.8 (L) 11/22/2015    Imaging:  No results found.  Medications: I have reviewed the patient's current medications.  Assessment/Plan: 1. Multiple myeloma, IgA kappa, Treatment initiated with Revlimid/dexamethasone 11/01/2015. Current treatment with weekly Decadron. Revlimid on hold secondary to pancytopenia.  2. Admission 11/08/2015 sepsis syndrome, cultures negative. Discharged 11/17/2015.  3. Pancytopenia secondary to multiple myeloma and sepsis-improved 11/19/2015, 11/22/2015.  4. COPD  5.  Atrial fibrillation  6.  Acute/Chronic renal failure  7.  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 Treatment initiated with Revlimid/dexamethasone 11/01/2015. He subsequently developed severe pancytopenia. Revlimid placed on hold; weekly dexamethasone continued. He was admitted with sepsis 11/08/2015, cultures negative. He was discharged on 11/17/2015. Blood counts showed improvement on 11/19/2015 with further improvement today.  He will continue weekly dexamethasone. If counts continue to improve the plan is to initiate treatment with Velcade.   We requested the skin care nurse at the facility evaluate the sacral pressure ulcer.  He will return for a follow-up visit, labs, possible Velcade 11/28/2015. He will contact the office in the interim with any problems.  Patient seen with Dr. Benay Spice.  Patient seen with Dr. Benay Spice.    Ned Card ANP/GNP-BC   11/22/2015  2:32 PM  This was a shared visit with Ned Card. 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 We decided to continue holding Revlimid. He will continue weekly Decadron. The neutropenia and thrombocytopenia are partially improved compared to hospital admission last week. He will return for an office visit next week with the plan to initiate Velcade therapy if his counts are further improved. We will follow-up on the IgA level from today.  Julieanne Manson, M.D.

## 2015-11-22 NOTE — Telephone Encounter (Signed)
Message sent to chemo scheduler to add chemo. Avs report and appointment schedule given to patient, per 11/22/15 los.

## 2015-11-23 ENCOUNTER — Non-Acute Institutional Stay (SKILLED_NURSING_FACILITY): Payer: Medicare Other | Admitting: Adult Health

## 2015-11-23 ENCOUNTER — Other Ambulatory Visit: Payer: Self-pay | Admitting: Oncology

## 2015-11-23 ENCOUNTER — Telehealth: Payer: Self-pay | Admitting: *Deleted

## 2015-11-23 ENCOUNTER — Encounter: Payer: Self-pay | Admitting: Adult Health

## 2015-11-23 DIAGNOSIS — M4806 Spinal stenosis, lumbar region: Secondary | ICD-10-CM | POA: Diagnosis not present

## 2015-11-23 DIAGNOSIS — M48061 Spinal stenosis, lumbar region without neurogenic claudication: Secondary | ICD-10-CM | POA: Insufficient documentation

## 2015-11-23 DIAGNOSIS — I10 Essential (primary) hypertension: Secondary | ICD-10-CM | POA: Diagnosis not present

## 2015-11-23 LAB — IGA

## 2015-11-23 NOTE — Progress Notes (Signed)
Patient ID: Paul Loffler, MD, male   DOB: [birth year], 82 y.o.   MRN: [id number]  Location:   [facility]   Place of Service:  SNF (31) Provider:   Peggye Ley, ANP [facility] [phone]   Ginette Otto, MD  Patient Care Team: Merlene Laughter, MD as PCP - General (Internal Medicine)  Extended Emergency Contact Information Primary Emergency Contact: Rush Farmer Address: [address]          [place], [place] [address] [place] of [place] Mobile Phone: [phone] Relation: Son  Code Status:   Goals of care: Advanced Directive information Advanced Directives 11/22/2015  Does patient have an advance directive? Yes  Type of Estate agent of [place];Living will  Does patient want to make changes to advanced directive? -  Copy of advanced directive(s) in chart? Yes  Would patient like information on creating an advanced directive? -     Chief Complaint  Patient presents with  . Acute Visit    back pain    HPI:  Pt is a 82 y.o. male seen today for an acute visit for low back pain present for years per patient due to spinal stenosis, now worsening.  Recent Bone survey to evaluate for mets due to a new dx of multiple myeloma showed multilevel degenerative changes of the spine and severe degenerative changes in the left hip but no metastatic dz MRI of the lumbar spine in 2004 showed: SPONDYLOTIC CHANGES HAVE PROGRESSED SLIGHTLY.  REPORTEDLY CLINICALLY, THE PATIENT HAS A RIGHT L-4 RADICULOPATHY.  NOTE THAT AGAIN APPRECIATED IS A DISC HERNIATION ON THE RIGHT AT L3-4 WITH CAUDAL MIGRATION OF A FREE FRAGMENT INTO THE LATERAL RECESS AND MASS EFFECT ON THE RIGHT L-4 NERVE ROOT.  SEE COMMENTS ABOVE FOR ADDITIONAL FINDINGS. MRI of the cervical spine showed multilevel spondylosis most severe at the C3-4 and C4-5 area.   He denies any radicular pain, states most of his pain is in the low back. Staff reports that he grimaces regularly with  ambulation.    Past Medical History:  Diagnosis Date  . Anemia   . CKD (chronic kidney disease), stage III 05/17/2014  . Coronary artery disease   . Degenerative joint disease   . DYSPNEA ON EXERTION 07/19/2008  . GERD (gastroesophageal reflux disease)   . Hiatal hernia   . Hypertension   . MGUS (monoclonal gammopathy of unknown significance) 01/31/2014   IgA kappa dx 07/22/10   . Monoclonal gammopathy   . Multiple myeloma (HCC) 10/11/2015   IGA kappa  84% plasma cells BM bx 10/08/15;  IgA 1,226 mg&; FLC ratio 24.25; kappa 402.6; Albumin 3.8, beta-2-miroglobulin 6.5; calcium 9.7; Hb 8.2; BUN 36; Cr 1.7; skeletal survey no lytic lesions 10/11/15  . Pancytopenia (HCC) 10/03/2015  . Peripheral neuropathy (HCC)   . Prostate cancer (HCC)   . pulmonary hypertension   . Pulmonary hypertension (HCC)   . Spinal stenosis   . SVT (supraventricular tachycardia) (HCC) hospitalized 05/17/2014  . Type II diabetes mellitus (HCC)    Past Surgical History:  Procedure Laterality Date  . CHOLECYSTECTOMY    . COLON SURGERY    . CORONARY ANGIOPLASTY    . JOINT REPLACEMENT    . LAMINECTOMY  x 4 last sx 1960's  . TOTAL HIP ARTHROPLASTY Right 11/18/2010    Allergies  Allergen Reactions  . Meperidine Hcl Other (See Comments)    REACTION: intol---causes nausea  . Insulins Other (See Comments)    No allergy but patient does  NOT want any insulin therapy at this time. He discussed this with MD on 05/18/14      Medication List       Accurate as of 11/23/15 10:41 AM. Always use your most recent med list.          cholecalciferol 1000 units tablet Commonly known as:  VITAMIN D Take 1,000 Units by mouth daily.   dexamethasone 4 MG tablet Commonly known as:  DECADRON Take 5 tablets (20 mg total) by mouth every Monday.   levalbuterol 45 MCG/ACT inhaler Commonly known as:  XOPENEX HFA Inhale 1-2 puffs into the lungs every 6 (six) hours as needed for wheezing or shortness of breath.   linagliptin 5 MG  Tabs tablet Commonly known as:  TRADJENTA Take 5 mg by mouth daily.   LORazepam 0.5 MG tablet Commonly known as:  ATIVAN Take 1 tablet (0.5 mg total) by mouth at bedtime.   metoprolol tartrate 25 MG tablet Commonly known as:  LOPRESSOR Take 37.'5mg'$  in the am and '25mg'$  in the evening   pyridOXINE 50 MG tablet Commonly known as:  B-6 Take 50 mg by mouth daily.   tamsulosin 0.4 MG Caps capsule Commonly known as:  FLOMAX Take 0.4 mg by mouth daily. 30 min after the same meal, daily   VITAMIN B-12 IJ Inject 1 mL as directed every 30 (thirty) days.   vitamin C 500 MG tablet Commonly known as:  ASCORBIC ACID Take 500 mg by mouth daily.       Review of Systems  Constitutional: Positive for activity change and fatigue. Negative for appetite change, chills, diaphoresis, fever and unexpected weight change.  Respiratory: Negative for cough, shortness of breath, wheezing and stridor.   Cardiovascular: Negative for chest pain, palpitations and leg swelling.  Gastrointestinal: Negative for abdominal distention, abdominal pain, constipation and diarrhea.  Genitourinary: Negative for difficulty urinating and dysuria.  Musculoskeletal: Positive for arthralgias, back pain and gait problem. Negative for joint swelling and myalgias.  Neurological: Positive for weakness (general). Negative for dizziness, seizures, syncope, facial asymmetry, speech difficulty and headaches.  Hematological: Negative for adenopathy. Does not bruise/bleed easily.  Psychiatric/Behavioral: Negative for agitation, behavioral problems and confusion.    Immunization History  Administered Date(s) Administered  . Influenza-Unspecified 11/08/2013  . Pneumococcal Conjugate-13 11/08/2013   Pertinent  Health Maintenance Due  Topic Date Due  . HEMOGLOBIN A1C  1924-10-25  . FOOT EXAM  09/27/1936  . OPHTHALMOLOGY EXAM  09/27/1936  . URINE MICROALBUMIN  09/27/1936  . PNA vac Low Risk Adult (2 of 2 - PPSV23) 11/09/2014  .  INFLUENZA VACCINE  10/09/2015   Fall Risk  10/03/2015  Falls in the past year? No   Functional Status Survey:    Vitals:   11/23/15 1039  BP: (!) 167/72  Pulse: (!) 56  Resp: 20  Temp: (!) 96.9 F (36.1 C)  SpO2: 92%  Weight: 139 lb (63 kg)   Body mass index is 21.77 kg/m. Physical Exam  Constitutional: He is oriented to person, place, and time. No distress.  HENT:  Head: Normocephalic and atraumatic.  Neck: Normal range of motion. Neck supple. No JVD present.  Cardiovascular: Normal rate.   No murmur heard. Pulmonary/Chest: Effort normal and breath sounds normal. No respiratory distress. He has no wheezes.  Abdominal: Soft. Bowel sounds are normal. He exhibits no distension. There is no tenderness.  Musculoskeletal: He exhibits no edema or tenderness.  Neurological: He is alert and oriented to person, place, and time. No cranial  nerve deficit.  Skin: Skin is warm and dry. He is not diaphoretic. There is pallor.  Psychiatric: He has a normal mood and affect.    Labs reviewed:  Recent Labs  11/08/15 0423 11/09/15 0208  11/10/15 9833  11/13/15 0513 11/14/15 0402 11/14/15 0554 11/15/15 0326 11/22/15 1341  NA 136 137  < > 137  < > 141 141  --  143 146*  K 3.9 3.7  < > 4.0  < > 4.9 3.6  --  4.1 4.2  CL 105 107  < > 113*  < > 118* 113*  --  119*  --   CO2 19* 19*  < > 16*  < > 17* 16*  --  17* 26  GLUCOSE 150* 108*  < > 168*  < > 165* 123*  --  128* 154*  BUN 48* 38*  < > 38*  < > 42* 36*  --  29* 35.8*  CREATININE 2.55* 2.43*  < > 2.21*  < > 2.12* 1.84*  --  1.73* 1.7*  CALCIUM 9.0 8.0*  < > 7.5*  < > 6.5* 6.4*  --  7.0* 8.4  MG 1.4* 1.4*  --  2.1  --   --   --  2.0  --   --   PHOS 3.5 3.1  --  4.5  --   --   --   --   --   --   < > = values in this interval not displayed.  Recent Labs  11/08/15 0320 11/08/15 0423 11/09/15 0208 11/22/15 1341  AST QUESTIONABLE RESULTS, RECOMMEND RECOLLECT TO VERIFY 17  --  16  ALT QUESTIONABLE RESULTS, RECOMMEND RECOLLECT TO  VERIFY 11*  --  22  ALKPHOS QUESTIONABLE RESULTS, RECOMMEND RECOLLECT TO VERIFY 82  --  128  BILITOT QUESTIONABLE RESULTS, RECOMMEND RECOLLECT TO VERIFY 2.0*  --  1.02  PROT QUESTIONABLE RESULTS, RECOMMEND RECOLLECT TO VERIFY 5.7*  --  6.1*  ALBUMIN QUESTIONABLE RESULTS, RECOMMEND RECOLLECT TO VERIFY 2.5* 2.1* 2.4*    Recent Labs  11/15/15 0326 11/16/15 0423 11/19/15 0852 11/22/15 1341  WBC 1.1* 1.0* 1.3 1.3*  NEUTROABS 0.6* 0.5*  --  0.8*  HGB 8.0* 8.1* 8.0* 7.8*  HCT 25.2* 25.6* 23* 23.3*  MCV 99.2 98.8  --  94.3  PLT 26* 27* 31* 40*   Lab Results  Component Value Date   TSH 0.918 05/17/2014   No results found for: HGBA1C No results found for: CHOL, HDL, LDLCALC, LDLDIRECT, TRIG, CHOLHDL  Significant Diagnostic Results in last 30 days:  Dg Chest 1 View  Result Date: 11/08/2015 CLINICAL DATA:  Generalized weakness.  Fever and nausea.  Sepsis. EXAM: CHEST 1 VIEW COMPARISON:  Chest radiograph 10/11/2015 FINDINGS: The cardiac silhouette is enlarged, likely exaggerated by AP technique. No focal airspace consolidation. No overt pulmonary edema. No pneumothorax or sizable pleural effusion. IMPRESSION: Cardiomegaly without pulmonary edema or focal consolidation. Electronically Signed   By: Ulyses Jarred M.D.   On: 11/08/2015 04:00   Dg Lumbar Spine Complete  Result Date: 11/08/2015 CLINICAL DATA:  Sepsis.  Generalized weakness. EXAM: LUMBAR SPINE - COMPLETE 4+ VIEW COMPARISON:  Lumbar spine radiograph 10/11/2015 FINDINGS: No evidence of fracture or acute subluxation. There is extensive lumbar degenerative disease including multilevel disc space narrowing and mild vertebral body height loss. Disc space narrowing is most severe at L5-S1. Multiple large anterior and lateral osteophytes are noted. Grade 1 retrolisthesis at L3-L4. Multilevel facet arthrosis. There is an incompletely visualized right  total hip arthroplasty. Cholecystectomy clips are noted. Multiple pelvic surgical clips are  seen. IMPRESSION: No acute fracture or subluxation of the lumbar spine. Severe degenerative disc disease, greatest at L5-S1. Electronically Signed   By: Ulyses Jarred M.D.   On: 11/08/2015 04:04   Ct Head Wo Contrast  Result Date: 11/08/2015 CLINICAL DATA:  Increasing weakness EXAM: CT HEAD WITHOUT CONTRAST TECHNIQUE: Contiguous axial images were obtained from the base of the skull through the vertex without intravenous contrast. COMPARISON:  None. FINDINGS: Brain: There is no intracranial hemorrhage, mass or evidence of acute infarction. There is mild generalized atrophy. There is mild chronic microvascular ischemic change. There is no significant extra-axial fluid collection. No acute intracranial findings are evident. Vascular: No hyperdense vessel or unexpected calcification. Skull/Sinuses/Orbits: The calvarium and skullbase are intact. Moderate membrane thickening of the left sphenoid sinus, indeterminate chronicity. IMPRESSION: No acute intracranial findings. There is moderate generalized atrophy and chronic appearing white matter hypodensities which likely represent small vessel ischemic disease. Electronically Signed   By: Andreas Newport M.D.   On: 11/08/2015 04:20    Assessment/Plan 1. Spinal stenosis, lumbar region, without neurogenic claudication Pain worse over time per resident but he is reluctant to take narcotics so a Butrans patch may be a good alternative. His goal is to return home to [place] but pain may be contributing to his decreased mobility.   Butrans 5 mcg change change q 7 days Colace 100 mg BID prn constipation If no improvement will increase dose of Butrans  2. Essential hypertension Slight elevation today may be due to pain, treat as above and follow BP while in rehab   Family/ staff Communication: Discussed with resident and his daughter  Labs/tests ordered:  NA

## 2015-11-23 NOTE — Telephone Encounter (Signed)
She text of the EKG to my phone. He was in sinus rhythm with PACs.

## 2015-11-23 NOTE — Telephone Encounter (Signed)
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 Pt EKG performed last week showed NSR no Afib. Joy sts that the EKG was faxed and reviewed by Dr.Smith.Adv her that I don't see that it was scanned into the pt chart. Amiodarone was not resumed. Spoke with pt and he sts that he feels that he is in NSR. Adv pt that I will fwd the update to Dr.Smith.

## 2015-11-23 NOTE — Telephone Encounter (Signed)
Per LOS I have scheduled appts and notified the scheduler 

## 2015-11-23 NOTE — Progress Notes (Signed)
START ON [facility]IMEN - Multiple Myeloma  [id number]: Vd (Bortezomib 1.3 mg/m2 Subcut D1, 8, 15 + Dexamethasone 20 mg) q21 Days x 8 Cycles   A cycle is every 21 days:     Bortezomib (Velcade(R)) 1.3 mg/m2 subcut weekly on days 1, 8, and 15 Dose Mod: None     Dexamethasone (Decadron(R)) 20 mg orally days 1, 8, and 15 Dose Mod: None Additional Orders: Herpes zoster prophylaxis recommended.  **Always confirm dose/schedule in your pharmacy ordering system**    Patient Characteristics: Newly Diagnosed, Transplant Ineligible or Refused, Standard Risk R-ISS Staging: Unknown Disease Classification: Newly Diagnosed Is Patient Eligible for Transplant? Transplant Ineligible or Refused Risk Status: Standard Risk  Intent of Therapy: Non-Curative / Palliative Intent, Discussed with Patient

## 2015-11-23 NOTE — Progress Notes (Signed)
Patient on plan of care prior to [facility]. 

## 2015-11-26 ENCOUNTER — Encounter: Payer: Self-pay | Admitting: Adult Health

## 2015-11-26 ENCOUNTER — Non-Acute Institutional Stay (SKILLED_NURSING_FACILITY): Payer: Medicare Other | Admitting: Adult Health

## 2015-11-26 DIAGNOSIS — M4806 Spinal stenosis, lumbar region: Secondary | ICD-10-CM | POA: Diagnosis not present

## 2015-11-26 DIAGNOSIS — C9000 Multiple myeloma not having achieved remission: Secondary | ICD-10-CM

## 2015-11-26 DIAGNOSIS — R410 Disorientation, unspecified: Secondary | ICD-10-CM

## 2015-11-26 DIAGNOSIS — M48061 Spinal stenosis, lumbar region without neurogenic claudication: Secondary | ICD-10-CM

## 2015-11-26 NOTE — Progress Notes (Signed)
Patient ID: [name], MD, male   DOB: [birth year], 82 y.o.   MRN: [id number]  Location:   [facility]   Place of Service:  SNF (31) Provider:   Cindi Carbon, [place] [phone]   Mathews Argyle, MD  Patient Care Team: Lajean Manes, MD as PCP - General (Internal Medicine)  Extended Emergency Contact Information Primary Emergency Contact: [name] Address: [address]          [place], [place] [address] [name] of [place] Mobile Phone: [phone] Relation: Son  Code Status:   Goals of care: Advanced Directive information Advanced Directives 11/22/2015  Does patient have an advance directive? Yes  Type of Paramedic of [place];Living will  Does patient want to make changes to advanced directive? -  Copy of advanced directive(s) in chart? Yes  Would patient like information on creating an advanced directive? -     Chief Complaint  Patient presents with  . Altered Mental Status    HPI:  Pt is a 82 y.o. male seen today for an acute visit for increased confusion. He had some mild memory loss upon entering rehab for therapy after a hospital stay due to septic shock with multiple myeloma. MMSE at that time was 28/30.  He reported back pain due to spinal stenosis and was started on Butrans. The following day he seemed more confused and so this was discontinued. He was started on Ultram instead but continued to be confused. He fell on 9/17 but he did not apparently injure himself. He denies pain today but is more confused than normal and keeps repeating himself. The staff reports some urinary frequency. No fever ,sob, cp or cough. Denies back pain today.  NA on 9/14 was 146 and the staff reports that his intake is overall poor.  VS are stable.  He has Multiple myeloma, IgA kappa, Treatment initiated with Revlimid/dexamethasone 11/01/2015. Current treatment with weekly Decadron. Revlimid on hold secondary to  pancytopenia.    Past Medical History:  Diagnosis Date  . Anemia   . CKD (chronic kidney disease), stage III 05/17/2014  . Coronary artery disease   . Degenerative joint disease   . DYSPNEA ON EXERTION 07/19/2008  . GERD (gastroesophageal reflux disease)   . Hiatal hernia   . Hypertension   . MGUS (monoclonal gammopathy of unknown significance) 01/31/2014   IgA kappa dx 07/22/10   . Monoclonal gammopathy   . Multiple myeloma ([place]) 10/11/2015   IGA kappa  84% plasma cells BM bx 10/08/15;  IgA 1,226 mg&; FLC ratio 24.25; kappa 402.6; Albumin 3.8, beta-2-miroglobulin 6.5; calcium 9.7; Hb 8.2; BUN 36; Cr 1.7; skeletal survey no lytic lesions 10/11/15  . Pancytopenia ([place]) 10/03/2015  . Peripheral neuropathy ([place])   . Prostate cancer ([place])   . pulmonary hypertension   . Pulmonary hypertension ([place])   . Spinal stenosis   . SVT (supraventricular tachycardia) ([place]) hospitalized 05/17/2014  . Type II diabetes mellitus ([place])    Past Surgical History:  Procedure Laterality Date  . CHOLECYSTECTOMY    . COLON SURGERY    . CORONARY ANGIOPLASTY    . JOINT REPLACEMENT    . LAMINECTOMY  x 4 last sx 1960's  . TOTAL HIP ARTHROPLASTY Right 11/18/2010    Allergies  Allergen Reactions  . Meperidine Hcl Other (See Comments)    REACTION: intol---causes nausea  . Insulins Other (See Comments)    No allergy but patient does NOT want any insulin therapy at this  time. He discussed this with MD on 05/18/14      Medication List       Accurate as of 11/26/15  9:59 AM. Always use your most recent med list.          cholecalciferol 1000 units tablet Commonly known as:  VITAMIN D Take 1,000 Units by mouth daily.   dexamethasone 4 MG tablet Commonly known as:  DECADRON Take 5 tablets (20 mg total) by mouth every Monday.   levalbuterol 45 MCG/ACT inhaler Commonly known as:  XOPENEX HFA Inhale 1-2 puffs into the lungs every 6 (six) hours as needed for wheezing or shortness of breath.   linagliptin 5 MG  Tabs tablet Commonly known as:  TRADJENTA Take 5 mg by mouth daily.   LORazepam 0.5 MG tablet Commonly known as:  ATIVAN Take 1 tablet (0.5 mg total) by mouth at bedtime.   metoprolol tartrate 25 MG tablet Commonly known as:  LOPRESSOR Take 37.8m in the am and 245min the evening   pyridOXINE 50 MG tablet Commonly known as:  B-6 Take 50 mg by mouth daily.   tamsulosin 0.4 MG Caps capsule Commonly known as:  FLOMAX Take 0.4 mg by mouth daily. 30 min after the same meal, daily   VITAMIN B-12 IJ Inject 1 mL as directed every 30 (thirty) days.   vitamin C 500 MG tablet Commonly known as:  ASCORBIC ACID Take 500 mg by mouth daily.       Review of Systems  Constitutional: Positive for activity change and fatigue. Negative for appetite change, chills, diaphoresis, fever and unexpected weight change.  Respiratory: Negative for cough, shortness of breath, wheezing and stridor.   Cardiovascular: Negative for chest pain, palpitations and leg swelling.  Gastrointestinal: Negative for abdominal distention, abdominal pain, constipation and diarrhea.  Genitourinary: Positive for frequency. Negative for difficulty urinating, dysuria and flank pain.  Musculoskeletal: Positive for arthralgias, back pain and gait problem. Negative for joint swelling and myalgias.  Neurological: Positive for weakness (general). Negative for dizziness, seizures, syncope, facial asymmetry, speech difficulty and headaches.  Hematological: Negative for adenopathy. Does not bruise/bleed easily.  Psychiatric/Behavioral: Positive for agitation and confusion. Negative for behavioral problems.    Immunization History  Administered Date(s) Administered  . Influenza-Unspecified 11/08/2013  . Pneumococcal Conjugate-13 11/08/2013   Pertinent  Health Maintenance Due  Topic Date Due  . HEMOGLOBIN A1C  0712-Dec-1926. FOOT EXAM  09/27/1936  . OPHTHALMOLOGY EXAM  09/27/1936  . URINE MICROALBUMIN  09/27/1936  . PNA vac  Low Risk Adult (2 of 2 - PPSV23) 11/09/2014  . INFLUENZA VACCINE  10/09/2015   Fall Risk  10/03/2015  Falls in the past year? No   Functional Status Survey:    Vitals:   11/26/15 0954  BP: 135/70  Pulse: 78  Resp: 20  Temp: 97.8 F (36.6 C)  SpO2: 93%  Weight: 139 lb (63 kg)   Body mass index is 21.77 kg/m. Physical Exam  Constitutional: No distress.  HENT:  Head: Normocephalic and atraumatic.  Eyes: Pupils are equal, round, and reactive to light. Right eye exhibits no discharge. Left eye exhibits no discharge.  Neck: Normal range of motion. Neck supple. No JVD present.  Cardiovascular: Normal rate.   No murmur heard. Irregular no edema  Pulmonary/Chest: Effort normal and breath sounds normal. No respiratory distress. He has no wheezes.  Abdominal: Soft. Bowel sounds are normal. He exhibits no distension. There is no tenderness.  Musculoskeletal: He exhibits no edema or tenderness.  Grimaces with ambulation, kyphosis noted.   Neurological: He is alert. No cranial nerve deficit.  Oriented to self and place, not situation to time  Skin: Skin is warm and dry. He is not diaphoretic. There is pallor.  Psychiatric:  Easily angered, keeps repeating himself  Nursing note and vitals reviewed.   Labs reviewed:  Recent Labs  11/08/15 0423 11/09/15 0208  11/10/15 4193  11/13/15 0513 11/14/15 0402 11/14/15 0554 11/15/15 0326 11/22/15 1341  NA 136 137  < > 137  < > 141 141  --  143 146*  K 3.9 3.7  < > 4.0  < > 4.9 3.6  --  4.1 4.2  CL 105 107  < > 113*  < > 118* 113*  --  119*  --   CO2 19* 19*  < > 16*  < > 17* 16*  --  17* 26  GLUCOSE 150* 108*  < > 168*  < > 165* 123*  --  128* 154*  BUN 48* 38*  < > 38*  < > 42* 36*  --  29* 35.8*  CREATININE 2.55* 2.43*  < > 2.21*  < > 2.12* 1.84*  --  1.73* 1.7*  CALCIUM 9.0 8.0*  < > 7.5*  < > 6.5* 6.4*  --  7.0* 8.4  MG 1.4* 1.4*  --  2.1  --   --   --  2.0  --   --   PHOS 3.5 3.1  --  4.5  --   --   --   --   --   --   < > =  values in this interval not displayed.  Recent Labs  11/08/15 0320 11/08/15 0423 11/09/15 0208 11/22/15 1341  AST QUESTIONABLE RESULTS, RECOMMEND RECOLLECT TO VERIFY 17  --  16  ALT QUESTIONABLE RESULTS, RECOMMEND RECOLLECT TO VERIFY 11*  --  22  ALKPHOS QUESTIONABLE RESULTS, RECOMMEND RECOLLECT TO VERIFY 82  --  128  BILITOT QUESTIONABLE RESULTS, RECOMMEND RECOLLECT TO VERIFY 2.0*  --  1.02  PROT QUESTIONABLE RESULTS, RECOMMEND RECOLLECT TO VERIFY 5.7*  --  6.1*  ALBUMIN QUESTIONABLE RESULTS, RECOMMEND RECOLLECT TO VERIFY 2.5* 2.1* 2.4*    Recent Labs  11/15/15 0326 11/16/15 0423 11/19/15 0852 11/22/15 1341  WBC 1.1* 1.0* 1.3 1.3*  NEUTROABS 0.6* 0.5*  --  0.8*  HGB 8.0* 8.1* 8.0* 7.8*  HCT 25.2* 25.6* 23* 23.3*  MCV 99.2 98.8  --  94.3  PLT 26* 27* 31* 40*   Lab Results  Component Value Date   TSH 0.918 05/17/2014   No results found for: HGBA1C No results found for: CHOL, HDL, LDLCALC, LDLDIRECT, TRIG, CHOLHDL  Significant Diagnostic Results in last 30 days:  Dg Chest 1 View  Result Date: 11/08/2015 CLINICAL DATA:  Generalized weakness.  Fever and nausea.  Sepsis. EXAM: CHEST 1 VIEW COMPARISON:  Chest radiograph 10/11/2015 FINDINGS: The cardiac silhouette is enlarged, likely exaggerated by AP technique. No focal airspace consolidation. No overt pulmonary edema. No pneumothorax or sizable pleural effusion. IMPRESSION: Cardiomegaly without pulmonary edema or focal consolidation. Electronically Signed   By: Ulyses Jarred M.D.   On: 11/08/2015 04:00   Dg Lumbar Spine Complete  Result Date: 11/08/2015 CLINICAL DATA:  Sepsis.  Generalized weakness. EXAM: LUMBAR SPINE - COMPLETE 4+ VIEW COMPARISON:  Lumbar spine radiograph 10/11/2015 FINDINGS: No evidence of fracture or acute subluxation. There is extensive lumbar degenerative disease including multilevel disc space narrowing and mild vertebral body height loss. Disc space narrowing  is most severe at L5-S1. Multiple large  anterior and lateral osteophytes are noted. Grade 1 retrolisthesis at L3-L4. Multilevel facet arthrosis. There is an incompletely visualized right total hip arthroplasty. Cholecystectomy clips are noted. Multiple pelvic surgical clips are seen. IMPRESSION: No acute fracture or subluxation of the lumbar spine. Severe degenerative disc disease, greatest at L5-S1. Electronically Signed   By: Ulyses Jarred M.D.   On: 11/08/2015 04:04   Ct Head Wo Contrast  Result Date: 11/08/2015 CLINICAL DATA:  Increasing weakness EXAM: CT HEAD WITHOUT CONTRAST TECHNIQUE: Contiguous axial images were obtained from the base of the skull through the vertex without intravenous contrast. COMPARISON:  None. FINDINGS: Brain: There is no intracranial hemorrhage, mass or evidence of acute infarction. There is mild generalized atrophy. There is mild chronic microvascular ischemic change. There is no significant extra-axial fluid collection. No acute intracranial findings are evident. Vascular: No hyperdense vessel or unexpected calcification. Skull/Sinuses/Orbits: The calvarium and skullbase are intact. Moderate membrane thickening of the left sphenoid sinus, indeterminate chronicity. IMPRESSION: No acute intracranial findings. There is moderate generalized atrophy and chronic appearing white matter hypodensities which likely represent small vessel ischemic disease. Electronically Signed   By: Andreas Newport M.D.   On: 11/08/2015 04:20    Assessment/Plan  1. Delirium ? If this is due to ultram, underlying infection, or dehydration -check CBC and BMP stat, also UA C and S -stable VS, no focal deficit, monitor closely while in rehab -d/c ultram for now  2. Spinal stenosis, lumbar region, without neurogenic claudication -very sensitive to narcotics -tylenol 1 gram TID  3. Multiple myeloma not having achieved remission ([place]) -on decadron per onc. -increase risk of infection due to neutropenia   Family/ staff  Communication: Discussed with resident and his daughter  Labs/tests ordered:  CBC BMP UA C and S

## 2015-11-27 ENCOUNTER — Telehealth: Payer: Self-pay | Admitting: *Deleted

## 2015-11-27 ENCOUNTER — Other Ambulatory Visit: Payer: Medicare Other

## 2015-11-27 NOTE — Telephone Encounter (Signed)
Call from Thane Edu from [place] - states [facility] wanted to know what to do about pt's weekly labs; schedule here for labs today. States pt had CBC yesterday and coming to cancer ctr tomorrow for labs/infusion. Talked to Dr Sanjuana Letters  - awared of appt at [facility] for tomorrow; stated no labs needed today and can put weekly labs on hold for now. Called Lorriane Shire back ; relayed message who will call [facility].

## 2015-11-27 NOTE — Telephone Encounter (Signed)
Received call from Manuela Schwartz at [facility] this AM questioning patient's lab and appointment schedule.  Call placed to [facility] at Dr. Azucena Freed office and order received to place weekly labs on hold at this time.  Call placed back to Manuela Schwartz at [facility] to inform her of hold of weekly labs at this time per [name] and to confirm that patient does have appt here at [facility] at 10:15AM tomorrow.  Manuela Schwartz appreciative of call back and has no further questions at this time.

## 2015-11-28 ENCOUNTER — Telehealth: Payer: Self-pay | Admitting: *Deleted

## 2015-11-28 ENCOUNTER — Ambulatory Visit (HOSPITAL_COMMUNITY)
Admission: RE | Admit: 2015-11-28 | Discharge: 2015-11-28 | Disposition: A | Discharge status: Home / Self Care | Payer: Medicare Other | Source: Ambulatory Visit | Attending: Oncology | Admitting: Oncology

## 2015-11-28 ENCOUNTER — Ambulatory Visit (HOSPITAL_BASED_OUTPATIENT_CLINIC_OR_DEPARTMENT_OTHER): Payer: Medicare Other

## 2015-11-28 ENCOUNTER — Ambulatory Visit (HOSPITAL_BASED_OUTPATIENT_CLINIC_OR_DEPARTMENT_OTHER): Payer: Medicare Other | Admitting: Oncology

## 2015-11-28 ENCOUNTER — Other Ambulatory Visit (HOSPITAL_BASED_OUTPATIENT_CLINIC_OR_DEPARTMENT_OTHER): Payer: Medicare Other

## 2015-11-28 VITALS — BP 136/58 | HR 55 | Temp 97.8°F | Resp 16 | Ht 67.0 in | Wt 131.9 lb

## 2015-11-28 VITALS — BP 156/81 | HR 60 | Temp 97.8°F | Resp 17

## 2015-11-28 DIAGNOSIS — D649 Anemia, unspecified: Secondary | ICD-10-CM | POA: Insufficient documentation

## 2015-11-28 DIAGNOSIS — Z5112 Encounter for antineoplastic immunotherapy: Secondary | ICD-10-CM | POA: Diagnosis present

## 2015-11-28 DIAGNOSIS — C9000 Multiple myeloma not having achieved remission: Secondary | ICD-10-CM

## 2015-11-28 DIAGNOSIS — C61 Malignant neoplasm of prostate: Secondary | ICD-10-CM

## 2015-11-28 LAB — PREPARE RBC (CROSSMATCH)

## 2015-11-28 LAB — CBC WITH DIFFERENTIAL/PLATELET
BASO%: 0.5 % (ref 0.0–2.0)
BASOS ABS: 0 10*3/uL (ref 0.0–0.1)
EOS ABS: 0.1 10*3/uL (ref 0.0–0.5)
EOS%: 3.5 % (ref 0.0–7.0)
HEMATOCRIT: 19.9 % — AB (ref 38.4–49.9)
HGB: 6.6 g/dL — CL (ref 13.0–17.1)
LYMPH%: 38.1 % (ref 14.0–49.0)
MCH: 31.4 pg (ref 27.2–33.4)
MCHC: 33.2 g/dL (ref 32.0–36.0)
MCV: 94.8 fL (ref 79.3–98.0)
MONO#: 0 10*3/uL — AB (ref 0.1–0.9)
MONO%: 1 % (ref 0.0–14.0)
NEUT#: 1.2 10*3/uL — ABNORMAL LOW (ref 1.5–6.5)
NEUT%: 56.9 % (ref 39.0–75.0)
NRBC: 0 % (ref 0–0)
PLATELETS: 59 10*3/uL — AB (ref 140–400)
RBC: 2.1 10*6/uL — AB (ref 4.20–5.82)
RDW: 20.8 % — ABNORMAL HIGH (ref 11.0–14.6)
WBC: 2 10*3/uL — ABNORMAL LOW (ref 4.0–10.3)
lymph#: 0.8 10*3/uL — ABNORMAL LOW (ref 0.9–3.3)

## 2015-11-28 LAB — ABO/RH: ABO/RH(D): AB POS

## 2015-11-28 MED ORDER — ACYCLOVIR 200 MG PO CAPS: 200.0000 mg | ORAL_CAPSULE | Freq: Every day | ORAL | Status: AC

## 2015-11-28 MED ORDER — BORTEZOMIB CHEMO SQ INJECTION 3.5 MG (2.5MG/ML)
1.0000 mg/m2 | Freq: Once | INTRAMUSCULAR | Status: AC
  Administered 2015-11-28: 1.75 mg via SUBCUTANEOUS
  Filled 2015-11-28: qty 1.75

## 2015-11-28 MED ORDER — SODIUM CHLORIDE 0.9 % IV SOLN
Freq: Once | INTRAVENOUS | Status: AC
  Administered 2015-11-28: 13:00:00 via INTRAVENOUS

## 2015-11-28 MED ORDER — FUROSEMIDE 20 MG PO TABS
20.0000 mg | ORAL_TABLET | Freq: Once | ORAL | Status: AC
  Administered 2015-11-28: 20 mg via ORAL

## 2015-11-28 MED ORDER — ONDANSETRON HCL 8 MG PO TABS
ORAL_TABLET | ORAL | Status: AC
  Filled 2015-11-28: qty 1

## 2015-11-28 MED ORDER — ONDANSETRON HCL 8 MG PO TABS
8.0000 mg | ORAL_TABLET | Freq: Once | ORAL | Status: AC
  Administered 2015-11-28: 8 mg via ORAL

## 2015-11-28 MED ORDER — SODIUM CHLORIDE 0.9 % IV SOLN: 250.0000 mL | Freq: Once | INTRAVENOUS | Status: DC

## 2015-11-28 NOTE — Telephone Encounter (Signed)
[employer], spoke with Candida Peeling, RN. Informed her of Acyclovir 200 mg ordered by Dr. Benay Spice during visit today. Pt should start this med today for shingles prophylaxis. She voiced understanding, stated they will order this from their pharmacy.

## 2015-11-28 NOTE — Progress Notes (Signed)
Per Dr. Sherrill, it's OK to treat with today's labs. 

## 2015-11-28 NOTE — Addendum Note (Signed)
Addended by: Brien Few on: 11/28/2015 02:36 PM   Modules accepted: Orders

## 2015-11-28 NOTE — Patient Instructions (Signed)
[facility] Discharge Instructions for Patients Receiving Chemotherapy  Today you received the following chemotherapy agents:  Velcade  To help prevent nausea and vomiting after your treatment, we encourage you to take your nausea medication as prescribed.   If you develop nausea and vomiting that is not controlled by your nausea medication, call the clinic.   BELOW ARE SYMPTOMS THAT SHOULD BE REPORTED IMMEDIATELY:  *FEVER GREATER THAN 100.5 F  *CHILLS WITH OR WITHOUT FEVER  NAUSEA AND VOMITING THAT IS NOT CONTROLLED WITH YOUR NAUSEA MEDICATION  *UNUSUAL SHORTNESS OF BREATH  *UNUSUAL BRUISING OR BLEEDING  TENDERNESS IN MOUTH AND THROAT WITH OR WITHOUT PRESENCE OF ULCERS  *URINARY PROBLEMS  *BOWEL PROBLEMS  UNUSUAL RASH Items with * indicate a potential emergency and should be followed up as soon as possible.  Feel free to call the clinic you have any questions or concerns. The clinic phone number is [phone].  Please show the CHEMO ALERT CARD at check-in to the Emergency Department and triage nurse.   

## 2015-11-28 NOTE — Progress Notes (Signed)
  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 He reports exertional dyspnea. No diarrhea or pain. He continues weekly Decadron. He is here today with his daughter.  Objective:  Vital signs in last 24 hours:  Blood pressure (!) 136/58, pulse (!) 55, temperature 97.8 F (36.6 C), temperature source Oral, resp. rate 16, height _0  (1.702 m), weight 131 lb 14.4 oz (59.8 kg), SpO2 98 %.    HEENT: No thrush Resp: Scattered coarse rhonchi at the posterior chest, no respiratory distress Cardio: Regular rate and rhythm GI: No hepatosplenomegaly Vascular: Trace pitting edema at the lower leg and ankle bilaterally   Lab Results:  Lab Results  Component Value Date   WBC 2.0 (L) 11/28/2015   HGB 6.6 (LL) 11/28/2015   HCT 19.9 (L) 11/28/2015   MCV 94.8 11/28/2015   PLT 59 (L) 11/28/2015   NEUTROABS 1.2 (L) 11/28/2015      Medications: I have reviewed the patient's current medications.  Assessment/Plan: 1. Multiple myeloma, IgA kappa, Treatment initiated with Revlimid/dexamethasone 11/01/2015. Revlimid held secondary to severe pancytopenia, weekly Decadron continued  Initiation of weekly Velcade 11/28/2015  2. Admission 11/08/2015 sepsis syndrome, cultures negative. Discharged 11/17/2015.  3. Pancytopenia secondary to multiple myeloma and sepsis-improved 11/19/2015, 11/22/2015.  4. COPD  5. Atrial fibrillation  6. Acute/Chronic renal failure  7. 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 The neutrophils and platelets have partially recovered over the past 2 weeks. He has severe symptomatic anemia. We will arrange for a red cell transfusion today.  The plan is to begin weekly Velcade therapy today. He will continue weekly Decadron. I  reviewed the potential toxicities associated with Velcade including the chance of cytopenias and infection. He agrees to proceed. We will dose reduce the Velcade. He will be placed on acyclovir prophylaxis.  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  Betsy Coder, MD  11/28/2015  12:04 PM

## 2015-11-29 LAB — TYPE AND SCREEN
ABO/RH(D): AB POS
ANTIBODY SCREEN: NEGATIVE
UNIT DIVISION: 0
Unit division: 0

## 2015-11-30 ENCOUNTER — Telehealth: Payer: Self-pay | Admitting: Oncology

## 2015-11-30 NOTE — Telephone Encounter (Signed)
sw [place] @ [facility] to confirm pt 9/27 appt per LOS

## 2015-12-02 ENCOUNTER — Other Ambulatory Visit: Payer: Self-pay | Admitting: Oncology

## 2015-12-04 ENCOUNTER — Other Ambulatory Visit: Payer: Medicare Other

## 2015-12-05 ENCOUNTER — Ambulatory Visit (HOSPITAL_BASED_OUTPATIENT_CLINIC_OR_DEPARTMENT_OTHER): Payer: Medicare Other | Admitting: Oncology

## 2015-12-05 ENCOUNTER — Other Ambulatory Visit (HOSPITAL_BASED_OUTPATIENT_CLINIC_OR_DEPARTMENT_OTHER): Payer: Medicare Other

## 2015-12-05 ENCOUNTER — Ambulatory Visit (HOSPITAL_BASED_OUTPATIENT_CLINIC_OR_DEPARTMENT_OTHER): Payer: Medicare Other | Admitting: Nurse Practitioner

## 2015-12-05 VITALS — BP 156/86 | HR 63 | Temp 98.4°F | Resp 18 | Ht 67.0 in | Wt 127.7 lb

## 2015-12-05 DIAGNOSIS — C9000 Multiple myeloma not having achieved remission: Secondary | ICD-10-CM | POA: Diagnosis present

## 2015-12-05 DIAGNOSIS — Z23 Encounter for immunization: Secondary | ICD-10-CM

## 2015-12-05 DIAGNOSIS — Z5112 Encounter for antineoplastic immunotherapy: Secondary | ICD-10-CM | POA: Diagnosis present

## 2015-12-05 DIAGNOSIS — C61 Malignant neoplasm of prostate: Secondary | ICD-10-CM

## 2015-12-05 LAB — CBC WITH DIFFERENTIAL/PLATELET
BASO%: 0.5 % (ref 0.0–2.0)
BASOS ABS: 0 10*3/uL (ref 0.0–0.1)
EOS ABS: 0.1 10*3/uL (ref 0.0–0.5)
EOS%: 5.8 % (ref 0.0–7.0)
HCT: 26.8 % — ABNORMAL LOW (ref 38.4–49.9)
HGB: 8.9 g/dL — ABNORMAL LOW (ref 13.0–17.1)
LYMPH%: 36.8 % (ref 14.0–49.0)
MCH: 30.8 pg (ref 27.2–33.4)
MCHC: 33.3 g/dL (ref 32.0–36.0)
MCV: 92.5 fL (ref 79.3–98.0)
MONO#: 0 10*3/uL — ABNORMAL LOW (ref 0.1–0.9)
MONO%: 0.8 % (ref 0.0–14.0)
NEUT#: 1 10*3/uL — ABNORMAL LOW (ref 1.5–6.5)
NEUT%: 56.1 % (ref 39.0–75.0)
Platelets: 72 10*3/uL — ABNORMAL LOW (ref 140–400)
RBC: 2.9 10*6/uL — AB (ref 4.20–5.82)
RDW: 22 % — ABNORMAL HIGH (ref 11.0–14.6)
WBC: 1.8 10*3/uL — ABNORMAL LOW (ref 4.0–10.3)
lymph#: 0.7 10*3/uL — ABNORMAL LOW (ref 0.9–3.3)

## 2015-12-05 LAB — BASIC METABOLIC PANEL
ANION GAP: 11 meq/L (ref 3–11)
BUN: 33 mg/dL — ABNORMAL HIGH (ref 7.0–26.0)
CALCIUM: 8.6 mg/dL (ref 8.4–10.4)
CO2: 27 mEq/L (ref 22–29)
CREATININE: 1.7 mg/dL — AB (ref 0.7–1.3)
Chloride: 106 mEq/L (ref 98–109)
EGFR: 36 mL/min/{1.73_m2} — AB (ref >90)
Glucose: 120 mg/dl (ref 70–140)
Potassium: 4.4 mEq/L (ref 3.5–5.1)
SODIUM: 144 meq/L (ref 136–145)

## 2015-12-05 MED ORDER — ONDANSETRON HCL 8 MG PO TABS
8.0000 mg | ORAL_TABLET | Freq: Once | ORAL | Status: AC
  Administered 2015-12-05: 8 mg via ORAL

## 2015-12-05 MED ORDER — BORTEZOMIB CHEMO SQ INJECTION 3.5 MG (2.5MG/ML)
1.0000 mg/m2 | Freq: Once | INTRAMUSCULAR | Status: AC
  Administered 2015-12-05: 1.75 mg via SUBCUTANEOUS
  Filled 2015-12-05: qty 1.75

## 2015-12-05 MED ORDER — INFLUENZA VAC SPLIT QUAD 0.5 ML IM SUSY
0.5000 mL | PREFILLED_SYRINGE | Freq: Once | INTRAMUSCULAR | Status: AC
  Administered 2015-12-05: 0.5 mL via INTRAMUSCULAR
  Filled 2015-12-05: qty 0.5

## 2015-12-05 NOTE — Progress Notes (Signed)
OK to give velcade and give flu shot despite counts per Dr Benay Spice.

## 2015-12-05 NOTE — Progress Notes (Signed)
  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 He completed a first treatment with Velcade one week ago. He denies nausea and diarrhea following chemotherapy. He continues weekly Decadron. He reports an improved energy level. He is participated in physical therapy daily. He was transfused with packed red blood cells on 11/28/2015.  Objective:  Vital signs in last 24 hours:  Blood pressure (!) 156/86, pulse 63, temperature 98.4 F (36.9 C), temperature source Oral, resp. rate 18, height '5\' 7"'$  (1.702 m), weight 127 lb 11.2 oz (57.9 kg), SpO2 99 %.    HEENT: No thrush or ulcers Resp: Rhonchi at the lower posterior chest bilaterally, no respiratory distress Cardio: Regular rate and rhythm GI: No hepatosplenomegaly, nontender Vascular: No leg edema   Lab Results:  Lab Results  Component Value Date   WBC 1.8 (L) 12/05/2015   HGB 8.9 (L) 12/05/2015   HCT 26.8 (L) 12/05/2015   MCV 92.5 12/05/2015   PLT 72 (L) 12/05/2015   NEUTROABS 1.0 (L) 12/05/2015   Potassium 4.4, BUN 33, creatinine 1.7  Medications: I have reviewed the patient's current medications.  Assessment/Plan:  1. Multiple myeloma, IgA kappa, Treatment initiated with Revlimid/dexamethasone 11/01/2015. Revlimid held secondary to severe pancytopenia, weekly Decadron continued  Initiation of weekly Velcade 11/28/2015  2. Admission 11/08/2015 sepsis syndrome, cultures negative.Discharged 11/17/2015.  3. Pancytopenia secondary to multiple myeloma and sepsis-improved 11/19/2015, 11/22/2015.  4. COPD  5. Atrial fibrillation  6. Acute/Chronic renal failure  7. History of hypercalcemia-improved following Zometa 11/07/2015    Disposition:  He has an improved performance status. The platelets are higher and the neutrophils are stable today. The hemoglobin is up after a red cell transfusion last week.  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 He continues weekly Decadron. He received an influenza vaccine today.  He will return for Velcade in one week and an office visit in 2 weeks. We will check the IgA level when he returns in 2 weeks.  Betsy Coder, MD  12/05/2015  10:29 AM

## 2015-12-09 ENCOUNTER — Other Ambulatory Visit: Payer: Self-pay | Admitting: Oncology

## 2015-12-11 ENCOUNTER — Non-Acute Institutional Stay (SKILLED_NURSING_FACILITY): Payer: Medicare Other | Admitting: Internal Medicine

## 2015-12-11 ENCOUNTER — Encounter: Payer: Self-pay | Admitting: Internal Medicine

## 2015-12-11 ENCOUNTER — Other Ambulatory Visit: Payer: Self-pay | Admitting: *Deleted

## 2015-12-11 ENCOUNTER — Other Ambulatory Visit: Payer: Medicare Other

## 2015-12-11 DIAGNOSIS — R64 Cachexia: Secondary | ICD-10-CM | POA: Diagnosis not present

## 2015-12-11 DIAGNOSIS — R310 Gross hematuria: Secondary | ICD-10-CM | POA: Diagnosis not present

## 2015-12-11 DIAGNOSIS — M48061 Spinal stenosis, lumbar region without neurogenic claudication: Secondary | ICD-10-CM

## 2015-12-11 DIAGNOSIS — C9000 Multiple myeloma not having achieved remission: Secondary | ICD-10-CM | POA: Diagnosis not present

## 2015-12-11 DIAGNOSIS — E43 Unspecified severe protein-calorie malnutrition: Secondary | ICD-10-CM | POA: Diagnosis not present

## 2015-12-11 DIAGNOSIS — R410 Disorientation, unspecified: Secondary | ICD-10-CM

## 2015-12-11 DIAGNOSIS — C61 Malignant neoplasm of prostate: Secondary | ICD-10-CM

## 2015-12-11 NOTE — Progress Notes (Signed)
Patient ID: Paul Baller, MD, male   DOB: [birth year], 82 y.o.   MRN: [id number]  Location:  [facility] Room Number: [place] of Service:  SNF (31) Provider:  Myalynn Lingle L. Mariea Clonts, D.O., C.M.D.  Mathews Argyle, MD  Patient Care Team: Lajean Manes, MD as PCP - General (Internal Medicine)  Extended Emergency Contact Information Primary Emergency Contact: [name] Address: [address]          [place], [place] [address] [name] of [place] Mobile Phone: [phone] Relation: Son  Code Status:  DNR Goals of care: Advanced Directive information Advanced Directives 12/11/2015  Does patient have an advance directive? Yes  Type of Advance Directive Out of facility DNR (pink MOST or yellow form);[place];Living will  Does patient want to make changes to advanced directive? -  Copy of advanced directive(s) in chart? Yes  Would patient like information on creating an advanced directive? -  Pre-existing out of facility DNR order (yellow form or pink MOST form) Yellow form placed in chart (order not valid for inpatient use)   Chief Complaint  Patient presents with  . Acute Visit    Rehab, pink tinged urine    HPI:  Pt is a 82 y.o. male seen today for an acute visit for pink-tinged urine.  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  He'd initially hospitalized with febrile neutropenia preadmission.  His initial chemotherapy (revlimid) had to be changed due to his low counts.  Velcade was begun 9/20.  He has been losing weight (some fluid, but primarily muscle and fat), is spending the majority of his days in bed, and his intake remains poor.  Apparently, the impression has been that he is tolerating his new therapy well and he reports feeling better despite the above notations.  Family has come to terms with a need for 24 hr supervision and likely a higher level of care for him  should he leave rehab.  We spoke about his urinary findings and I learned he also was having dysuria, urgency and frequency at this point so a UA c+s has been ordered and fluids pushed.  We discussed that sometimes patients opt NOT to continue on chemotherapy treatments when they continue to lose weight, decline cognitively and functionally especially at 82 yo and that it is an option for him; however, he clearly wanted to continue on treatments through hematology due to his numbers looking better.  He continues to groan in pain when changing positions, but family has refused further medication for this due to his delirium he developed with tramadol and butrans (at the same time that he was recovering from febrile neutropenia).  He did have zometa for hypercalcemia and bone pain.  He's on acyclovir prophylaxis.  9/20 he got prbcs and 9/27, notes from hematology indicate an increase in his performance status from before (not the case during visits here in rehab).    Past Medical History:  Diagnosis Date  . Anemia   . CKD (chronic kidney disease), stage III 05/17/2014  . Coronary artery disease   . Degenerative joint disease   . DYSPNEA ON EXERTION 07/19/2008  . GERD (gastroesophageal reflux disease)   . Hiatal hernia   . Hypertension   . MGUS (monoclonal gammopathy of unknown significance) 01/31/2014   IgA kappa dx 07/22/10   . Monoclonal gammopathy   . Multiple myeloma (HCC) 10/11/2015   IGA kappa  84% plasma cells BM  bx 10/08/15;  IgA 1,226 mg&; FLC ratio 24.25; kappa 402.6; Albumin 3.8, beta-2-miroglobulin 6.5; calcium 9.7; Hb 8.2; BUN 36; Cr 1.7; skeletal survey no lytic lesions 10/11/15  . Pancytopenia ([facility]) 10/03/2015  . Peripheral neuropathy ([place])   . Prostate cancer ([facility])   . pulmonary hypertension   . Pulmonary hypertension   . Spinal stenosis   . SVT (supraventricular tachycardia) ([facility]) hospitalized 05/17/2014  . Type II diabetes mellitus ([facility])    Past Surgical History:  Procedure Laterality  Date  . CHOLECYSTECTOMY    . COLON SURGERY    . CORONARY ANGIOPLASTY    . JOINT REPLACEMENT    . LAMINECTOMY  x 4 last sx 1960's  . TOTAL HIP ARTHROPLASTY Right 11/18/2010    Allergies  Allergen Reactions  . Meperidine Hcl Other (See Comments)    REACTION: intol---causes nausea  . Insulins Other (See Comments)    No allergy but patient does NOT want any insulin therapy at this time. He discussed this with MD on 05/18/14      Medication List       Accurate as of 12/11/15  9:23 AM. Always use your most recent med list.          acyclovir 200 MG capsule Commonly known as:  ZOVIRAX Take 1 capsule (200 mg total) by mouth daily.   cholecalciferol 1000 units tablet Commonly known as:  VITAMIN D Take 1,000 Units by mouth daily.   dexamethasone 4 MG tablet Commonly known as:  DECADRON Take 5 tablets (20 mg total) by mouth every Monday.   levalbuterol 45 MCG/ACT inhaler Commonly known as:  XOPENEX HFA Inhale 1-2 puffs into the lungs every 6 (six) hours as needed for wheezing or shortness of breath.   linagliptin 5 MG Tabs tablet Commonly known as:  TRADJENTA Take 5 mg by mouth daily.   LORazepam 0.5 MG tablet Commonly known as:  ATIVAN Take 1 tablet (0.5 mg total) by mouth at bedtime.   metoprolol tartrate 25 MG tablet Commonly known as:  LOPRESSOR Take 37.'5mg'$  in the am and '25mg'$  in the evening   pyridOXINE 50 MG tablet Commonly known as:  B-6 Take 50 mg by mouth daily.   tamsulosin 0.4 MG Caps capsule Commonly known as:  FLOMAX Take 0.4 mg by mouth daily. 30 min after the same meal, daily   VITAMIN B-12 IJ Inject 1 mL as directed every 30 (thirty) days.   vitamin C 500 MG tablet Commonly known as:  ASCORBIC ACID Take 500 mg by mouth daily.       Review of Systems  Constitutional: Positive for activity change, appetite change, fatigue and unexpected weight change. Negative for chills and fever.  HENT: Negative for congestion.   Eyes: Negative for visual  disturbance.  Respiratory: Negative for chest tightness and shortness of breath.   Cardiovascular: Negative for leg swelling.  Gastrointestinal: Negative for abdominal pain, blood in stool, constipation, nausea and vomiting.  Endocrine: Negative for cold intolerance.  Genitourinary: Positive for dysuria, frequency, hematuria and urgency.  Musculoskeletal: Positive for back pain.  Skin: Positive for color change and pallor.  Allergic/Immunologic: Negative for immunocompromised state.  Neurological: Positive for weakness. Negative for dizziness and headaches.  Hematological: Negative for adenopathy. Bruises/bleeds easily.  Psychiatric/Behavioral: Positive for confusion. Negative for agitation, sleep disturbance and suicidal ideas.    Immunization History  Administered Date(s) Administered  . Influenza,inj,Quad PF,36+ Mos 12/05/2015  . Influenza-Unspecified 11/08/2013  . Pneumococcal Conjugate-13 11/08/2013   Pertinent  Health Maintenance Due  Topic Date Due  . HEMOGLOBIN A1C  12/16/1926  . FOOT EXAM  09/27/1936  . OPHTHALMOLOGY EXAM  09/27/1936  . URINE MICROALBUMIN  09/27/1936  . PNA vac Low Risk Adult (2 of 2 - PPSV23) 11/09/2014  . INFLUENZA VACCINE  Completed   Fall Risk  10/03/2015  Falls in the past year? No   Functional Status Survey:  remains independent, but spends the majority of his day in bed, eating very little, losing weight, in pain  Vitals:   12/11/15 0916  BP: (!) 153/83  Pulse: 77  Resp: 19  Temp: 97.1 F (36.2 C)  TempSrc: Oral  SpO2: 97%  Weight: 119 lb (54 kg)   Body mass index is 18.64 kg/m. Physical Exam  Constitutional: He appears distressed.  Frail white male with cachexia lying in bed, groans in pain with positional change  HENT:  Head: Normocephalic and atraumatic.  Eyes: Pupils are equal, round, and reactive to light.  Neck: Neck supple. No JVD present.  Cardiovascular: Normal rate, regular rhythm, normal heart sounds and intact distal  pulses.   Pulmonary/Chest: Effort normal and breath sounds normal. No respiratory distress.  Abdominal: Soft. Bowel sounds are normal. He exhibits no distension and no mass. There is no tenderness. There is no rebound and no guarding.  Musculoskeletal: He exhibits tenderness. He exhibits no edema.  Weak, using walker to ambulate, has a hard time sitting up in bed  Lymphadenopathy:    He has no cervical adenopathy.  Neurological: He is alert. No cranial nerve deficit. Coordination normal.  Flat affect  Skin: Skin is warm and dry. There is pallor.  Psychiatric: He has a normal mood and affect.    Labs reviewed:  Recent Labs  11/08/15 0423 11/09/15 4696  11/10/15 2952  11/13/15 8413 11/14/15 0402 11/14/15 0554 11/15/15 0326 11/22/15 1341 12/05/15 0949  NA 136 137  < > 137  < > 141 141  --  143 146* 144  K 3.9 3.7  < > 4.0  < > 4.9 3.6  --  4.1 4.2 4.4  CL 105 107  < > 113*  < > 118* 113*  --  119*  --   --   CO2 19* 19*  < > 16*  < > 17* 16*  --  17* 26 27  GLUCOSE 150* 108*  < > 168*  < > 165* 123*  --  128* 154* 120  BUN 48* 38*  < > 38*  < > 42* 36*  --  29* 35.8* 33.0*  CREATININE 2.55* 2.43*  < > 2.21*  < > 2.12* 1.84*  --  1.73* 1.7* 1.7*  CALCIUM 9.0 8.0*  < > 7.5*  < > 6.5* 6.4*  --  7.0* 8.4 8.6  MG 1.4* 1.4*  --  2.1  --   --   --  2.0  --   --   --   PHOS 3.5 3.1  --  4.5  --   --   --   --   --   --   --   < > = values in this interval not displayed.  Recent Labs  11/08/15 0320 11/08/15 0423 11/09/15 0208 11/22/15 1341  AST QUESTIONABLE RESULTS, RECOMMEND RECOLLECT TO VERIFY 17  --  16  ALT QUESTIONABLE RESULTS, RECOMMEND RECOLLECT TO VERIFY 11*  --  22  ALKPHOS QUESTIONABLE RESULTS, RECOMMEND RECOLLECT TO VERIFY 82  --  128  BILITOT QUESTIONABLE RESULTS, RECOMMEND RECOLLECT TO VERIFY 2.0*  --  1.02  PROT QUESTIONABLE RESULTS, RECOMMEND RECOLLECT TO VERIFY 5.7*  --  6.1*  ALBUMIN QUESTIONABLE RESULTS, RECOMMEND RECOLLECT TO VERIFY 2.5* 2.1* 2.4*    Recent  Labs  11/22/15 1341 11/28/15 1013 12/05/15 0949  WBC 1.3* 2.0* 1.8*  NEUTROABS 0.8* 1.2* 1.0*  HGB 7.8* 6.6* 8.9*  HCT 23.3* 19.9* 26.8*  MCV 94.3 94.8 92.5  PLT 40* 59* 72*   Lab Results  Component Value Date   TSH 0.918 05/17/2014   Assessment/Plan 1. Multiple myeloma not having achieved remission ([place]) -continues on chemotherapy per hematology -doubt tumor lysis in his case as cause of pink-tinged urine--more likely UTI given other symptoms  2. Gross hematuria -with frequency, urgency, dysuria so check UA c+s, push po fluids and will treat immediately if he has any VS changes   3. Spinal stenosis, lumbar region, without neurogenic claudication -had chronic pain from this, but much worse since his myeloma and treatments -cont decadron therapy and tylenol, would recommend resuming a narcotic med to help with pain at this point given how uncomfortable he is  4. Delirium -still intermittently confused at this point off narcotics, due to severe illness and meds  5. Protein-calorie malnutrition, severe -is progressing, losing more and more weight, intake very poor  6. Cachexia ([place]) -progressing, wt down about 20 lbs since admission here (some fluid from edema, but mostly due to progressing disease and anorexia)  Family/ staff Communication: discussed with patient and rehab staff  Labs/tests ordered: UA c+s

## 2015-12-12 ENCOUNTER — Other Ambulatory Visit (HOSPITAL_BASED_OUTPATIENT_CLINIC_OR_DEPARTMENT_OTHER): Payer: Medicare Other

## 2015-12-12 ENCOUNTER — Ambulatory Visit (HOSPITAL_BASED_OUTPATIENT_CLINIC_OR_DEPARTMENT_OTHER): Payer: Medicare Other

## 2015-12-12 VITALS — BP 148/83 | HR 77 | Temp 97.8°F | Resp 18

## 2015-12-12 DIAGNOSIS — C9000 Multiple myeloma not having achieved remission: Secondary | ICD-10-CM | POA: Diagnosis present

## 2015-12-12 DIAGNOSIS — C61 Malignant neoplasm of prostate: Secondary | ICD-10-CM

## 2015-12-12 DIAGNOSIS — R310 Gross hematuria: Secondary | ICD-10-CM | POA: Diagnosis not present

## 2015-12-12 DIAGNOSIS — Z5112 Encounter for antineoplastic immunotherapy: Secondary | ICD-10-CM | POA: Diagnosis present

## 2015-12-12 LAB — URINALYSIS, MICROSCOPIC - CHCC
BILIRUBIN (URINE): NEGATIVE
Glucose: NEGATIVE mg/dL
KETONES: NEGATIVE mg/dL
Nitrite: NEGATIVE
PROTEIN: 100 mg/dL
SPECIFIC GRAVITY, URINE: 1.02 (ref 1.003–1.035)
Urobilinogen, UR: 0.2 mg/dL (ref 0.2–1)
pH: 5 (ref 4.6–8.0)

## 2015-12-12 LAB — CBC WITH DIFFERENTIAL/PLATELET
BASO%: 0 % (ref 0.0–2.0)
Basophils Absolute: 0 10*3/uL (ref 0.0–0.1)
EOS ABS: 0.1 10*3/uL (ref 0.0–0.5)
EOS%: 2 % (ref 0.0–7.0)
HCT: 28.2 % — ABNORMAL LOW (ref 38.4–49.9)
HEMOGLOBIN: 9.4 g/dL — AB (ref 13.0–17.1)
LYMPH%: 36.6 % (ref 14.0–49.0)
MCH: 31.4 pg (ref 27.2–33.4)
MCHC: 33.3 g/dL (ref 32.0–36.0)
MCV: 94.3 fL (ref 79.3–98.0)
MONO#: 0.1 10*3/uL (ref 0.1–0.9)
MONO%: 1.4 % (ref 0.0–14.0)
NEUT%: 60 % (ref 39.0–75.0)
NEUTROS ABS: 2.1 10*3/uL (ref 1.5–6.5)
Platelets: 84 10*3/uL — ABNORMAL LOW (ref 140–400)
RBC: 2.99 10*6/uL — AB (ref 4.20–5.82)
RDW: 21.2 % — AB (ref 11.0–14.6)
WBC: 3.5 10*3/uL — AB (ref 4.0–10.3)
lymph#: 1.3 10*3/uL (ref 0.9–3.3)

## 2015-12-12 LAB — BASIC METABOLIC PANEL
Anion Gap: 16 mEq/L — ABNORMAL HIGH (ref 3–11)
BUN: 38.8 mg/dL — ABNORMAL HIGH (ref 7.0–26.0)
CHLORIDE: 102 meq/L (ref 98–109)
CO2: 24 meq/L (ref 22–29)
CREATININE: 1.7 mg/dL — AB (ref 0.7–1.3)
Calcium: 9 mg/dL (ref 8.4–10.4)
EGFR: 36 mL/min/{1.73_m2} — ABNORMAL LOW (ref >90)
Glucose: 151 mg/dl — ABNORMAL HIGH (ref 70–140)
POTASSIUM: 4.1 meq/L (ref 3.5–5.1)
SODIUM: 142 meq/L (ref 136–145)

## 2015-12-12 LAB — MAGNESIUM: MAGNESIUM: 1.9 mg/dL (ref 1.5–2.5)

## 2015-12-12 MED ORDER — ONDANSETRON HCL 8 MG PO TABS
8.0000 mg | ORAL_TABLET | Freq: Once | ORAL | Status: AC
  Administered 2015-12-12: 8 mg via ORAL

## 2015-12-12 MED ORDER — ONDANSETRON HCL 8 MG PO TABS
ORAL_TABLET | ORAL | Status: AC
  Filled 2015-12-12: qty 1

## 2015-12-12 MED ORDER — BORTEZOMIB CHEMO SQ INJECTION 3.5 MG (2.5MG/ML)
1.0000 mg/m2 | Freq: Once | INTRAMUSCULAR | Status: AC
  Administered 2015-12-12: 1.75 mg via SUBCUTANEOUS
  Filled 2015-12-12: qty 1.75

## 2015-12-12 NOTE — Addendum Note (Signed)
Addended by: Brien Few on: 12/12/2015 12:33 PM   Modules accepted: Orders

## 2015-12-12 NOTE — Progress Notes (Signed)
Okay to treat today with BMP results per Dr. Benay Spice.

## 2015-12-12 NOTE — Patient Instructions (Signed)
[facility] Discharge Instructions for Patients Receiving Chemotherapy  Today you received the following chemotherapy agents:  Velcade  To help prevent nausea and vomiting after your treatment, we encourage you to take your nausea medication as prescribed.   If you develop nausea and vomiting that is not controlled by your nausea medication, call the clinic.   BELOW ARE SYMPTOMS THAT SHOULD BE REPORTED IMMEDIATELY:  *FEVER GREATER THAN 100.5 F  *CHILLS WITH OR WITHOUT FEVER  NAUSEA AND VOMITING THAT IS NOT CONTROLLED WITH YOUR NAUSEA MEDICATION  *UNUSUAL SHORTNESS OF BREATH  *UNUSUAL BRUISING OR BLEEDING  TENDERNESS IN MOUTH AND THROAT WITH OR WITHOUT PRESENCE OF ULCERS  *URINARY PROBLEMS  *BOWEL PROBLEMS  UNUSUAL RASH Items with * indicate a potential emergency and should be followed up as soon as possible.  Feel free to call the clinic you have any questions or concerns. The clinic phone number is [phone].  Please show the CHEMO ALERT CARD at check-in to the Emergency Department and triage nurse.   

## 2015-12-13 ENCOUNTER — Other Ambulatory Visit: Payer: Medicare Other

## 2015-12-14 LAB — URINE CULTURE

## 2015-12-16 ENCOUNTER — Other Ambulatory Visit: Payer: Self-pay | Admitting: Oncology

## 2015-12-18 ENCOUNTER — Other Ambulatory Visit: Payer: Medicare Other

## 2015-12-19 ENCOUNTER — Ambulatory Visit (HOSPITAL_BASED_OUTPATIENT_CLINIC_OR_DEPARTMENT_OTHER): Payer: Medicare Other | Admitting: Nurse Practitioner

## 2015-12-19 ENCOUNTER — Ambulatory Visit (HOSPITAL_BASED_OUTPATIENT_CLINIC_OR_DEPARTMENT_OTHER): Payer: Medicare Other

## 2015-12-19 ENCOUNTER — Other Ambulatory Visit (HOSPITAL_BASED_OUTPATIENT_CLINIC_OR_DEPARTMENT_OTHER): Payer: Medicare Other

## 2015-12-19 ENCOUNTER — Telehealth: Payer: Self-pay | Admitting: Oncology

## 2015-12-19 VITALS — BP 134/66 | HR 64 | Temp 97.9°F | Resp 18 | Ht 67.0 in | Wt 116.3 lb

## 2015-12-19 DIAGNOSIS — C9000 Multiple myeloma not having achieved remission: Secondary | ICD-10-CM

## 2015-12-19 DIAGNOSIS — Z5112 Encounter for antineoplastic immunotherapy: Secondary | ICD-10-CM

## 2015-12-19 DIAGNOSIS — D631 Anemia in chronic kidney disease: Secondary | ICD-10-CM | POA: Diagnosis not present

## 2015-12-19 DIAGNOSIS — N189 Chronic kidney disease, unspecified: Secondary | ICD-10-CM

## 2015-12-19 DIAGNOSIS — D630 Anemia in neoplastic disease: Secondary | ICD-10-CM

## 2015-12-19 DIAGNOSIS — D696 Thrombocytopenia, unspecified: Secondary | ICD-10-CM

## 2015-12-19 LAB — URINALYSIS, MICROSCOPIC - CHCC
BILIRUBIN (URINE): NEGATIVE
Glucose: NEGATIVE mg/dL
Ketones: NEGATIVE mg/dL
Nitrite: NEGATIVE
PH: 6 (ref 4.6–8.0)
Protein: 30 mg/dL
SPECIFIC GRAVITY, URINE: 1.015 (ref 1.003–1.035)
Urobilinogen, UR: 0.2 mg/dL (ref 0.2–1)

## 2015-12-19 LAB — CBC WITH DIFFERENTIAL/PLATELET
BASO%: 0 % (ref 0.0–2.0)
Basophils Absolute: 0 10*3/uL (ref 0.0–0.1)
EOS ABS: 0.1 10*3/uL (ref 0.0–0.5)
EOS%: 2.5 % (ref 0.0–7.0)
HCT: 23.2 % — ABNORMAL LOW (ref 38.4–49.9)
HEMOGLOBIN: 7.7 g/dL — AB (ref 13.0–17.1)
LYMPH%: 29 % (ref 14.0–49.0)
MCH: 31 pg (ref 27.2–33.4)
MCHC: 33.2 g/dL (ref 32.0–36.0)
MCV: 93.5 fL (ref 79.3–98.0)
MONO#: 0 10*3/uL — ABNORMAL LOW (ref 0.1–0.9)
MONO%: 0.4 % (ref 0.0–14.0)
NEUT#: 1.6 10*3/uL (ref 1.5–6.5)
NEUT%: 68.1 % (ref 39.0–75.0)
Platelets: 57 10*3/uL — ABNORMAL LOW (ref 140–400)
RBC: 2.48 10*6/uL — ABNORMAL LOW (ref 4.20–5.82)
RDW: 21 % — ABNORMAL HIGH (ref 11.0–14.6)
WBC: 2.4 10*3/uL — ABNORMAL LOW (ref 4.0–10.3)
lymph#: 0.7 10*3/uL — ABNORMAL LOW (ref 0.9–3.3)

## 2015-12-19 LAB — BASIC METABOLIC PANEL
ANION GAP: 11 meq/L (ref 3–11)
BUN: 47.1 mg/dL — ABNORMAL HIGH (ref 7.0–26.0)
CALCIUM: 9.4 mg/dL (ref 8.4–10.4)
CO2: 25 meq/L (ref 22–29)
CREATININE: 2 mg/dL — AB (ref 0.7–1.3)
Chloride: 104 mEq/L (ref 98–109)
EGFR: 29 mL/min/{1.73_m2} — AB (ref >90)
GLUCOSE: 125 mg/dL (ref 70–140)
Potassium: 5.1 mEq/L (ref 3.5–5.1)
SODIUM: 140 meq/L (ref 136–145)

## 2015-12-19 MED ORDER — ONDANSETRON HCL 8 MG PO TABS
ORAL_TABLET | ORAL | Status: AC
  Filled 2015-12-19: qty 1

## 2015-12-19 MED ORDER — BORTEZOMIB CHEMO SQ INJECTION 3.5 MG (2.5MG/ML)
1.0000 mg/m2 | Freq: Once | INTRAMUSCULAR | Status: AC
  Administered 2015-12-19: 1.75 mg via SUBCUTANEOUS
  Filled 2015-12-19: qty 1.75

## 2015-12-19 MED ORDER — ONDANSETRON HCL 8 MG PO TABS
8.0000 mg | ORAL_TABLET | Freq: Once | ORAL | Status: AC
  Administered 2015-12-19: 8 mg via ORAL

## 2015-12-19 NOTE — Progress Notes (Signed)
Ned Card, NP okay to tx pt today with Ctn 2.0, BUN 47.1, plt 57 and Hgb 7.7. Pt to make appointment for PRBC infusion next week.

## 2015-12-19 NOTE — Patient Instructions (Signed)
[facility] Discharge Instructions for Patients Receiving Chemotherapy  Today you received the following chemotherapy agents:  Velcade  To help prevent nausea and vomiting after your treatment, we encourage you to take your nausea medication as prescribed.   If you develop nausea and vomiting that is not controlled by your nausea medication, call the clinic.   BELOW ARE SYMPTOMS THAT SHOULD BE REPORTED IMMEDIATELY:  *FEVER GREATER THAN 100.5 F  *CHILLS WITH OR WITHOUT FEVER  NAUSEA AND VOMITING THAT IS NOT CONTROLLED WITH YOUR NAUSEA MEDICATION  *UNUSUAL SHORTNESS OF BREATH  *UNUSUAL BRUISING OR BLEEDING  TENDERNESS IN MOUTH AND THROAT WITH OR WITHOUT PRESENCE OF ULCERS  *URINARY PROBLEMS  *BOWEL PROBLEMS  UNUSUAL RASH Items with * indicate a potential emergency and should be followed up as soon as possible.  Feel free to call the clinic you have any questions or concerns. The clinic phone number is [phone].  Please show the CHEMO ALERT CARD at check-in to the Emergency Department and triage nurse.   

## 2015-12-19 NOTE — Telephone Encounter (Signed)
Gave [facility] assistant avs report and appointments for October and November.   Assistant aware I have spoken with infusion scheduler re 10/18 infusion and she will add 10/18 blood/velcade for after 9:45 am visit with Dr. Benay Spice.

## 2015-12-19 NOTE — Progress Notes (Addendum)
  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 He completed week 3 Velcade/dexamethasone 12/12/2015. He denies nausea/vomiting. No mouth sores. He does note constipation following each injection. He is taking a laxative. He denies shortness of breath. He reports getting a new antibiotic 2 days ago for urinary symptoms, possibly Cipro. The dysuria has markedly improved. Continued chronic back pain.  Objective:  Vital signs in last 24 hours:  Blood pressure 134/66, pulse 64, temperature 97.9 F (36.6 C), temperature source Oral, resp. rate 18, height _0  (1.702 m), weight 116 lb 4.8 oz (52.8 kg), SpO2 100 %.    HEENT: No thrush or ulcers. Resp: Lungs clear bilaterally. Cardio: Regular rate and rhythm. GI: Abdomen soft and nontender. No organomegaly. Vascular: No leg edema.    Lab Results:  Lab Results  Component Value Date   WBC 2.4 (L) 12/19/2015   HGB 7.7 (L) 12/19/2015   HCT 23.2 (L) 12/19/2015   MCV 93.5 12/19/2015   PLT 57 (L) 12/19/2015   NEUTROABS 1.6 12/19/2015    Imaging:  No results found.  Medications: I have reviewed the patient's current medications.  Assessment/Plan: 1. Multiple myeloma, IgA kappa, Treatment initiated with Revlimid/dexamethasone 11/01/2015. Revlimid held secondary to severe pancytopenia, weekly Decadron continued  Initiation of weekly Velcade 11/28/2015  2. Admission 11/08/2015 sepsis syndrome, cultures negative.Discharged 11/17/2015.  3. Pancytopenia secondary to multiple myeloma and sepsis-improved 11/19/2015, 11/22/2015.  4. COPD  5. Atrial fibrillation  6. Acute/Chronic renal failure  7. 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 Plan to continue weekly Velcade/dexamethasone. We will follow-up on the IgA from today.  He has progressive anemia which is likely  multifactorial secondary to myeloma, Velcade, renal failure. He declines a blood transfusion today. We will arrange for a blood transfusion when he returns in one week.  He also has progressive thrombocytopenia likely related to Velcade.  He will return for a follow-up visit in one week. He will contact the office in the interim with any problems.  Patient seen with Dr. Benay Spice.  Ned Card ANP/GNP-BC   12/19/2015  10:57 AM  This was a shared visit with Ned Card. We will follow-up on the IgA level from today. The plan is to continue Velcade/Decadron. He continues antibiotics for treatment of a urinary tract infection.  Julieanne Manson, M.D.

## 2015-12-20 LAB — IGA

## 2015-12-24 ENCOUNTER — Other Ambulatory Visit: Payer: Self-pay | Admitting: *Deleted

## 2015-12-24 DIAGNOSIS — C61 Malignant neoplasm of prostate: Secondary | ICD-10-CM

## 2015-12-25 ENCOUNTER — Other Ambulatory Visit: Payer: Medicare Other

## 2015-12-26 ENCOUNTER — Ambulatory Visit (HOSPITAL_BASED_OUTPATIENT_CLINIC_OR_DEPARTMENT_OTHER): Payer: Medicare Other

## 2015-12-26 ENCOUNTER — Other Ambulatory Visit (HOSPITAL_BASED_OUTPATIENT_CLINIC_OR_DEPARTMENT_OTHER): Payer: Medicare Other

## 2015-12-26 ENCOUNTER — Ambulatory Visit (HOSPITAL_COMMUNITY)
Admission: RE | Admit: 2015-12-26 | Discharge: 2015-12-26 | Disposition: A | Discharge status: Home / Self Care | Payer: Medicare Other | Source: Ambulatory Visit | Attending: Oncology | Admitting: Oncology

## 2015-12-26 ENCOUNTER — Ambulatory Visit (HOSPITAL_BASED_OUTPATIENT_CLINIC_OR_DEPARTMENT_OTHER): Payer: Medicare Other | Admitting: Oncology

## 2015-12-26 ENCOUNTER — Other Ambulatory Visit: Payer: Self-pay | Admitting: *Deleted

## 2015-12-26 VITALS — BP 123/61 | HR 88 | Temp 97.5°F | Resp 16 | Ht 67.0 in | Wt 113.3 lb

## 2015-12-26 VITALS — BP 143/82 | HR 99 | Temp 98.2°F | Resp 20

## 2015-12-26 DIAGNOSIS — R300 Dysuria: Secondary | ICD-10-CM | POA: Diagnosis not present

## 2015-12-26 DIAGNOSIS — C9000 Multiple myeloma not having achieved remission: Secondary | ICD-10-CM

## 2015-12-26 DIAGNOSIS — C61 Malignant neoplasm of prostate: Secondary | ICD-10-CM

## 2015-12-26 DIAGNOSIS — D649 Anemia, unspecified: Secondary | ICD-10-CM | POA: Insufficient documentation

## 2015-12-26 DIAGNOSIS — Z5112 Encounter for antineoplastic immunotherapy: Secondary | ICD-10-CM

## 2015-12-26 DIAGNOSIS — D61810 Antineoplastic chemotherapy induced pancytopenia: Secondary | ICD-10-CM

## 2015-12-26 DIAGNOSIS — D631 Anemia in chronic kidney disease: Secondary | ICD-10-CM | POA: Diagnosis not present

## 2015-12-26 DIAGNOSIS — N189 Chronic kidney disease, unspecified: Secondary | ICD-10-CM

## 2015-12-26 LAB — COMPREHENSIVE METABOLIC PANEL
ALBUMIN: 2.6 g/dL — AB (ref 3.5–5.0)
ALK PHOS: 174 U/L — AB (ref 40–150)
ALT: 8 U/L (ref 0–55)
AST: 15 U/L (ref 5–34)
Anion Gap: 12 mEq/L — ABNORMAL HIGH (ref 3–11)
BUN: 59.3 mg/dL — AB (ref 7.0–26.0)
CO2: 25 meq/L (ref 22–29)
Calcium: 9.6 mg/dL (ref 8.4–10.4)
Chloride: 101 mEq/L (ref 98–109)
Creatinine: 2.2 mg/dL — ABNORMAL HIGH (ref 0.7–1.3)
EGFR: 26 mL/min/{1.73_m2} — ABNORMAL LOW (ref >90)
GLUCOSE: 116 mg/dL (ref 70–140)
POTASSIUM: 4.3 meq/L (ref 3.5–5.1)
SODIUM: 138 meq/L (ref 136–145)
TOTAL PROTEIN: 8 g/dL (ref 6.4–8.3)
Total Bilirubin: 0.7 mg/dL (ref 0.20–1.20)

## 2015-12-26 LAB — CBC WITH DIFFERENTIAL/PLATELET
BASO%: 0 % (ref 0.0–2.0)
Basophils Absolute: 0 10*3/uL (ref 0.0–0.1)
EOS%: 1.7 % (ref 0.0–7.0)
Eosinophils Absolute: 0 10*3/uL (ref 0.0–0.5)
HCT: 21 % — ABNORMAL LOW (ref 38.4–49.9)
HEMOGLOBIN: 7.2 g/dL — AB (ref 13.0–17.1)
LYMPH%: 30.8 % (ref 14.0–49.0)
MCH: 31.4 pg (ref 27.2–33.4)
MCHC: 34.3 g/dL (ref 32.0–36.0)
MCV: 91.7 fL (ref 79.3–98.0)
MONO#: 0.1 10*3/uL (ref 0.1–0.9)
MONO%: 2.5 % (ref 0.0–14.0)
NEUT%: 65 % (ref 39.0–75.0)
NEUTROS ABS: 1.5 10*3/uL (ref 1.5–6.5)
Platelets: 49 10*3/uL — ABNORMAL LOW (ref 140–400)
RBC: 2.29 10*6/uL — AB (ref 4.20–5.82)
RDW: 21.4 % — AB (ref 11.0–14.6)
WBC: 2.4 10*3/uL — AB (ref 4.0–10.3)
lymph#: 0.7 10*3/uL — ABNORMAL LOW (ref 0.9–3.3)

## 2015-12-26 LAB — PREPARE RBC (CROSSMATCH)

## 2015-12-26 MED ORDER — SODIUM CHLORIDE 0.9% FLUSH
10.0000 mL | INTRAVENOUS | Status: DC | PRN
  Filled 2015-12-26: qty 10

## 2015-12-26 MED ORDER — ONDANSETRON HCL 8 MG PO TABS
8.0000 mg | ORAL_TABLET | Freq: Once | ORAL | Status: AC
  Administered 2015-12-26: 8 mg via ORAL

## 2015-12-26 MED ORDER — ONDANSETRON HCL 8 MG PO TABS
ORAL_TABLET | ORAL | Status: AC
  Filled 2015-12-26: qty 1

## 2015-12-26 MED ORDER — LORAZEPAM 0.5 MG PO TABS: ORAL_TABLET | ORAL | 0 refills | Status: DC

## 2015-12-26 MED ORDER — HEPARIN SOD (PORK) LOCK FLUSH 100 UNIT/ML IV SOLN
500.0000 [IU] | Freq: Every day | INTRAVENOUS | Status: DC | PRN
  Filled 2015-12-26: qty 5

## 2015-12-26 MED ORDER — SODIUM CHLORIDE 0.9 % IV SOLN
250.0000 mL | Freq: Once | INTRAVENOUS | Status: AC
  Administered 2015-12-26: 250 mL via INTRAVENOUS

## 2015-12-26 MED ORDER — BORTEZOMIB CHEMO SQ INJECTION 3.5 MG (2.5MG/ML)
1.0000 mg/m2 | Freq: Once | INTRAMUSCULAR | Status: AC
  Administered 2015-12-26: 1.75 mg via SUBCUTANEOUS
  Filled 2015-12-26: qty 1.75

## 2015-12-26 NOTE — Progress Notes (Signed)
Okay to treat today with creatinine of 2.2,  Per Dr. Benay Spice.

## 2015-12-26 NOTE — Progress Notes (Signed)
Okay to treat today with platelet count of 49 per Dr. Benay Spice.

## 2015-12-26 NOTE — Progress Notes (Signed)
  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 He continues to have urinary urgency. He saw Dr. Gaynelle Arabian yesterday and underwent a cystoscopy (I have not received the cystoscopy note). He continues antibiotics for urinary tract infection and has been placed on medication for bladder spasms. He continues physical therapy. He reports chronic back pain. He has exertional dyspnea. He reports a poor appetite.  He last received Velcade 12/19/2015.  Objective:  Vital signs in last 24 hours:  Blood pressure 123/61, pulse 88, temperature 97.5 F (36.4 C), temperature source Oral, resp. rate 16, height _0  (1.702 m), weight 113 lb 4.8 oz (51.4 kg), SpO2 100 %.    HEENT: No thrush, 1 petechial lesion at the tongue and one at the left buccal mucosa Resp: Distant breath sounds, no respiratory distress Cardio: Regular rate and rhythm GI: No hepatomegaly, nontender Vascular: No leg edema   Lab Results:  Lab Results  Component Value Date   WBC 2.4 (L) 12/26/2015   HGB 7.2 (L) 12/26/2015   HCT 21.0 (L) 12/26/2015   MCV 91.7 12/26/2015   PLT 49 (L) 12/26/2015   NEUTROABS 1.5 12/26/2015   Potassium 4.3, BUN 59.3, currently 2.2, total protein 8  Medications: I have reviewed the patient's current medications.  Assessment/Plan: 1. Multiple myeloma, IgA kappa, Treatment initiated with Revlimid/dexamethasone 11/01/2015. Revlimid held secondary to severe pancytopenia, weekly Decadron continued  Initiation of weekly Velcade 11/28/2015  2. Admission 11/08/2015 sepsis syndrome, cultures negative.Discharged 11/17/2015.  3. Pancytopenia secondary to multiple myeloma and chemotherapy  4. COPD  5. Atrial fibrillation  6. Acute/Chronic renal failure  7. History of hypercalcemia-improved following Zometa 11/07/2015  8.  Klebsiella urinary tract infection 12/12/2015-persistent dysuria,  followed by Dr. Gaynelle Arabian    Disposition:  His overall status appears unchanged. He continues to have dysuria. He was started on medical therapy for bladder spasms yesterday. He is completing a course of Levaquin per Dr. Gaynelle Arabian. We will check urinalysis and culture when he returns next week.  The hemoglobin and platelets are lower again today. This may be related to Velcade or progression/persistence of the myeloma. The IgA level was higher last week. The plan is to add Cytoxan into the chemotherapy regimen if the IgA level is higher when he returns next week.  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 He is scheduled for an office visit on 01/09/2016. He will be transfused with 2 units of packed red blood cells today.  Betsy Coder, MD  12/26/2015  11:37 AM

## 2015-12-26 NOTE — Patient Instructions (Signed)
[facility] Discharge Instructions for Patients Receiving Chemotherapy  Today you received the following chemotherapy agents Velcade.  To help prevent nausea and vomiting after your treatment, we encourage you to take your nausea medication as prescribed.   If you develop nausea and vomiting that is not controlled by your nausea medication, call the clinic.   BELOW ARE SYMPTOMS THAT SHOULD BE REPORTED IMMEDIATELY:  *FEVER GREATER THAN 100.5 F  *CHILLS WITH OR WITHOUT FEVER  NAUSEA AND VOMITING THAT IS NOT CONTROLLED WITH YOUR NAUSEA MEDICATION  *UNUSUAL SHORTNESS OF BREATH  *UNUSUAL BRUISING OR BLEEDING  TENDERNESS IN MOUTH AND THROAT WITH OR WITHOUT PRESENCE OF ULCERS     *URINARY PROBLEMS  *BOWEL PROBLEMS  UNUSUAL RASH Items with * indicate a potential emergency and should be followed up as soon as possible.  Feel free to call the clinic you have any questions or concerns. The clinic phone number is [phone].  Please show the [place] at check-in to the Emergency Department and triage nurse.  Blood Transfusion  A blood transfusion is a procedure in which you receive donated blood through an IV tube. You may need a blood transfusion because of illness, surgery, or injury. The blood may come from a donor, or it may be your own blood that you donated previously. The blood given in a transfusion is made up of different types of cells. You may receive:  Red blood cells. These carry oxygen and replace lost blood.  Platelets. These control bleeding.  Plasma. Thishelps blood to clot. If you have hemophilia or another clotting disorder, you may also receive other types of blood products. LET [facility] CARE PROVIDER KNOW ABOUT:  Any allergies you have.  All medicines you are taking, including vitamins, herbs, eye drops, creams, and over-the-counter medicines.  Previous problems you or members of your family have had with the use of  anesthetics.  Any blood disorders you have.  Previous surgeries you have had.  Any medical conditions you may have.  Any previous reactions you have had during a blood transfusion.  RISKS AND COMPLICATIONS Generally, this is a safe procedure. However, problems may occur, including:  Having an allergic reaction to something in the donated blood.  Fever. This may be a reaction to the white blood cells in the transfused blood.  Iron overload. This can happen from having many transfusions.  Transfusion-related acute lung injury (TRALI). This is a rare reaction that causes lung damage. The cause is not known.TRALI can occur within hours of a transfusion or several days later.  Sudden (acute) or delayed hemolytic reactions. This happens if your blood does not match the cells in your transfusion. Your body's defense system (immune system) may try to attack the new cells. This complication is rare.  Infection. This is rare. BEFORE THE PROCEDURE  You may have a blood test to determine your blood type. This is necessary to know what kind of blood your body will accept.  If you are going to have a planned surgery, you may donate your own blood. This may be done in case you need to have a transfusion.  If you have had an allergic reaction to a transfusion in the past, you may be given medicine to help prevent a reaction. Take this medicine only as directed by your health care provider.  You will have your temperature, blood pressure, and pulse monitored before the transfusion. PROCEDURE   An IV will be started in your hand  or arm.  The bag of donated blood will be attached to your IV tube and given into your vein.  Your temperature, blood pressure, and pulse will be monitored regularly during the transfusion. This monitoring is done to detect early signs of a transfusion reaction.  If you have any signs or symptoms of a reaction, your transfusion will be stopped and you may be given  medicine.  When the transfusion is over, your IV will be removed.  Pressure may be applied to the IV site for a few minutes.  A bandage (dressing) will be applied. The procedure may vary among health care providers and hospitals. AFTER THE PROCEDURE  Your blood pressure, temperature, and pulse will be monitored regularly.   This information is not intended to replace advice given to you by your health care provider. Make sure you discuss any questions you have with your health care provider.   Document Released: 02/22/2000 Document Revised: 03/17/2014 Document Reviewed: 01/04/2014 Elsevier Interactive Patient Education [employer].

## 2015-12-26 NOTE — Telephone Encounter (Signed)
[facility] #[phone] Fax: [phone]  

## 2015-12-27 LAB — TYPE AND SCREEN
ABO/RH(D): AB POS
Antibody Screen: NEGATIVE
Unit division: 0
Unit division: 0

## 2015-12-27 LAB — PSA: PROSTATE SPECIFIC AG, SERUM: 1 ng/mL (ref 0.0–4.0)

## 2015-12-30 ENCOUNTER — Other Ambulatory Visit: Payer: Self-pay | Admitting: Oncology

## 2015-12-31 ENCOUNTER — Encounter: Payer: Self-pay | Admitting: *Deleted

## 2015-12-31 ENCOUNTER — Encounter: Payer: Self-pay | Admitting: Internal Medicine

## 2016-01-01 ENCOUNTER — Other Ambulatory Visit: Payer: Medicare Other

## 2016-01-02 ENCOUNTER — Ambulatory Visit: Payer: Medicare Other

## 2016-01-02 ENCOUNTER — Ambulatory Visit (HOSPITAL_BASED_OUTPATIENT_CLINIC_OR_DEPARTMENT_OTHER): Payer: Medicare Other

## 2016-01-02 DIAGNOSIS — R300 Dysuria: Secondary | ICD-10-CM

## 2016-01-02 DIAGNOSIS — C9000 Multiple myeloma not having achieved remission: Secondary | ICD-10-CM

## 2016-01-02 LAB — CBC WITH DIFFERENTIAL/PLATELET
BASO%: 0 % (ref 0.0–2.0)
BASOS ABS: 0 10*3/uL (ref 0.0–0.1)
EOS ABS: 0.1 10*3/uL (ref 0.0–0.5)
EOS%: 2.8 % (ref 0.0–7.0)
HCT: 24.9 % — ABNORMAL LOW (ref 38.4–49.9)
HGB: 8.4 g/dL — ABNORMAL LOW (ref 13.0–17.1)
LYMPH%: 28.4 % (ref 14.0–49.0)
MCH: 31 pg (ref 27.2–33.4)
MCHC: 33.7 g/dL (ref 32.0–36.0)
MCV: 91.9 fL (ref 79.3–98.0)
MONO#: 0 10*3/uL — ABNORMAL LOW (ref 0.1–0.9)
MONO%: 0.9 % (ref 0.0–14.0)
NEUT#: 1.5 10*3/uL (ref 1.5–6.5)
NEUT%: 67.9 % (ref 39.0–75.0)
PLATELETS: 31 10*3/uL — AB (ref 140–400)
RBC: 2.71 10*6/uL — AB (ref 4.20–5.82)
RDW: 19 % — ABNORMAL HIGH (ref 11.0–14.6)
WBC: 2.2 10*3/uL — ABNORMAL LOW (ref 4.0–10.3)
lymph#: 0.6 10*3/uL — ABNORMAL LOW (ref 0.9–3.3)
nRBC: 0 % (ref 0–0)

## 2016-01-02 LAB — BASIC METABOLIC PANEL
Anion Gap: 11 mEq/L (ref 3–11)
BUN: 46.6 mg/dL — AB (ref 7.0–26.0)
CALCIUM: 9.7 mg/dL (ref 8.4–10.4)
CHLORIDE: 101 meq/L (ref 98–109)
CO2: 25 mEq/L (ref 22–29)
CREATININE: 1.7 mg/dL — AB (ref 0.7–1.3)
EGFR: 35 mL/min/{1.73_m2} — ABNORMAL LOW (ref >90)
Glucose: 160 mg/dl — ABNORMAL HIGH (ref 70–140)
Potassium: 4.7 mEq/L (ref 3.5–5.1)
SODIUM: 138 meq/L (ref 136–145)

## 2016-01-02 LAB — URINALYSIS, MICROSCOPIC - CHCC
BILIRUBIN (URINE): NEGATIVE
GLUCOSE UR CHCC: NEGATIVE mg/dL
KETONES: NEGATIVE mg/dL
Nitrite: NEGATIVE
PH: 5 (ref 4.6–8.0)
PROTEIN: NEGATIVE mg/dL
SPECIFIC GRAVITY, URINE: 1.015 (ref 1.003–1.035)
Urobilinogen, UR: 0.2 mg/dL (ref 0.2–1)

## 2016-01-02 NOTE — Progress Notes (Signed)
CBC reviewed by Ned Card, NP: Hold treatment today for PLT 31k. Follow up as scheduled 11/1. Hassan Rowan, Infusion RN made aware.

## 2016-01-03 ENCOUNTER — Other Ambulatory Visit: Payer: Self-pay | Admitting: *Deleted

## 2016-01-03 DIAGNOSIS — R319 Hematuria, unspecified: Principal | ICD-10-CM

## 2016-01-03 DIAGNOSIS — N390 Urinary tract infection, site not specified: Secondary | ICD-10-CM

## 2016-01-03 LAB — URINE CULTURE

## 2016-01-03 LAB — IGA

## 2016-01-03 NOTE — Progress Notes (Signed)
U/a still has lots of wbc's and moderate bacteria-and now yeast!  Probably best to culture again before Cytoxan. Would hold off antibiotic until c/s returns. ( Has had 2 rounds of Levaquin.)

## 2016-01-08 ENCOUNTER — Other Ambulatory Visit: Payer: Medicare Other

## 2016-01-09 ENCOUNTER — Other Ambulatory Visit: Payer: Medicare Other

## 2016-01-09 ENCOUNTER — Telehealth: Payer: Self-pay | Admitting: *Deleted

## 2016-01-09 ENCOUNTER — Ambulatory Visit: Payer: Medicare Other | Admitting: Nurse Practitioner

## 2016-01-09 ENCOUNTER — Ambulatory Visit: Payer: Medicare Other

## 2016-01-09 NOTE — Telephone Encounter (Signed)
Paul Burnett at [facility] to follow up on lab orders. They will order CBC and CMET for 11/2 AM so as not to incur a stat lab charge for pt. OK, per Dr. Benay Spice.

## 2016-01-09 NOTE — Telephone Encounter (Signed)
Informed Dr. Benay Spice that [facility] called to cancel appointments for today. Order received and called to [facility] for CBC and CMET today, fax to [phone].

## 2016-01-10 ENCOUNTER — Non-Acute Institutional Stay (SKILLED_NURSING_FACILITY): Payer: Medicare Other | Admitting: Adult Health

## 2016-01-10 DIAGNOSIS — C9000 Multiple myeloma not having achieved remission: Secondary | ICD-10-CM | POA: Diagnosis not present

## 2016-01-10 DIAGNOSIS — E119 Type 2 diabetes mellitus without complications: Secondary | ICD-10-CM | POA: Diagnosis not present

## 2016-01-10 DIAGNOSIS — R627 Adult failure to thrive: Secondary | ICD-10-CM | POA: Diagnosis not present

## 2016-01-10 DIAGNOSIS — R410 Disorientation, unspecified: Secondary | ICD-10-CM | POA: Diagnosis not present

## 2016-01-10 DIAGNOSIS — R413 Other amnesia: Secondary | ICD-10-CM | POA: Diagnosis not present

## 2016-01-10 DIAGNOSIS — N3000 Acute cystitis without hematuria: Secondary | ICD-10-CM | POA: Diagnosis not present

## 2016-01-10 DIAGNOSIS — D61818 Other pancytopenia: Secondary | ICD-10-CM | POA: Diagnosis not present

## 2016-01-10 DIAGNOSIS — M48061 Spinal stenosis, lumbar region without neurogenic claudication: Secondary | ICD-10-CM | POA: Diagnosis not present

## 2016-01-10 NOTE — Telephone Encounter (Signed)
Called [facility] to follow up on CBC/CMET. Labs are not resulted yet, per Maudie Mercury. They will fax when available.

## 2016-01-10 NOTE — Telephone Encounter (Signed)
Received lab results from [facility]. Labs reviewed by Dr. Benay Spice. HGB 7.6, offer transfusion 11/3. No schedulers available at this time. Will follow up on 11/3.

## 2016-01-10 NOTE — Progress Notes (Signed)
Patient ID: Paul Baller, MD, male   DOB: 09-09-1924, 82 y.o.   MRN: [id number]  Location:   [facility]   Place of Service:  SNF (31) Provider:   Cindi Carbon, [place] [id number]   Mathews Argyle, MD  Patient Care Team: Lajean Manes, MD as PCP - General (Internal Medicine)  Extended Emergency Contact Information Primary Emergency Contact: [name] Address: [address]          [place], [place] [address] [name] of [place] Mobile Phone: [phone] Relation: Son  Code Status:  DNR Goals of care: Advanced Directive information Advanced Directives 12/26/2015  Does patient have an advance directive? Yes  Type of Advance Directive Out of facility DNR (pink MOST or yellow form)  Does patient want to make changes to advanced directive? -  Copy of advanced directive(s) in chart? -  Would patient like information on creating an advanced directive? -  Pre-existing out of facility DNR order (yellow form or pink MOST form) Yellow form placed in chart (order not valid for inpatient use)     Chief Complaint  Patient presents with  . Acute Visit    confusion  . Medical Management of Chronic Issues    HPI:  Pt is a 82 y.o. male seen today for an acute visit for increased confusion, as well as chronic disease management.  He had some mild memory loss upon entering rehab for therapy after a hospital stay due to septic shock with multiple myeloma. MMSE at that time was 28/30.   He is much more confused and lethargic over the past few days. No fever. CBC and BMP pending.  Currently under the treatment of [facility] for recurrent UTI.  Culture grew resistant Enterococcus >100,000 colonies.  He received 3 days of levaquin for UTI on 10/17.  He has had issues with frequency and bladder spasms necessitating the urine culture.  Currently he is on B and O supp BID. They were TID but reduced due to lethargy. They do seem to help the bladder spasms. Staff  is waiting on a call back from Dr. Arlyn Leak office to treat the UTI.  He denies low abd pain or bladder spasms today.   For my visit today he is agitated, sleepy, and more confused. He is losing weight and has a poor appetite over the past month.  Currently 113 lbs. Has chronic back pain due to lumbar stenosis and moans when out of bed. He tends to refuse meds for pain but has been agreeable to taking ultram at bedtime. He is ambulatory with a walker but seems weaker today and is a one person assist at all times.     He has Multiple myeloma, IgA kappa, Treatment initiated with Revlimid/dexamethasone and then changed to Velcade. Treatment on hold due to low platelets.  Family cancelled apt on 11/2 as he was not feeling well, confused, and they were planning on stopping treatment at this point due to the fact that it was not working (per nurse report).  DM Type II on Tradjenta 5 mg qd, blood sugars 104-113 fasting Past Medical History:  Diagnosis Date  . Anemia   . CKD (chronic kidney disease), stage III 05/17/2014  . Coronary artery disease   . Degenerative joint disease   . DYSPNEA ON EXERTION 07/19/2008  . GERD (gastroesophageal reflux disease)   . Hiatal hernia   . Hypertension   . MGUS (monoclonal gammopathy of unknown significance) 01/31/2014   IgA kappa dx 07/22/10   .  Monoclonal gammopathy   . Multiple myeloma ([place]) 10/11/2015   IGA kappa  84% plasma cells BM bx 10/08/15;  IgA 1,226 mg&; FLC ratio 24.25; kappa 402.6; Albumin 3.8, beta-2-miroglobulin 6.5; calcium 9.7; Hb 8.2; BUN 36; Cr 1.7; skeletal survey no lytic lesions 10/11/15  . Pancytopenia ([place]) 10/03/2015  . Peripheral neuropathy ([place])   . Prostate cancer ([place])   . pulmonary hypertension   . Pulmonary hypertension   . Spinal stenosis   . SVT (supraventricular tachycardia) ([place]) hospitalized 05/17/2014  . Type II diabetes mellitus ([place])    Past Surgical History:  Procedure Laterality Date  . CHOLECYSTECTOMY    . COLON SURGERY     . CORONARY ANGIOPLASTY    . JOINT REPLACEMENT    . LAMINECTOMY  x 4 last sx 1960's  . TOTAL HIP ARTHROPLASTY Right 11/18/2010    Allergies  Allergen Reactions  . Meperidine Hcl Other (See Comments)    REACTION: intol---causes nausea  . Pyridium [Phenazopyridine Hcl]     Hemolytic anemia  . Insulins Other (See Comments)    No allergy but patient does NOT want any insulin therapy at this time. He discussed this with MD on 05/18/14      Medication List       Accurate as of 01/10/16 11:37 AM. Always use your most recent med list.          acyclovir 200 MG capsule Commonly known as:  ZOVIRAX Take 1 capsule (200 mg total) by mouth daily.   cholecalciferol 1000 units tablet Commonly known as:  VITAMIN D Take 1,000 Units by mouth daily.   dexamethasone 4 MG tablet Commonly known as:  DECADRON Take 5 tablets (20 mg total) by mouth every Monday.   docusate sodium 100 MG capsule Commonly known as:  COLACE Take 100 mg by mouth 2 (two) times daily.   levalbuterol 45 MCG/ACT inhaler Commonly known as:  XOPENEX HFA Inhale 1-2 puffs into the lungs every 6 (six) hours as needed for wheezing or shortness of breath.   linagliptin 5 MG Tabs tablet Commonly known as:  TRADJENTA Take 5 mg by mouth daily.   LORazepam 1 MG tablet Commonly known as:  ATIVAN Take 1 mg by mouth at bedtime.   metoprolol tartrate 25 MG tablet Commonly known as:  LOPRESSOR Take 37.'5mg'$  in the am and '25mg'$  in the evening   MYRBETRIQ 50 MG Tb24 tablet Generic drug:  mirabegron ER Take 50 mg by mouth daily.   opium-belladonna 16.2-60 MG suppository Commonly known as:  B&O SUPPRETTES Place 1 suppository rectally 2 (two) times daily.   polyethylene glycol powder powder Commonly known as:  GLYCOLAX/MIRALAX Take 17 g by mouth daily.   pyridOXINE 50 MG tablet Commonly known as:  B-6 Take 50 mg by mouth daily.   tamsulosin 0.4 MG Caps capsule Commonly known as:  FLOMAX Take 0.4 mg by mouth daily. 30  min after the same meal, daily   traMADol 50 MG tablet Commonly known as:  ULTRAM Take 25 mg by mouth every 6 (six) hours as needed. Take 50 mg Q 6 hours for pain scale 6-10   TYLENOL EXTRA STRENGTH PO Take 500 mg by mouth.   VITAMIN B-12 IJ Inject 1 mL as directed every 30 (thirty) days.   vitamin C 500 MG tablet Commonly known as:  ASCORBIC ACID Take 500 mg by mouth daily.       Review of Systems  Constitutional: Positive for activity change and fatigue. Negative for appetite change,  chills, diaphoresis, fever and unexpected weight change.  Respiratory: Negative for cough, shortness of breath, wheezing and stridor.   Cardiovascular: Negative for chest pain, palpitations and leg swelling.  Gastrointestinal: Negative for abdominal distention, abdominal pain, constipation and diarrhea.  Genitourinary: Positive for frequency. Negative for difficulty urinating, dysuria and flank pain.  Musculoskeletal: Positive for arthralgias, back pain and gait problem. Negative for joint swelling and myalgias.  Neurological: Positive for weakness (general). Negative for dizziness, seizures, syncope, facial asymmetry, speech difficulty and headaches.  Hematological: Negative for adenopathy. Does not bruise/bleed easily.  Psychiatric/Behavioral: Positive for agitation and confusion. Negative for behavioral problems.    Immunization History  Administered Date(s) Administered  . Influenza,inj,Quad PF,36+ Mos 12/05/2015  . Influenza-Unspecified 11/08/2013  . Pneumococcal Conjugate-13 11/08/2013   Pertinent  Health Maintenance Due  Topic Date Due  . HEMOGLOBIN A1C  10-16-26  . FOOT EXAM  09/27/1936  . OPHTHALMOLOGY EXAM  09/27/1936  . URINE MICROALBUMIN  09/27/1936  . PNA vac Low Risk Adult (2 of 2 - PPSV23) 11/09/2014  . INFLUENZA VACCINE  Completed   Fall Risk  10/03/2015  Falls in the past year? No   Functional Status Survey:   Wt Readings from Last 3 Encounters:  01/10/16 113 lb  (51.3 kg)  12/26/15 113 lb 4.8 oz (51.4 kg)  12/19/15 116 lb 4.8 oz (52.8 kg)   Vitals:   01/10/16 1129  BP: (!) 104/58  Pulse: 60  Resp: (!) 22  Temp: 98 F (36.7 C)  SpO2: 95%  Weight: 113 lb (51.3 kg)   There is no height or weight on file to calculate BMI. Physical Exam  Constitutional: No distress.  HENT:  Head: Normocephalic and atraumatic.  Eyes: Pupils are equal, round, and reactive to light. Right eye exhibits no discharge. Left eye exhibits no discharge.  Cardiovascular: Normal rate.   No murmur heard. Irregular no edema  Pulmonary/Chest: Effort normal. No respiratory distress. He has no wheezes. He has rales (LL base).  Slight increase RR  Abdominal: Soft. Bowel sounds are normal. He exhibits no distension. There is no tenderness.  Musculoskeletal: He exhibits no edema or tenderness.  Grimaces with ambulation, kyphosis noted.   Neurological: He is alert. No cranial nerve deficit.  Oriented to self and place, not situation, or to time.  Sleepy, refuses to follow commands  Skin: Skin is warm and dry. He is not diaphoretic. There is pallor.  Very dry  Psychiatric:  Easily angered, keeps repeating himself  Nursing note and vitals reviewed.   Labs reviewed:  Recent Labs  11/08/15 0423 11/09/15 0539  11/10/15 7673  11/13/15 4193 11/14/15 0402 11/14/15 7902 11/15/15 0326  12/12/15 4097 12/19/15 0959 12/26/15 0921 01/02/16 0958  NA 136 137  < > 137  < > 141 141  --  143  < > 142 140 138 138  K 3.9 3.7  < > 4.0  < > 4.9 3.6  --  4.1  < > 4.1 5.1 4.3 4.7  CL 105 107  < > 113*  < > 118* 113*  --  119*  --   --   --   --   --   CO2 19* 19*  < > 16*  < > 17* 16*  --  17*  < > '24 25 25 25  '$ GLUCOSE 150* 108*  < > 168*  < > 165* 123*  --  128*  < > 151* 125 116 160*  BUN 48* 38*  < > 38*  < >  42* 36*  --  29*  < > 38.8* 47.1* 59.3* 46.6*  CREATININE 2.55* 2.43*  < > 2.21*  < > 2.12* 1.84*  --  1.73*  < > 1.7* 2.0* 2.2* 1.7*  CALCIUM 9.0 8.0*  < > 7.5*  < > 6.5*  6.4*  --  7.0*  < > 9.0 9.4 9.6 9.7  MG 1.4* 1.4*  --  2.1  --   --   --  2.0  --   --  1.9  --   --   --   PHOS 3.5 3.1  --  4.5  --   --   --   --   --   --   --   --   --   --   < > = values in this interval not displayed.  Recent Labs  11/08/15 0423 11/09/15 0208 11/22/15 1341 12/26/15 0921  AST 17  --  16 15  ALT 11*  --  22 8  ALKPHOS 82  --  128 174*  BILITOT 2.0*  --  1.02 0.70  PROT 5.7*  --  6.1* 8.0  ALBUMIN 2.5* 2.1* 2.4* 2.6*    Recent Labs  12/19/15 0959 12/26/15 0920 01/02/16 0958  WBC 2.4* 2.4* 2.2*  NEUTROABS 1.6 1.5 1.5  HGB 7.7* 7.2* 8.4*  HCT 23.2* 21.0* 24.9*  MCV 93.5 91.7 91.9  PLT 57* 49* 31*   Lab Results  Component Value Date   TSH 0.918 05/17/2014   No results found for: HGBA1C No results found for: CHOL, HDL, LDLCALC, LDLDIRECT, TRIG, CHOLHDL  Significant Diagnostic Results in last 30 days:  No results found.  Assessment/Plan  1. Delirium Due to UTI, living in rehab, and poor health, with underlying mild dementia see below  2. Memory loss May benefit from medication for this but he is not interested  3. Multiple myeloma not having achieved remission ([place]) Followed by oncology, treatment on hold I agree with discontinuing treatment due to his poor quality of life and chronic pain  4. Spinal stenosis, lumbar region, without neurogenic claudication Continue ultram as needed, improved pain control when he uses medication  5. Pancytopenia ([place]) Due to treatment for MM, continue to monitor  6. Controlled type 2 diabetes mellitus without complication, without long-term current use of insulin ([facility]) Continue tradjenta 5 mg qd  7. FTT (failure to thrive) in adult Refuses to take nutritional supplements per staff Overall prognosis appears poor due to frailty, MM, memory loss, frequent infections, and need for additional assistance with ADLs Will need to transition to skilled care  8. Acute cystitis without hematuria Ampicillin 25  mg QID for 7 days with Florastor 1 cap BID for 7 days   Family/ staff Communication: discussed with staff/ resident  Labs/tests ordered:  CBC BMP pending

## 2016-01-11 NOTE — Telephone Encounter (Signed)
Spoke with [place], RN at [facility]: scheduled pt for blood transfusion at [facility] today. She discussed with pt and son.  "Younger Dr. [name] says no to the blood transfusion." Appt canceled.  Informed her of appointment with Dr. Benay Spice on 11/15 at 31.

## 2016-01-14 ENCOUNTER — Other Ambulatory Visit: Payer: Self-pay | Admitting: *Deleted

## 2016-01-14 DIAGNOSIS — C9000 Multiple myeloma not having achieved remission: Secondary | ICD-10-CM

## 2016-01-15 ENCOUNTER — Encounter: Payer: Self-pay | Admitting: *Deleted

## 2016-01-15 ENCOUNTER — Telehealth: Payer: Self-pay | Admitting: Oncology

## 2016-01-15 DIAGNOSIS — C9000 Multiple myeloma not having achieved remission: Secondary | ICD-10-CM

## 2016-01-15 DIAGNOSIS — C61 Malignant neoplasm of prostate: Secondary | ICD-10-CM

## 2016-01-15 NOTE — Progress Notes (Signed)
Hospice referral called to Katharine Look at [facility] per request of patient's son via Dr. Benay Spice.  Dr. Benay Spice to remain attending physician for patient.  Pt is a DNR per order of Dr. Benay Spice.

## 2016-01-15 NOTE — Telephone Encounter (Signed)
lvm to inform pt son of 11/15 appt at 1130 am per LOS

## 2016-01-23 ENCOUNTER — Ambulatory Visit: Payer: Medicare Other | Admitting: Oncology

## 2016-01-23 ENCOUNTER — Other Ambulatory Visit: Payer: Medicare Other

## 2016-02-08 DEATH — deceased

## 2016-02-23 ENCOUNTER — Other Ambulatory Visit: Payer: Self-pay | Admitting: Nurse Practitioner

## 2016-08-20 ENCOUNTER — Encounter: Payer: Self-pay | Admitting: Oncology

## 2017-06-02 IMAGING — DX DG BONE SURVEY MET
9 of 10 series · 9 of 10 positions shown · non-contrast
Comparison: None in PACs

CLINICAL DATA: New diagnosis of myeloma, staging evaluation, the
patient reports back pain.

EXAM:
METASTATIC BONE SURVEY

[w cervical spine lat]
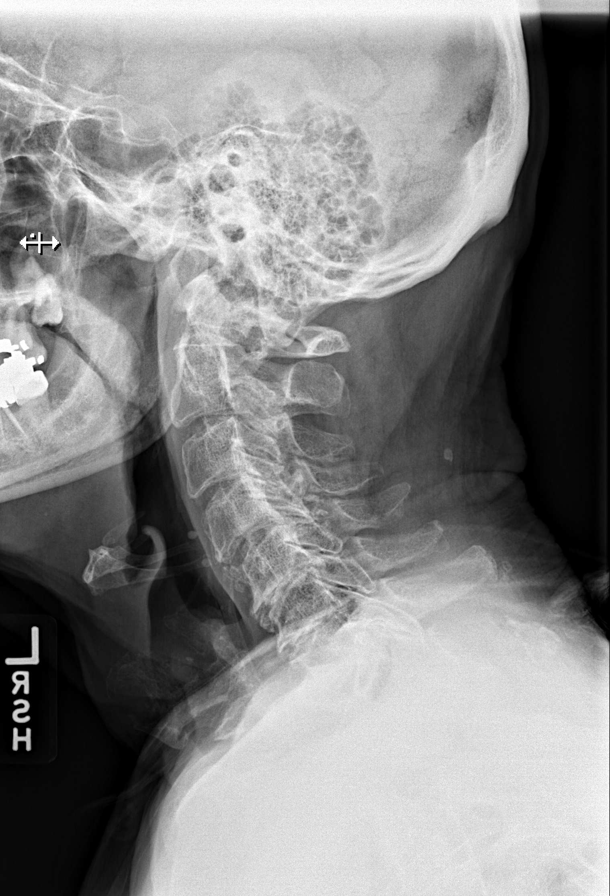

[w skull lat (1 of 2)]
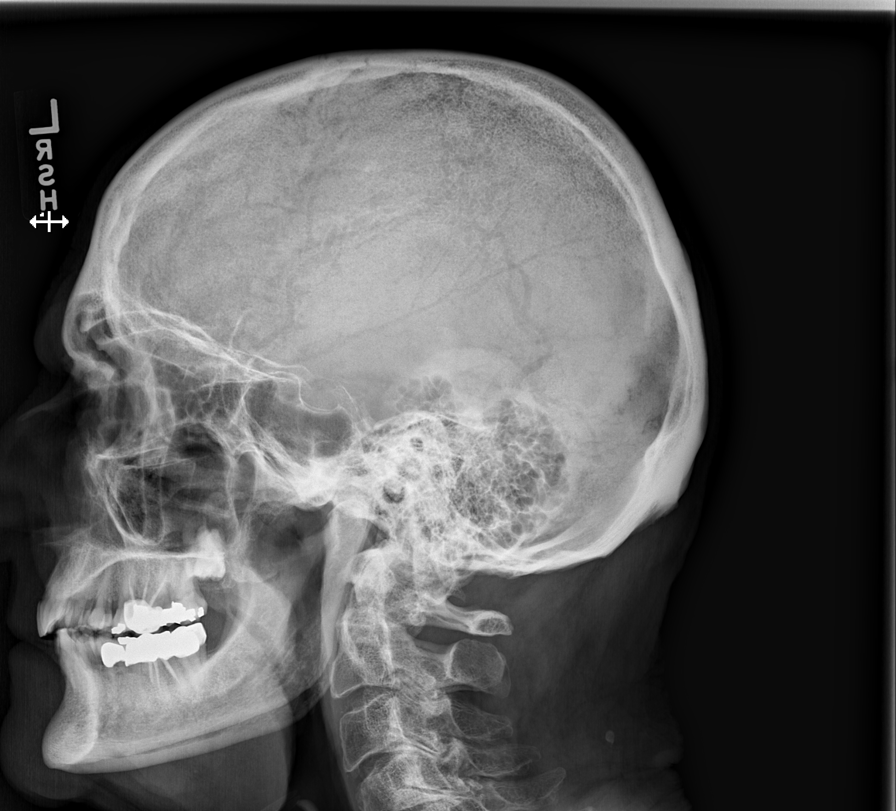

[w chest pa]
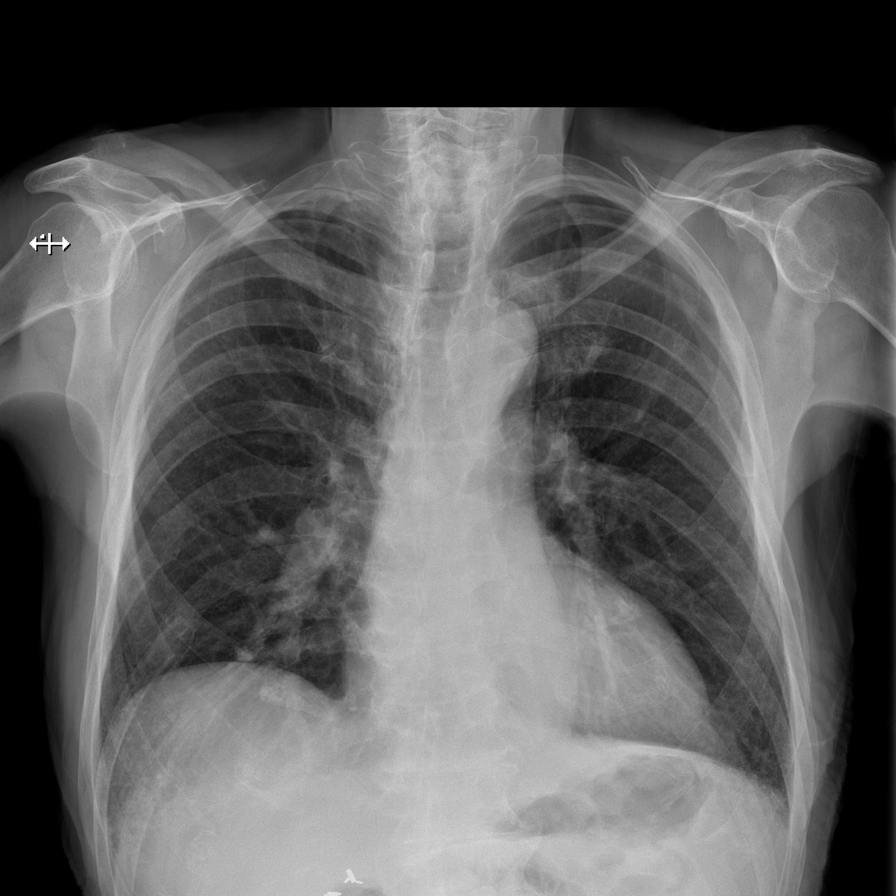

[w skull lat (2 of 2)]
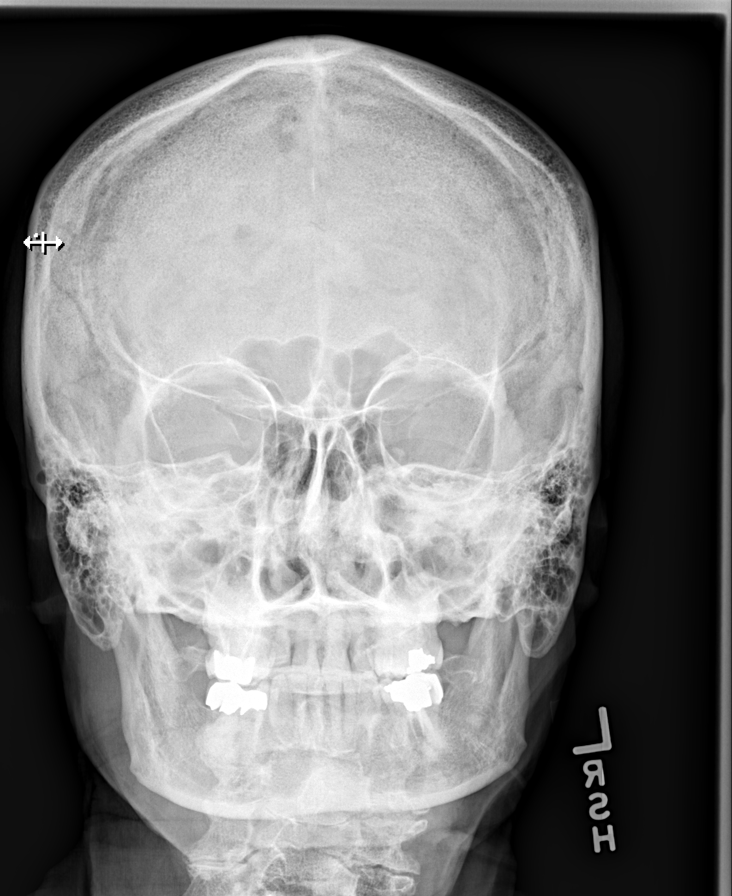

[t cervical spine ap]
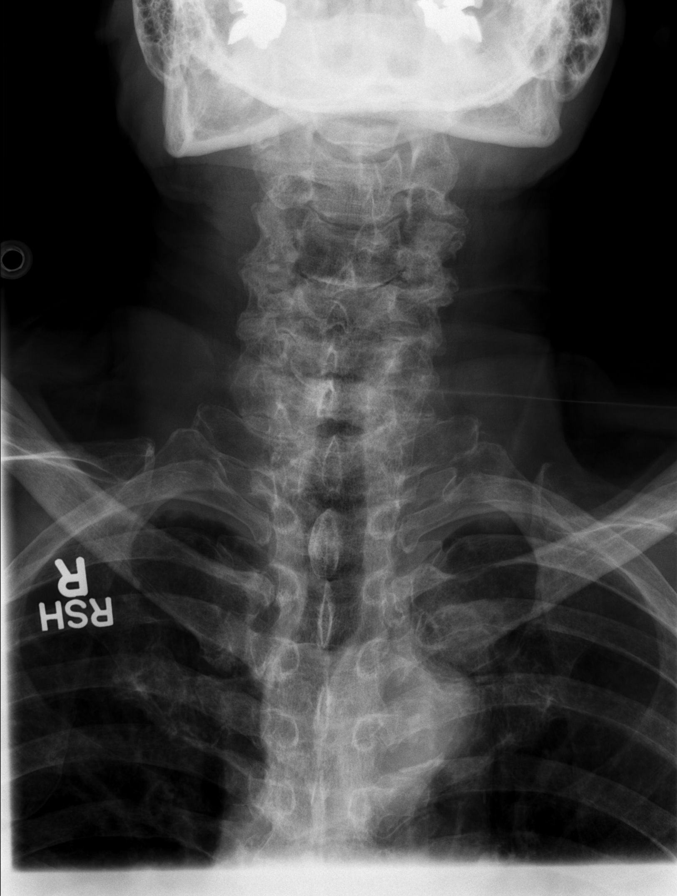

[t thoracic spine ap]
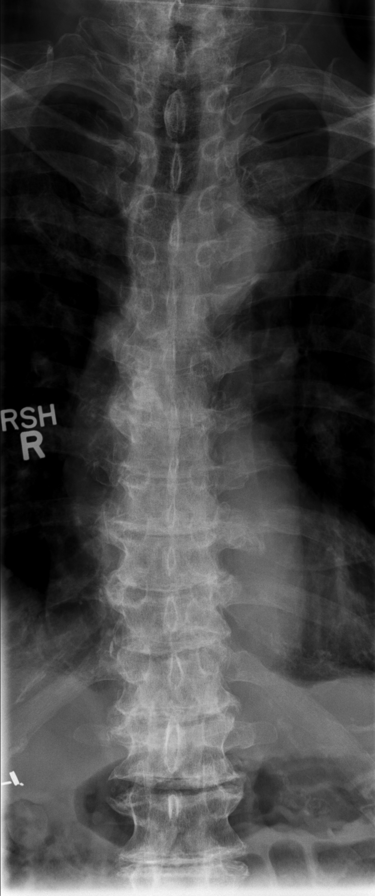

[t lumbar spine ap]
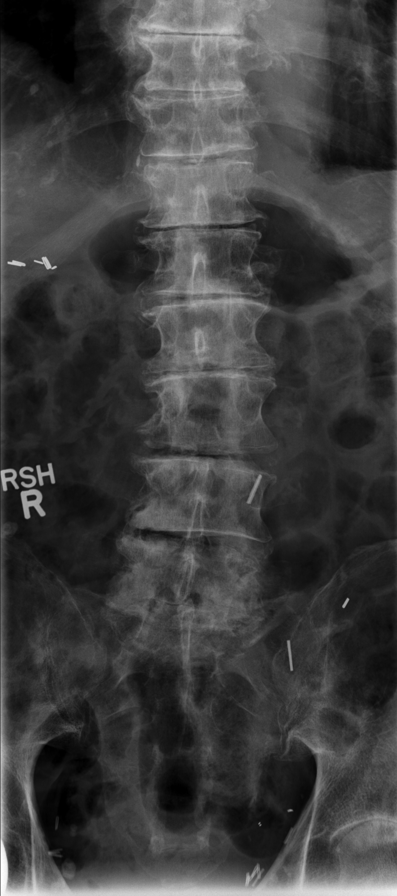

[t pelvis ap]
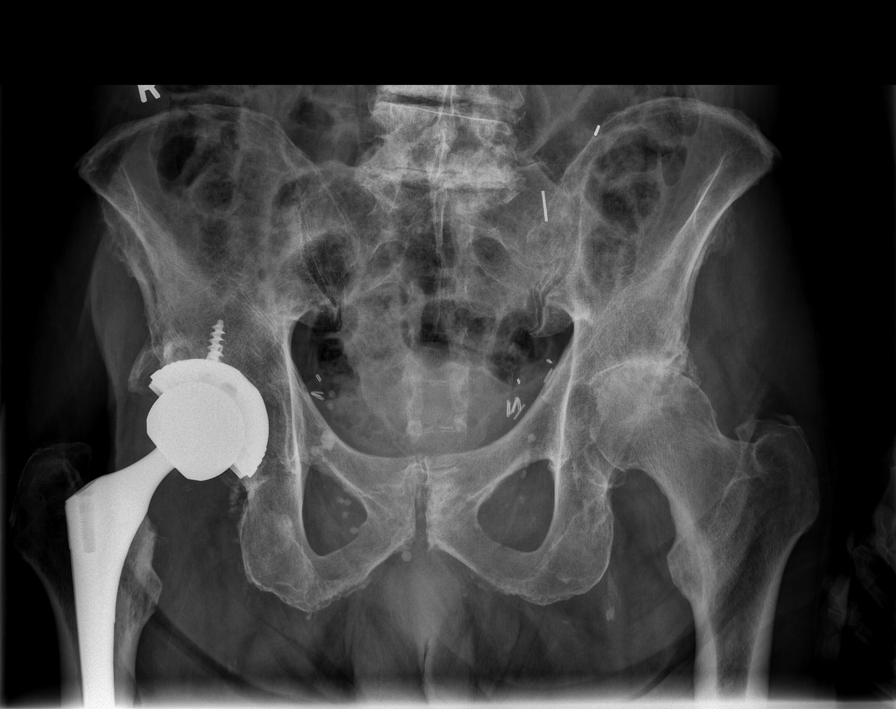

[t femur proximal ap left]
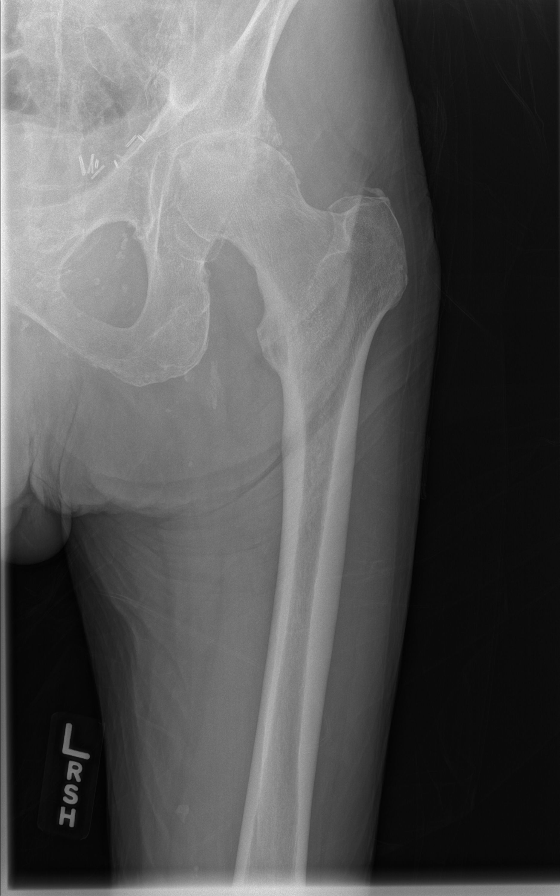

[9 of 10 positions shown; findings below may reference images not displayed]

FINDINGS: Skull and spine: The skull exhibits no lytic nor blastic lesion. The
cervical, thoracic, and lumbar spines exhibit multilevel
degenerative disc disease. There is no compression fracture. No
lytic nor blastic lesions are observed.

Chest and upper extremities: The lungs are adequately inflated and
clear. The heart and pulmonary vascularity are normal. There is
calcification in the wall of the aortic arch. The ribs and clavicles
exhibit no lytic nor blastic lesions. No lytic or blastic lesions in
the upper extremities are observed.

Pelvis and lower extremities: The bony pelvis is osteopenic. No
lytic or blastic lesion is observed. There is a prosthetic right hip
joint. There is severe degenerative change with bone on bone
appearance of the left hip. No lytic or blastic bony lesions are
observed.
IMPRESSION: 1. No skeletal changes to suggest myelomatous involvement.
2. Multilevel degenerative disc disease of the spine.
3. Severe degenerative change of the left hip joint with bone on
bone appearance. There is a prosthetic right hip joint.
